# Patient Record
Sex: Female | Born: 1945 | Race: White | Hispanic: No | Smoking: Former smoker
Health system: Southern US, Community
[De-identification: ages and names within clinical notes are randomized; demographics above are authoritative.]

## PROBLEM LIST (undated history)

## (undated) DIAGNOSIS — K429 Umbilical hernia without obstruction or gangrene: Secondary | ICD-10-CM

## (undated) DIAGNOSIS — O2230 Deep phlebothrombosis in pregnancy, unspecified trimester: Secondary | ICD-10-CM

## (undated) DIAGNOSIS — I10 Essential (primary) hypertension: Secondary | ICD-10-CM

## (undated) DIAGNOSIS — I509 Heart failure, unspecified: Secondary | ICD-10-CM

## (undated) DIAGNOSIS — R0602 Shortness of breath: Secondary | ICD-10-CM

## (undated) DIAGNOSIS — I2699 Other pulmonary embolism without acute cor pulmonale: Secondary | ICD-10-CM

## (undated) DIAGNOSIS — R918 Other nonspecific abnormal finding of lung field: Secondary | ICD-10-CM

## (undated) DIAGNOSIS — J159 Unspecified bacterial pneumonia: Secondary | ICD-10-CM

## (undated) DIAGNOSIS — E785 Hyperlipidemia, unspecified: Secondary | ICD-10-CM

## (undated) DIAGNOSIS — J310 Chronic rhinitis: Secondary | ICD-10-CM

## (undated) DIAGNOSIS — F419 Anxiety disorder, unspecified: Secondary | ICD-10-CM

## (undated) DIAGNOSIS — Z923 Personal history of irradiation: Secondary | ICD-10-CM

## (undated) DIAGNOSIS — G4710 Hypersomnia, unspecified: Secondary | ICD-10-CM

## (undated) DIAGNOSIS — D649 Anemia, unspecified: Secondary | ICD-10-CM

## (undated) DIAGNOSIS — I4891 Unspecified atrial fibrillation: Secondary | ICD-10-CM

## (undated) DIAGNOSIS — J189 Pneumonia, unspecified organism: Secondary | ICD-10-CM

## (undated) DIAGNOSIS — K219 Gastro-esophageal reflux disease without esophagitis: Secondary | ICD-10-CM

## (undated) DIAGNOSIS — J449 Chronic obstructive pulmonary disease, unspecified: Secondary | ICD-10-CM

## (undated) DIAGNOSIS — G4730 Sleep apnea, unspecified: Secondary | ICD-10-CM

## (undated) DIAGNOSIS — E119 Type 2 diabetes mellitus without complications: Secondary | ICD-10-CM

## (undated) HISTORY — DX: Sleep apnea, unspecified: G47.30

## (undated) HISTORY — DX: Other nonspecific abnormal finding of lung field: R91.8

## (undated) HISTORY — DX: Hyperlipidemia, unspecified: E78.5

## (undated) HISTORY — DX: Chronic rhinitis: J31.0

## (undated) HISTORY — DX: Type 2 diabetes mellitus without complications: E11.9

## (undated) HISTORY — DX: Deep phlebothrombosis in pregnancy, unspecified trimester: O22.30

## (undated) HISTORY — DX: Essential (primary) hypertension: I10

## (undated) HISTORY — DX: Hypersomnia, unspecified: G47.10

## (undated) HISTORY — DX: Chronic obstructive pulmonary disease, unspecified: J44.9

## (undated) HISTORY — PX: VAGINAL HYSTERECTOMY: SUR661

---

## 2012-11-11 HISTORY — PX: BRONCHOSCOPY: SUR163

## 2012-11-11 HISTORY — PX: OTHER SURGICAL HISTORY: SHX169

## 2013-03-17 ENCOUNTER — Encounter: Payer: Self-pay | Admitting: Cardiothoracic Surgery

## 2013-03-17 ENCOUNTER — Encounter: Payer: Self-pay | Admitting: *Deleted

## 2013-03-17 DIAGNOSIS — O2230 Deep phlebothrombosis in pregnancy, unspecified trimester: Secondary | ICD-10-CM | POA: Insufficient documentation

## 2013-03-17 DIAGNOSIS — J310 Chronic rhinitis: Secondary | ICD-10-CM | POA: Insufficient documentation

## 2013-03-17 DIAGNOSIS — R918 Other nonspecific abnormal finding of lung field: Secondary | ICD-10-CM | POA: Insufficient documentation

## 2013-03-17 DIAGNOSIS — I10 Essential (primary) hypertension: Secondary | ICD-10-CM | POA: Insufficient documentation

## 2013-03-17 DIAGNOSIS — E119 Type 2 diabetes mellitus without complications: Secondary | ICD-10-CM | POA: Insufficient documentation

## 2013-03-17 DIAGNOSIS — J441 Chronic obstructive pulmonary disease with (acute) exacerbation: Secondary | ICD-10-CM | POA: Insufficient documentation

## 2013-03-17 DIAGNOSIS — G4730 Sleep apnea, unspecified: Secondary | ICD-10-CM

## 2013-03-17 DIAGNOSIS — E785 Hyperlipidemia, unspecified: Secondary | ICD-10-CM | POA: Insufficient documentation

## 2013-03-17 DIAGNOSIS — G4710 Hypersomnia, unspecified: Secondary | ICD-10-CM | POA: Insufficient documentation

## 2013-03-18 ENCOUNTER — Encounter: Payer: Self-pay | Admitting: Cardiothoracic Surgery

## 2013-03-18 ENCOUNTER — Institutional Professional Consult (permissible substitution) (INDEPENDENT_AMBULATORY_CARE_PROVIDER_SITE_OTHER): Payer: Medicare Other | Admitting: Cardiothoracic Surgery

## 2013-03-18 ENCOUNTER — Other Ambulatory Visit: Payer: Self-pay | Admitting: *Deleted

## 2013-03-18 VITALS — BP 186/80 | HR 69 | Resp 18 | Ht 61.0 in | Wt 200.0 lb

## 2013-03-18 DIAGNOSIS — R918 Other nonspecific abnormal finding of lung field: Secondary | ICD-10-CM

## 2013-03-18 NOTE — Progress Notes (Addendum)
[address]       [place] [address]             [phone]                    Anne Sanders [facility] Record #[id number] Date of Birth: 1945/10/24  Referring: Dr Gardiner Rhyme Primary Care: Dr Wende Neighbors  Chief Complaint:    Chief Complaint  Patient presents with  . Lung Lesion    left upper lobe, ?right middle lobe lesio.Marland KitchenMarland KitchenCT CHEST 10/23/12 AND 03/07/13 AT [facility] AND PET 10/29/12 AT [facility], PFT'S    History of Present Illness:    Anne Sanders 68 y.o. female is seen in the office  today for an enlarging left upper lobe lung nodule. The patient has more than a 50 year history of smoking she quit 2 years ago. She spent many years working in [employer]. She's had  admissions for congestive heart failure and was assessed ablation of COPD. She's been on continuous home oxygen for approximately 10 years. She is currently on xarelto because of recent bilateral DVT. She has a history of pulmonary emboli more than 30 years ago.  During the summer of 2014 the patient was seen and treated for right lung pneumonia, this cleared on chest x-ray in late August, however because of persistent opacity in the right lung radiology recommended a CT scan. Because of a left lung nodule and right lung collapse a PET scan was done and bronchoscopy. No definitive tissue diagnosis was obtained. Followup CT scan in 3 months is recommended which the patient had in January 2015. She now is referred by Dr. Alcide Clever for further evaluation and treatment of her lung nodules.     Current Activity/ Functional Status:  Patient is independent with mobility/ambulation, transfers, ADL's, IADL's. patient lives alone, drives her own car. She is on continuous oxygen, is able to do some housework but with chronic dyspnea with minimal activity.  Zubrod Score: At the time of surgery this patient's most appropriate activity status/level should be described as: []  Normal activity, no  symptoms []  Symptoms, fully ambulatory [x]  Symptoms, in bed less than or equal to 50% of the time []  Symptoms, in bed greater than 50% of the time but less than 100% []  Bedridden []  Moribund   Past Medical History  Diagnosis Date  . Hypertension   . DVT (deep vein thrombosis) in pregnancy   . COPD (chronic obstructive pulmonary disease)   . Hyperlipidemia   . Sleep apnea with hypersomnolence   . Chronic rhinitis   . Lung nodules   . Diabetes mellitus without complication     Past Surgical History  Procedure Laterality Date  . Bronchoscopy  11/11/12    negative pathology of right kung mass...Dr. Alcide Clever  . Transbronschial biopsy  11/11/12    right lung mass...neg path..Dr Alcide Clever    Family History  Problem Relation Age of Onset  . Heart disease Mother     History   Social History  . Marital Status: Widowed    Spouse Name: N/A    Number of Children: N/A  . Years of Education: N/A   Occupational History  . Worked in [employer] for many years   Social History Main Topics  . Smoking status: Former Smoker -- 1.50 packs/day for 30 years    Types: Cigarettes    Start date: 03/17/1961    Quit date: 03/17/2012  . Smokeless tobacco: Not on file  Comment: HX OF EXPOSURE TO DUSTS OR FUMES AT WORK  . Alcohol Use: No  . Drug Use: Not on file  . Sexual Activity: Not on file     History  Smoking status  . Former Smoker -- 1.50 packs/day for 30 years  . Types: Cigarettes  . Start date: 03/17/1961  . Quit date: 03/17/2012  Smokeless tobacco  . Not on file    Comment: HX OF EXPOSURE TO DUSTS OR FUMES AT WORK    History  Alcohol Use No     No Known Allergies  Current Outpatient Prescriptions  Medication Sig Dispense Refill  . budesonide (PULMICORT) 0.5 MG/2ML nebulizer solution Take 0.5 mg by nebulization daily as needed (for wheezing).       Marland Kitchen buPROPion (WELLBUTRIN SR) 150 MG 12 hr tablet Take 150 mg by mouth 2 (two) times daily.      . digoxin (LANOXIN)  0.125 MG tablet Take 0.125 mg by mouth 2 (two) times daily.       . fenofibrate micronized (LOFIBRA) 200 MG capsule Take 200 mg by mouth daily before breakfast.      . gabapentin (NEURONTIN) 100 MG capsule Take 100 mg by mouth 2 (two) times daily.       Marland Kitchen glimepiride (AMARYL) 4 MG tablet Take 4 mg by mouth 2 (two) times daily.      Marland Kitchen ipratropium-albuterol (DUONEB) 0.5-2.5 (3) MG/3ML SOLN Take 3 mLs by nebulization every 6 (six) hours as needed (for shortness of breath).       . Melatonin 300 MCG TABS Take 1 tablet by mouth at bedtime as needed (for sleep).       . metoprolol succinate (TOPROL-XL) 100 MG 24 hr tablet Take 100 mg by mouth daily. Take with or immediately following a meal.      . Multiple Vitamin (MULTIVITAMIN) tablet Take 1 tablet by mouth daily.      Marland Kitchen omeprazole (PRILOSEC) 20 MG capsule Take 20 mg by mouth 2 (two) times daily before a meal.      . pravastatin (PRAVACHOL) 80 MG tablet Take 80 mg by mouth at bedtime.      . Rivaroxaban (XARELTO) 20 MG TABS tablet Take 20 mg by mouth daily with supper.      Marland Kitchen Fe-Succ-C-Thre-B12-Des Stomach (FERROVITE PO) Take 1 tablet by mouth 2 (two) times daily.      . insulin NPH Human (HUMULIN N,NOVOLIN N) 100 UNIT/ML injection Inject 45 Units into the skin daily.      Marland Kitchen lisinopril (PRINIVIL,ZESTRIL) 2.5 MG tablet Take 2.5 mg by mouth 2 (two) times daily.      Marland Kitchen LORazepam (ATIVAN) 0.5 MG tablet Take 0.5 mg by mouth 2 (two) times daily as needed for anxiety or sleep.      . potassium chloride SA (K-DUR,KLOR-CON) 20 MEQ tablet Take 20 mEq by mouth daily.       No current facility-administered medications for this visit.     REVIEW OF SYSTEMS:   Review of Systems:     Cardiac Review of Systems: Y or N  Chest Pain [    ]  Resting SOB [ y  ] Exertional SOB  Blue.Reese  ]  Orthopnea [ y ]   Pedal Edema [ severe  ]    Palpitations [n  ] Syncope  [n  ]   Presyncope [ n  ]  General Review of Systems: [Y] = yes [  ]=no Constitional: recent weight change  [n  ];  Wt loss over the last 3 months [  0 ] anorexia [  ]; fatigue [ y ]; nausea [ n ]; night sweats [ n ]; fever [ n ]; or chills [ n ];          Dental: poor dentition[dentures  ]; Last Dentist visit:   Eye : blurred vision [  ]; diplopia [   ]; vision changes [  ];  Amaurosis fugax[  ]; Resp: cough Blue.Reese  ];  wheezing[ y ];  hemoptysis[ y ]; shortness of breath[y  ]; paroxysmal nocturnal dyspnea[ y ]; dyspnea on exertion[  y]; or orthopnea[ y ];  GI:  gallstones[ n ], vomiting[ n ];  dysphagia[ n ]; melena[n  ];  hematochezia [n  ]; heartburn[ y ];   Hx of  Colonoscopy[  ]; GU: kidney stones [  ]; hematuria[n  ];   dysuria [  ];  nocturia[  ];  history of     obstruction [  ]; urinary frequency [  ]             Skin: rash, swelling[  ];, hair loss[  ];  peripheral edema[  ];  or itching[  ]; Musculosketetal: myalgias[  ];  joint swelling[  ];  joint erythema[  ];  joint pain[ y ];  back pain[y  ];  Heme/Lymph: bruising[ y ];  bleeding[y  ];  anemia[  ];  Neuro: TIA[ n ];  headaches[  ];  stroke[n  ];  vertigo[  ];  seizures[  ];   paresthesias[  ];  difficulty walking[ y ];  Psych:depression[  ]; anxiety[  ];  Endocrine: diabetes[  ];  thyroid dysfunction[  ];  Immunizations: Flu up to date [  ]; Pneumococcal up to date [  ];  Other: Patient and daughter note she has troubles with her memory and is currently on medication for this  Physical Exam: BP 186/80  Pulse 69  Resp 18  Ht 5\' 1"  (1.549 m)  Wt 200 lb (90.719 kg)  BMI 37.81 kg/m2  SpO2 94%  PHYSICAL EXAMINATION:   General appearance: alert, cooperative, appears older than stated age, moderately obese and The patient is on continuous oxygen and dyspneic at rest and  just moving short distance Neurologic: intact Heart: regular rate and rhythm, S1, S2 normal, no murmur, click, rub or gallop Lungs: diminished breath sounds bilaterally and wheezes bilaterally Abdomen: soft, non-tender; bowel sounds normal; no masses,  no  organomegaly Extremities: Both lower extremities were severely edematous right greater than left with severe venous stasis changes bilaterally and unhealed venous stasis ulceration especially in the right leg Patient has no carotid bruits, no cervical supraclavicular or axillary adenopathy   Diagnostic Studies & Laboratory data:     Recent Radiology Findings:     CLINICAL DATA: Followup pulmonary nodules 03/07/2013 EXAM: CT CHEST WITHOUT CONTRAST TECHNIQUE: Multidetector CT imaging of the chest was performed following the standard protocol without IV contrast. COMPARISON: 10/23/2012 and PET scan 10/29/2012 FINDINGS: Sagittal images of the spine shows osteopenia and mild degenerative changes thoracic spine.  Atherosclerotic calcifications of thoracic aorta and coronary arteries are noted. Low-density nodule right adrenal gland is stable in size in appearance from prior exam.  Previous airspace disease in anterior medial aspect of the right upper lobe has resolved There is decrease in size in nodule in right upper lobe anteriorly measures 8 mm on the prior exam measures 1.3 cm. There is new elongated nodular density in right  upper lobe laterally measures about 9 mm. Attention should be given on follow-up examination. A nodule in left lower lobe laterally measures 5 mm stable in size in appearance from prior exam. There is a spiculated nodule in left upper lobe centrally measures 1.7 by 1.5 cm. On the prior exam measures 1.2 x 1 cm. This is consistent with malignancy as demonstrated hypermetabolic activity on the prior PET scan.  Surgical construct or biopsy is recommended. A calcified granuloma or hamartoma in left lower lobe posteriorly is stable in size in appearance from prior exam. Small hiatal hernia. Stable left adrenal nodule. Precarinal lymph node measures 1 cm stable in size in appearance. No hilar adenopathy.  IMPRESSION: 1. There is improvement in nodule and  airspace disease in right upper lobe anterior medially. There is a new somewhat elongated nodule in right upper lobe laterally measures 9 mm. 2. No hilar adenopathy. Stable borderline precarinal lymph node. 3. There is progression in size of a spiculated nodule in left upper lobe centrally measures 1.5 by 1.7 cm. This is consistent with malignancy. Surgical consult or biopsy is recommended. These results were called by telephone at the time of interpretation on 03/07/2013 at 2:40 PM to Dr. Gardiner Rhyme MD, who verbally acknowledged these results. Electronically Signed By: Lahoma Crocker M.D. On: 03/07/2013 14:43   RADIOLOGY REPORT* 10/23/2012 Clinical Data: Lung nodule, chest swelling. CT CHEST WITHOUT CONTRAST Technique: Multidetector CT imaging of the chest was performed following the standard protocol without IV contrast. Comparison: Chest radiograph 10/14/2012 and CT chest 06/01/2005.  Findings: Small nodular densities in the thyroid measure less than 1 cm in size. Sub-centimeter thyroid nodule(s) noted, too small to characterize, but most likely benign in the absence of known clinical risk factors for thyroid carcinoma.  Mediastinal lymph nodes measure up to 11 mm in the low right paratracheal station. Hilar regions are difficult to definitively evaluate without IV contrast. No axillary adenopathy. Extensive atherosclerotic calcification of the arterial vasculature, including coronary arteries. Heart is enlarged. No pericardial effusion.  Note is made that respiratory motion degrades image quality. There is a nodular lesion in the anterior segment right upper lobe, measuring approximately 9 x 13 mm, with adjacent collapse/consolidation medially (series 4, images 19-25). Subpleural scarring in the lateral right lower lobe. Possible tiny nodular density along the right hemidiaphragm (image 43).  An irregular nodule is seen in the apical left upper lobe, measuring 10 x 12 mm, new  from 06/01/2005. A calcified subpleural lesion in the left lower lobe is unchanged. Scattered subpleural nodular densities are faint (example image 32) and measure less than 4 mm in size. No pleural fluid. Airway is unremarkable.  Incidental imaging of the upper abdomen shows adrenal nodules bilaterally, measuring up to 1.1 x 1.7 cm on the right and 1.7 x 2.1 cm on the left. These are low in attenuation and stable from 06/01/2005. No worrisome lytic or sclerotic lesions.  IMPRESSION:  1. Irregular nodules in the upper lobes bilaterally, worrisome for synchronous primary bronchogenic carcinomas. These results will be called to the ordering clinician or representative by the Radiologist Assistant, and communication documented in the PACS Dashboard. 2. Borderline enlarged low right paratracheal lymph node. 3. Additional scattered nonspecific pulmonary nodular densities are small in size and can be reassessed on follow-up examination. Original Report Authenticated By: Lorin Picket, M.D.    EXAM: NUCLEAR MEDICINE PET SKULL BASE TO THIGH FASTING BLOOD GLUCOSE: Value: 86 mg/dl TECHNIQUE: 13.7 mCi F-18 FDG was injected intravenously. CT data was obtained  and used for attenuation correction and anatomic localization only. (This was not acquired as a diagnostic CT examination.) Additional exam technical data entered on technologist worksheet. The PET images were only acquired through the thorax. Imaging of the abdomen and pelvis could not be completed due to patient claustrophobia. COMPARISON: CT chest 10/23/2012 FINDINGS: NECK No hypermetabolic lymph nodes in the neck. CHEST Within the left upper lobe there, is a hypermetabolic nodule measuring 15 mm (image 36). This nodule has intense metabolic activity with SUV max = 8.9. There is a focus of consolidation within the medial aspect of the right middle lobe measuring 5.7 x 3.8 cm. Within the central portions consolidation there is  a focus of intense metabolic activity with this had SUV max 8.1(image 69 of the CT series and 56 fused series).. Within the left lower lobe is calcified nodule (image 131) measuring 14 mm which is not hypermetabolic.  Within the mediastinum there is a mildly enlarged prevascular lymph node measuring 13 mm (image 61) with mild metabolic activity equal or less than blood which is indeterminate. .  ABDOMEN/PELVIS  No PET data is available for the abdomen or pelvis. There is enlargement of the left adrenal gland to 23 mm. This nodule has low Hounsfield units on comparison CT consistent with a benign adenoma.  SKELETON  No evidence of hypermetabolic skeletal metastasis in the thorax. No evidence of sclerotic lesions in the abdomen or pelvis.  IMPRESSION: 1. Hypermetabolic left upper lobe pulmonary nodule is concerning for bronchogenic carcinoma. This may be amenable to percutaneous biopsy. 2. Consolidation within the right middle lobe. Differential includes infection versus neoplasm or a combination of both. This may be amenable to percutaneous biopsy or bronchoscopy. 3. Mild enlarged minimally hypermetabolic pre carinal lymph node is indeterminate and may well be reactive . 4. Left adrenal adenoma. 5. No metabolic FDG data is provided for the abdomen or pelvis. No gross evidence of metastasis on CT portion. Electronically Signed By: Suzy Bouchard M.D. On: 10/30/2012 11:03  Recent Lab Findings: No results found for this basename: WBC, HGB, HCT, PLT, GLUCOSE, CHOL, TRIG, HDL, LDLDIRECT, LDLCALC, ALT, AST, NA, K, CL, CREATININE, BUN, CO2, TSH, INR, GLUF, HGBA1C   PFTs dated September 2014 FEV1 1.4 63% predicted DLCO 7.5 735% predicted Serial PFT   2009 -2011 show a FEV1 between 1.1 and 0.84 and DLCO 4.82-3.04  Assessment / Plan:     1.) spiculated nodule in left upper lobe centrally measures 1.7 by 1.5 cm enlarged slightly since September but definitely more dense  radiographically, highly suspicious for malignancy. The collapse in the right middle lobe has improved on followup scans but there are nodularities in the right chest there too small to characterize. 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 With her poor medical condition and severe COPD oxygen dependent I will not think she will be a resection candidate. I recommended to her we obtain her most recent CT scan data and consider bronchoscopy with ebus and electromagnetic navigation bronchoscopy with biopsy to obtain diagnosis. If tissue diagnosis confirms our clinical suspicion she may be a candidate for stereotactic radio therapy treatment. Patient is agreeable with this  2.) Currently on anticoagulation with xarelto because of severe venous stasis changes and recent right leg DVT, with history of recent DVT and severe venous stasis changes in the  lower extremity we'll plan to stop her xarelto with a Lovenox bridge pending surgical biopsy.  3 )Severe COPD on home oxygen and limited exercise tolerance    I spent 60 minutes counseling the patient face to face. The total time spent in the appointment was 80 minutes. Portions of this note have been generated with the assistance of voice recognition software. I apologize for any typographical/grammatical errors that may be present in this note as it has not been reviewed by a professional transcriptionist.     Grace Isaac MD      [address] [place] [address] Office [phone]   Beeper [phone]  03/19/2013 1:37 PM

## 2013-03-19 ENCOUNTER — Encounter (HOSPITAL_COMMUNITY): Payer: Self-pay | Admitting: Pharmacy Technician

## 2013-03-20 ENCOUNTER — Other Ambulatory Visit (HOSPITAL_COMMUNITY): Payer: Self-pay | Admitting: *Deleted

## 2013-03-20 ENCOUNTER — Other Ambulatory Visit: Payer: Self-pay | Admitting: *Deleted

## 2013-03-20 ENCOUNTER — Encounter (HOSPITAL_COMMUNITY): Payer: Self-pay

## 2013-03-20 ENCOUNTER — Encounter (HOSPITAL_COMMUNITY)
Admission: RE | Admit: 2013-03-20 | Discharge: 2013-03-20 | Disposition: A | Discharge status: Home / Self Care | Payer: Medicare Other | Source: Ambulatory Visit | Attending: Cardiothoracic Surgery | Admitting: Cardiothoracic Surgery

## 2013-03-20 VITALS — BP 165/82 | HR 70 | Temp 97.2°F | Resp 18 | Wt 200.6 lb

## 2013-03-20 DIAGNOSIS — R918 Other nonspecific abnormal finding of lung field: Secondary | ICD-10-CM

## 2013-03-20 DIAGNOSIS — Z86718 Personal history of other venous thrombosis and embolism: Secondary | ICD-10-CM

## 2013-03-20 DIAGNOSIS — Z01810 Encounter for preprocedural cardiovascular examination: Secondary | ICD-10-CM | POA: Insufficient documentation

## 2013-03-20 DIAGNOSIS — Z01818 Encounter for other preprocedural examination: Secondary | ICD-10-CM | POA: Insufficient documentation

## 2013-03-20 DIAGNOSIS — Z01812 Encounter for preprocedural laboratory examination: Secondary | ICD-10-CM | POA: Insufficient documentation

## 2013-03-20 HISTORY — DX: Anxiety disorder, unspecified: F41.9

## 2013-03-20 HISTORY — DX: Pneumonia, unspecified organism: J18.9

## 2013-03-20 HISTORY — DX: Unspecified atrial fibrillation: I48.91

## 2013-03-20 HISTORY — DX: Gastro-esophageal reflux disease without esophagitis: K21.9

## 2013-03-20 HISTORY — DX: Anemia, unspecified: D64.9

## 2013-03-20 HISTORY — DX: Shortness of breath: R06.02

## 2013-03-20 HISTORY — DX: Umbilical hernia without obstruction or gangrene: K42.9

## 2013-03-20 HISTORY — DX: Heart failure, unspecified: I50.9

## 2013-03-20 HISTORY — DX: Other pulmonary embolism without acute cor pulmonale: I26.99

## 2013-03-20 LAB — COMPREHENSIVE METABOLIC PANEL
ALT: 19 U/L (ref 0–35)
AST: 22 U/L (ref 0–37)
Albumin: 3.2 g/dL — ABNORMAL LOW (ref 3.5–5.2)
Alkaline Phosphatase: 66 U/L (ref 39–117)
BUN: 37 mg/dL — ABNORMAL HIGH (ref 6–23)
CO2: 29 mEq/L (ref 19–32)
Calcium: 9.2 mg/dL (ref 8.4–10.5)
Chloride: 96 mEq/L (ref 96–112)
Creatinine, Ser: 1.46 mg/dL — ABNORMAL HIGH (ref 0.50–1.10)
GFR calc Af Amer: 42 mL/min — ABNORMAL LOW (ref >90)
GFR calc non Af Amer: 36 mL/min — ABNORMAL LOW (ref >90)
Glucose, Bld: 322 mg/dL — ABNORMAL HIGH (ref 70–99)
Potassium: 4.7 mEq/L (ref 3.7–5.3)
Sodium: 138 mEq/L (ref 137–147)
Total Bilirubin: 0.3 mg/dL (ref 0.3–1.2)
Total Protein: 7.6 g/dL (ref 6.0–8.3)

## 2013-03-20 LAB — CBC
HCT: 35.9 % — ABNORMAL LOW (ref 36.0–46.0)
Hemoglobin: 11.6 g/dL — ABNORMAL LOW (ref 12.0–15.0)
MCH: 28.4 pg (ref 26.0–34.0)
MCHC: 32.3 g/dL (ref 30.0–36.0)
MCV: 88 fL (ref 78.0–100.0)
Platelets: 130 10*3/uL — ABNORMAL LOW (ref 150–400)
RBC: 4.08 MIL/uL (ref 3.87–5.11)
RDW: 15.8 % — ABNORMAL HIGH (ref 11.5–15.5)
WBC: 6.9 10*3/uL (ref 4.0–10.5)

## 2013-03-20 LAB — PROTIME-INR
INR: 1.33 (ref 0.00–1.49)
Prothrombin Time: 16.2 seconds — ABNORMAL HIGH (ref 11.6–15.2)

## 2013-03-20 LAB — APTT: aPTT: 36 seconds (ref 24–37)

## 2013-03-20 MED ORDER — ENOXAPARIN SODIUM 150 MG/ML ~~LOC~~ SOLN: 130.0000 mg | SUBCUTANEOUS | Status: DC

## 2013-03-20 NOTE — Pre-Procedure Instructions (Signed)
Anne Sanders  03/20/2013   Your procedure is scheduled on:  Tuesday, March 25, 2013 at 12:10 PM.   Report to [facility] Entrance "A" Admitting Office at 8:30 AM.   Call this number if you have problems the morning of surgery: [phone]   Remember:   Do not eat food or drink liquids after midnight Monday, 03/24/13.   Take these medicines the morning of surgery with A SIP OF WATER: buPROPion (WELLBUTRIN SR), digoxin (LANOXIN), gabapentin (NEURONTIN), omeprazole (PRILOSEC), metoprolol succinate (TOPROL-XL), LORazepam (ATIVAN) - if needed, ipratropium-albuterol (DUONEB) - if needed, budesonide (PULMICORT) - if needed (please bring inhaler with you day of surgery).  Stop all Vitamins, Herbal Medications as of today, 03/21/13. Follow physician's instructions on Xarelto and Lovenox.  Do not take any of your diabetic medicines the day of surgery.     Do not wear jewelry, make-up or nail polish.  Do not wear lotions, powders, or perfumes. You may wear deodorant.  Do not shave 48 hours prior to surgery.   Do not bring valuables to the hospital.  [facility] is not responsible                  for any belongings or valuables.               Contacts, dentures or bridgework may not be worn into surgery.  Leave suitcase in the car. After surgery it may be brought to your room.  For patients admitted to the hospital, discharge time is determined by your                treatment team.   Will Need to have someone drive you home after your procedure. You are not allowed to drive home.           Special Instructions: Shower using CHG 2 nights before surgery and the night before surgery.  If you shower the day of surgery use CHG.  Use special wash - you have one bottle of CHG for all showers.  You should use approximately 1/3 of the bottle for each shower.   Please read over the following fact sheets that you were given: Pain Booklet, Coughing and Deep Breathing and Surgical Site Infection  Prevention

## 2013-03-20 NOTE — Progress Notes (Signed)
Pt's daughter states that she had just received information about pt stopping Xarelto and starting a Lovenox bridge. I instructed her to follow the physician's orders for that.

## 2013-03-21 ENCOUNTER — Encounter (HOSPITAL_COMMUNITY): Payer: Self-pay

## 2013-03-21 NOTE — Progress Notes (Signed)
Anesthesia Chart Review:  Patient is a 68 year old female scheduled for video bronchoscopy with endobronchial navigation on 03/25/13 by Dr. Servando Snare. During the summer of 2014 the patient was seen and treated for right lung pneumonia, this cleared on chest x-ray in late August, however because of persistent opacity in the right lung radiology recommended a CT scan. Because of a left lung nodule and right lung collapse a PET scan was done and bronchoscopy. No definitive tissue diagnosis was obtained. Followup CT scan in 3 months is recommended which the patient had in January 2015. She was then referred to [facility] by Dr. Alcide Clever for further evaluation and treatment of her lung nodules.  Other history includes former smoker, bilateral DVT '14, remote history of PE, obesity,  HTN, HLD, OSA, DM2, COPD with home O2, anxiety, CHF, GERD, anemia. She also has a history of afib according to her PCP notes, although she was not aware of this.  PCP is Dr. Wende Neighbors with [facility]  Pulmonologist is Dr. Gardiner Rhyme.    EKG on 03/20/13 showed afib @ 63 bpm, non-specific intra-ventricular conduction block, cannot rule out anteroseptal infarct (age undetermined), T wave abnormality, consider inferior ischemia.  IVCD is new and inferior T wave abnormality is more pronounced when compared to EKG on 12/25/11 which also showed afib.  She denied prior stress and echo, and there was no record of these at [facility].  However, I did receive records from [facility] that showed a cardiology evaluation by Dr. Jyl Heinz on 09/18/11 for afib in the setting of sepsis (positive blood cultures).  She had an echo on 09/18/11 that showed undetermined rhythm, LV size, wall thickness and systolic function are normal with EF greater than 55%. RV is mildly enlarged. LV is mildly dilated. Mild aortic valve sclerosis without stenosis. Trace to mild mitral regurgitation. Mild to moderate tricuspid regurgitation. Mild to moderate  pulmonary hypertension. Trivial pericardial effusion.   By [facility] records, PFTs dated September 2014 FEV1 1.4 63% predicted DLCO 7.5 735% predicted.   Preoperative labs noted.  BUN/Cr 37/1.46.  Glucose is 322.  H/H 11.6/35.9, PLT 130K.  PT 16.2, INR 1.33, PTT 36.    She is for a CXR on the day of surgery.  Due to her recent history of DVT and severe stasis changes in her LE, Dr. Servando Snare is having her start a Lovenox bridge while Xarelto is on hold for surgery. Chart reviewed with Dr. Servando Snare (since she did not report history of afib to him) and anesthesiologist Dr. Tobias Alexander.  It's unclear if patient has been in afib since 08/2011 or if this is recurrent.  She is on digoxin and Toprol with good rate control.  She has been on Xarelto and will be on a Lovenox bridge preoperatively.  Echo was done at the time of her initial diagnosis of afib.  She will get a fasting CBG on arrival.  Dr. Servando Snare does not feel patient will be a candidate for larger procedure like thoracotomy.  She tolerated previous bronchoscopy with biopsy in [place], but unfortunately no definite tissue diagnosis.  If glucose is reasonable and afib remains rate controlled it is anticipated that she can proceed as planned.  George Hugh [facility] Short Stay Center/Anesthesiology Phone [phone] 03/21/2013 3:14 PM

## 2013-03-24 ENCOUNTER — Telehealth: Payer: Self-pay | Admitting: *Deleted

## 2013-03-24 NOTE — Telephone Encounter (Signed)
Patient's daughter called to see what time she needed to bring her mom to the hospital for surgery tomorrow (2/3).  I let her know to be there at 8:30am.  No further questions.  Told her to call our office if anything came up.

## 2013-03-24 NOTE — Progress Notes (Signed)
Pt notified of time change;to arrive at 0730-verbalized understanding

## 2013-03-25 ENCOUNTER — Encounter (HOSPITAL_COMMUNITY): Payer: Medicare Other | Admitting: Vascular Surgery

## 2013-03-25 ENCOUNTER — Encounter (HOSPITAL_COMMUNITY): Payer: Self-pay | Admitting: *Deleted

## 2013-03-25 ENCOUNTER — Ambulatory Visit (HOSPITAL_COMMUNITY): Payer: Medicare Other

## 2013-03-25 ENCOUNTER — Ambulatory Visit (HOSPITAL_COMMUNITY): Payer: Medicare Other | Admitting: Anesthesiology

## 2013-03-25 ENCOUNTER — Ambulatory Visit (HOSPITAL_COMMUNITY)
Admission: RE | Admit: 2013-03-25 | Discharge: 2013-03-25 | Disposition: A | Discharge status: Home / Self Care | Payer: Medicare Other | Source: Ambulatory Visit | Attending: Cardiothoracic Surgery | Admitting: Cardiothoracic Surgery

## 2013-03-25 ENCOUNTER — Encounter (HOSPITAL_COMMUNITY)
Admission: RE | Disposition: A | Discharge status: Home / Self Care | Payer: Self-pay | Source: Ambulatory Visit | Attending: Cardiothoracic Surgery

## 2013-03-25 DIAGNOSIS — R918 Other nonspecific abnormal finding of lung field: Secondary | ICD-10-CM

## 2013-03-25 DIAGNOSIS — K219 Gastro-esophageal reflux disease without esophagitis: Secondary | ICD-10-CM | POA: Insufficient documentation

## 2013-03-25 DIAGNOSIS — Z9981 Dependence on supplemental oxygen: Secondary | ICD-10-CM | POA: Insufficient documentation

## 2013-03-25 DIAGNOSIS — D381 Neoplasm of uncertain behavior of trachea, bronchus and lung: Secondary | ICD-10-CM

## 2013-03-25 DIAGNOSIS — E119 Type 2 diabetes mellitus without complications: Secondary | ICD-10-CM | POA: Insufficient documentation

## 2013-03-25 DIAGNOSIS — F411 Generalized anxiety disorder: Secondary | ICD-10-CM | POA: Insufficient documentation

## 2013-03-25 DIAGNOSIS — J449 Chronic obstructive pulmonary disease, unspecified: Secondary | ICD-10-CM | POA: Insufficient documentation

## 2013-03-25 DIAGNOSIS — I509 Heart failure, unspecified: Secondary | ICD-10-CM | POA: Insufficient documentation

## 2013-03-25 DIAGNOSIS — N289 Disorder of kidney and ureter, unspecified: Secondary | ICD-10-CM | POA: Insufficient documentation

## 2013-03-25 DIAGNOSIS — Z7901 Long term (current) use of anticoagulants: Secondary | ICD-10-CM | POA: Insufficient documentation

## 2013-03-25 DIAGNOSIS — C3410 Malignant neoplasm of upper lobe, unspecified bronchus or lung: Secondary | ICD-10-CM | POA: Insufficient documentation

## 2013-03-25 DIAGNOSIS — E785 Hyperlipidemia, unspecified: Secondary | ICD-10-CM | POA: Insufficient documentation

## 2013-03-25 DIAGNOSIS — Z86711 Personal history of pulmonary embolism: Secondary | ICD-10-CM | POA: Insufficient documentation

## 2013-03-25 DIAGNOSIS — G4730 Sleep apnea, unspecified: Secondary | ICD-10-CM | POA: Insufficient documentation

## 2013-03-25 DIAGNOSIS — J4489 Other specified chronic obstructive pulmonary disease: Secondary | ICD-10-CM | POA: Insufficient documentation

## 2013-03-25 DIAGNOSIS — I10 Essential (primary) hypertension: Secondary | ICD-10-CM | POA: Insufficient documentation

## 2013-03-25 DIAGNOSIS — Z87891 Personal history of nicotine dependence: Secondary | ICD-10-CM | POA: Insufficient documentation

## 2013-03-25 DIAGNOSIS — Z77098 Contact with and (suspected) exposure to other hazardous, chiefly nonmedicinal, chemicals: Secondary | ICD-10-CM | POA: Insufficient documentation

## 2013-03-25 DIAGNOSIS — Z86718 Personal history of other venous thrombosis and embolism: Secondary | ICD-10-CM | POA: Insufficient documentation

## 2013-03-25 HISTORY — PX: VIDEO BRONCHOSCOPY WITH ENDOBRONCHIAL NAVIGATION: SHX6175

## 2013-03-25 HISTORY — PX: VIDEO BRONCHOSCOPY WITH ENDOBRONCHIAL ULTRASOUND: SHX6177

## 2013-03-25 LAB — GLUCOSE, CAPILLARY
Glucose-Capillary: 195 mg/dL — ABNORMAL HIGH (ref 70–99)
Glucose-Capillary: 197 mg/dL — ABNORMAL HIGH (ref 70–99)
Glucose-Capillary: 182 mg/dL — ABNORMAL HIGH (ref 70–99)
Glucose-Capillary: 151 mg/dL — ABNORMAL HIGH (ref 70–99)
Glucose-Capillary: 166 mg/dL — ABNORMAL HIGH (ref 70–99)

## 2013-03-25 SURGERY — VIDEO BRONCHOSCOPY WITH ENDOBRONCHIAL NAVIGATION
Anesthesia: General

## 2013-03-25 MED ORDER — PHENYLEPHRINE 40 MCG/ML (10ML) SYRINGE FOR IV PUSH (FOR BLOOD PRESSURE SUPPORT)
PREFILLED_SYRINGE | INTRAVENOUS | Status: AC
  Filled 2013-03-25: qty 10

## 2013-03-25 MED ORDER — LIDOCAINE HCL (CARDIAC) 20 MG/ML IV SOLN
INTRAVENOUS | Status: AC
  Filled 2013-03-25: qty 5

## 2013-03-25 MED ORDER — LIDOCAINE HCL (CARDIAC) 20 MG/ML IV SOLN
INTRAVENOUS | Status: DC | PRN
  Administered 2013-03-25: 60 mg via INTRAVENOUS

## 2013-03-25 MED ORDER — LACTATED RINGERS IV SOLN
INTRAVENOUS | Status: DC
  Administered 2013-03-25 (×2): via INTRAVENOUS

## 2013-03-25 MED ORDER — MIDAZOLAM HCL 2 MG/2ML IJ SOLN
INTRAMUSCULAR | Status: AC
  Filled 2013-03-25: qty 2

## 2013-03-25 MED ORDER — SUCCINYLCHOLINE CHLORIDE 20 MG/ML IJ SOLN
INTRAMUSCULAR | Status: DC | PRN
  Administered 2013-03-25: 120 mg via INTRAVENOUS

## 2013-03-25 MED ORDER — PROPOFOL 10 MG/ML IV BOLUS
INTRAVENOUS | Status: AC
Start: 2013-03-25 — End: 2013-03-25
  Filled 2013-03-25: qty 20

## 2013-03-25 MED ORDER — ONDANSETRON HCL 4 MG/2ML IJ SOLN
INTRAMUSCULAR | Status: AC
  Administered 2013-03-25: 4 mg via INTRAVENOUS
  Filled 2013-03-25: qty 2

## 2013-03-25 MED ORDER — FENTANYL CITRATE 0.05 MG/ML IJ SOLN
INTRAMUSCULAR | Status: DC | PRN
  Administered 2013-03-25: 50 ug via INTRAVENOUS

## 2013-03-25 MED ORDER — ROCURONIUM BROMIDE 50 MG/5ML IV SOLN
INTRAVENOUS | Status: AC
  Filled 2013-03-25: qty 1

## 2013-03-25 MED ORDER — GLYCOPYRROLATE 0.2 MG/ML IJ SOLN
INTRAMUSCULAR | Status: DC | PRN
  Administered 2013-03-25: 0.4 mg via INTRAVENOUS

## 2013-03-25 MED ORDER — MIDAZOLAM HCL 5 MG/5ML IJ SOLN
INTRAMUSCULAR | Status: DC | PRN
  Administered 2013-03-25: 1 mg via INTRAVENOUS

## 2013-03-25 MED ORDER — ONDANSETRON HCL 4 MG/2ML IJ SOLN
INTRAMUSCULAR | Status: AC
  Filled 2013-03-25: qty 2

## 2013-03-25 MED ORDER — ROCURONIUM BROMIDE 100 MG/10ML IV SOLN
INTRAVENOUS | Status: DC | PRN
  Administered 2013-03-25: 20 mg via INTRAVENOUS

## 2013-03-25 MED ORDER — FENTANYL CITRATE 0.05 MG/ML IJ SOLN
INTRAMUSCULAR | Status: AC
  Filled 2013-03-25: qty 5

## 2013-03-25 MED ORDER — FENTANYL CITRATE 0.05 MG/ML IJ SOLN: 25.0000 ug | INTRAMUSCULAR | Status: DC | PRN

## 2013-03-25 MED ORDER — OXYCODONE HCL 5 MG/5ML PO SOLN: 5.0000 mg | Freq: Once | ORAL | Status: DC | PRN

## 2013-03-25 MED ORDER — PROPOFOL 10 MG/ML IV BOLUS
INTRAVENOUS | Status: DC | PRN
  Administered 2013-03-25: 120 mg via INTRAVENOUS
  Administered 2013-03-25: 20 mg via INTRAVENOUS

## 2013-03-25 MED ORDER — NEOSTIGMINE METHYLSULFATE 1 MG/ML IJ SOLN
INTRAMUSCULAR | Status: DC | PRN
  Administered 2013-03-25: 3 mg via INTRAVENOUS

## 2013-03-25 MED ORDER — SUCCINYLCHOLINE CHLORIDE 20 MG/ML IJ SOLN
INTRAMUSCULAR | Status: AC
  Filled 2013-03-25: qty 1

## 2013-03-25 MED ORDER — OXYCODONE HCL 5 MG PO TABS: 5.0000 mg | ORAL_TABLET | Freq: Once | ORAL | Status: DC | PRN

## 2013-03-25 MED ORDER — PHENYLEPHRINE HCL 10 MG/ML IJ SOLN
INTRAMUSCULAR | Status: DC | PRN
  Administered 2013-03-25: 80 ug via INTRAVENOUS

## 2013-03-25 MED ORDER — 0.9 % SODIUM CHLORIDE (POUR BTL) OPTIME
TOPICAL | Status: DC | PRN
  Administered 2013-03-25: 1000 mL

## 2013-03-25 MED ORDER — DEXTROSE 5 % IV SOLN
1.5000 g | INTRAVENOUS | Status: AC
  Administered 2013-03-25: 1.5 g via INTRAVENOUS
  Filled 2013-03-25: qty 1.5

## 2013-03-25 MED ORDER — ONDANSETRON HCL 4 MG/2ML IJ SOLN
INTRAMUSCULAR | Status: DC | PRN
  Administered 2013-03-25: 4 mg via INTRAVENOUS

## 2013-03-25 MED ORDER — NEOSTIGMINE METHYLSULFATE 1 MG/ML IJ SOLN
INTRAMUSCULAR | Status: AC
  Filled 2013-03-25: qty 10

## 2013-03-25 MED ORDER — GLYCOPYRROLATE 0.2 MG/ML IJ SOLN
INTRAMUSCULAR | Status: AC
  Filled 2013-03-25: qty 2

## 2013-03-25 MED ORDER — ONDANSETRON HCL 4 MG/2ML IJ SOLN
4.0000 mg | Freq: Four times a day (QID) | INTRAMUSCULAR | Status: AC | PRN
  Administered 2013-03-25: 4 mg via INTRAVENOUS

## 2013-03-25 SURGICAL SUPPLY — 37 items
BRUSH CYTOL CELLEBRITY 1.5X140 (MISCELLANEOUS) IMPLANT
BRUSH SUPERTRAX BIOPSY (INSTRUMENTS) IMPLANT
BRUSH SUPERTRAX NDL-TIP CYTO (INSTRUMENTS) IMPLANT
CANISTER SUCTION 2500CC (MISCELLANEOUS) ×4 IMPLANT
CHANNEL WORK EXTEND EDGE 180 (KITS) IMPLANT
CHANNEL WORK EXTEND EDGE 45 (KITS) IMPLANT
CHANNEL WORK EXTEND EDGE 90 (KITS) IMPLANT
CONT SPEC 4OZ CLIKSEAL STRL BL (MISCELLANEOUS) ×8 IMPLANT
COTTONBALL LRG STERILE PKG (GAUZE/BANDAGES/DRESSINGS) IMPLANT
COVER TABLE BACK 60X90 (DRAPES) ×4 IMPLANT
DRSG AQUACEL AG ADV 3.5X14 (GAUZE/BANDAGES/DRESSINGS) ×2 IMPLANT
FILTER STRAW FLUID ASPIR (MISCELLANEOUS) IMPLANT
FORCEPS BIOP RJ4 1.8 (CUTTING FORCEPS) IMPLANT
FORCEPS BIOP SUPERTRX PREMAR (INSTRUMENTS) ×2 IMPLANT
GLOVE BIO SURGEON STRL SZ 6.5 (GLOVE) ×4 IMPLANT
KIT PROCEDURE EDGE 180 (KITS) IMPLANT
KIT PROCEDURE EDGE 45 (KITS) IMPLANT
KIT PROCEDURE EDGE 90 (KITS) ×2 IMPLANT
KIT ROOM TURNOVER OR (KITS) ×4 IMPLANT
MARKER SKIN DUAL TIP RULER LAB (MISCELLANEOUS) ×4 IMPLANT
NEEDLE 22X1 1/2 (OR ONLY) (NEEDLE) IMPLANT
NEEDLE BIOPSY TRANSBRONCH 21G (NEEDLE) IMPLANT
NEEDLE BLUNT 18X1 FOR OR ONLY (NEEDLE) IMPLANT
NEEDLE SUPERTRX PREMARK BIOPSY (NEEDLE) ×2 IMPLANT
NEEDLE SYS SONOTIP II EBUSTBNA (NEEDLE) ×2 IMPLANT
NS IRRIG 1000ML POUR BTL (IV SOLUTION) ×4 IMPLANT
OIL SILICONE PENTAX (PARTS (SERVICE/REPAIRS)) ×4 IMPLANT
PAD ARMBOARD 7.5X6 YLW CONV (MISCELLANEOUS) ×8 IMPLANT
PATCHES PATIENT (LABEL) ×6 IMPLANT
SPONGE GAUZE 4X4 12PLY (GAUZE/BANDAGES/DRESSINGS) ×2 IMPLANT
SYR 20CC LL (SYRINGE) ×2 IMPLANT
SYR 20ML ECCENTRIC (SYRINGE) ×4 IMPLANT
SYR 5ML LUER SLIP (SYRINGE) ×2 IMPLANT
SYR CONTROL 10ML LL (SYRINGE) IMPLANT
TOWEL OR 17X24 6PK STRL BLUE (TOWEL DISPOSABLE) ×4 IMPLANT
TRAP SPECIMEN MUCOUS 40CC (MISCELLANEOUS) ×4 IMPLANT
TUBE CONNECTING 12X1/4 (SUCTIONS) ×4 IMPLANT

## 2013-03-25 NOTE — Transfer of Care (Signed)
Immediate Anesthesia Transfer of Care Note  Patient: Anne Sanders  Procedure(s) Performed: Procedure(s): VIDEO BRONCHOSCOPY WITH ENDOBRONCHIAL NAVIGATION (N/A) VIDEO BRONCHOSCOPY WITH ENDOBRONCHIAL ULTRASOUND (N/A)  Patient Location: PACU  Anesthesia Type:General  Level of Consciousness: awake, alert  and oriented  Airway & Oxygen Therapy: Patient Spontanous Breathing and Patient connected to nasal cannula oxygen  Post-op Assessment: Report given to PACU RN and Post -op Vital signs reviewed and stable  Post vital signs: Reviewed and stable  Complications: No apparent anesthesia complications

## 2013-03-25 NOTE — Progress Notes (Signed)
Lower legs dry and discolored.  Skin fragile and thin skin tear noted to right lower leg.

## 2013-03-25 NOTE — Anesthesia Preprocedure Evaluation (Signed)
Anesthesia Evaluation  Patient identified by MRN, date of birth, ID band Patient awake    Reviewed: Allergy & Precautions, H&P , NPO status , Patient's Chart, lab work & pertinent test results  Airway Mallampati: II  Neck ROM: full    Dental   Pulmonary COPDformer smoker,          Cardiovascular hypertension, +CHF     Neuro/Psych    GI/Hepatic GERD-  ,  Endo/Other  diabetes, Type 2Morbid obesity  Renal/GU Renal InsufficiencyRenal disease     Musculoskeletal   Abdominal   Peds  Hematology  (+) anemia ,   Anesthesia Other Findings   Reproductive/Obstetrics                           Anesthesia Physical Anesthesia Plan  ASA: III  Anesthesia Plan: General   Post-op Pain Management:    Induction: Intravenous  Airway Management Planned: Oral ETT  Additional Equipment:   Intra-op Plan:   Post-operative Plan: Extubation in OR  Informed Consent: I have reviewed the patients History and Physical, chart, labs and discussed the procedure including the risks, benefits and alternatives for the proposed anesthesia with the patient or authorized representative who has indicated his/her understanding and acceptance.     Plan Discussed with: CRNA, Anesthesiologist and Surgeon  Anesthesia Plan Comments:         Anesthesia Quick Evaluation

## 2013-03-25 NOTE — H&P (Signed)
[address]       [place] [address]             Eugenio Saenz Medical Record #[id number] Date of Birth: 06-20-1945  Referring: Dr Gardiner Rhyme Primary Care: Dr Wende Neighbors  Chief Complaint:    Left lung mass   History of Present Illness:    Anne Sanders 68 y.o. female is seen in the office  today for an enlarging left upper lobe lung nodule. The patient has more than a 50 year history of smoking she quit 2 years ago. She spent many years working in [employer]. She's had  admissions for congestive heart failure and was assessed ablation of COPD. She's been on continuous home oxygen for approximately 10 years. She is currently on xarelto because of recent bilateral DVT. She has a history of pulmonary emboli more than 30 years ago.  During the summer of 2014 the patient was seen and treated for right lung pneumonia, this cleared on chest x-ray in late August, however because of persistent opacity in the right lung radiology recommended a CT scan. Because of a left lung nodule and right lung collapse a PET scan was done and bronchoscopy. No definitive tissue diagnosis was obtained. Followup CT scan in 3 months is recommended which the patient had in January 2015. She now is referred by Dr. Alcide Clever for further evaluation and treatment of her lung nodules.     Current Activity/ Functional Status:  Patient is independent with mobility/ambulation, transfers, ADL's, IADL's. patient lives alone, drives her own car. She is on continuous oxygen, is able to do some housework but with chronic dyspnea with minimal activity.  Zubrod Score: At the time of surgery this patient's most appropriate activity status/level should be described as: []  Normal activity, no symptoms []  Symptoms, fully ambulatory [x]  Symptoms, in bed less than or equal to 50% of the time []  Symptoms, in bed greater than 50% of the time but less than  100% []  Bedridden []  Moribund   Past Medical History  Diagnosis Date  . Hypertension   . DVT (deep vein thrombosis) in pregnancy   . Hyperlipidemia   . Sleep apnea with hypersomnolence   . Chronic rhinitis   . Lung nodules   . Diabetes mellitus without complication   . COPD (chronic obstructive pulmonary disease)     uses 1L of O2 all the time  . Shortness of breath     with exertion  . Pneumonia   . Anxiety   . Pulmonary embolism   . CHF (congestive heart failure)   . GERD (gastroesophageal reflux disease)   . Umbilical hernia   . Anemia   . A-fib     03/20/13; patient was unaware of diagnosis, but hx noted on her 01/22/13 PCP records (Dr. Micheal Likens); saw cardiologist Dr. Geraldo Pitter 08/2011 for afib in the setting of sepsis at [facility]    Past Surgical History  Procedure Laterality Date  . Bronchoscopy  11/11/12    negative pathology of right kung mass...Dr. Alcide Clever  . Transbronschial biopsy  11/11/12    right lung mass...neg path..Dr Alcide Clever  . Vaginal hysterectomy      still has both ovaries and tubes    Family History  Problem Relation Age of Onset  . Heart disease Mother     History   Social  History  . Marital Status: Widowed    Spouse Name: N/A    Number of Children: N/A  . Years of Education: N/A   Occupational History  . Worked in [employer] for many years   Social History Main Topics  . Smoking status: Former Smoker -- 1.50 packs/day for 30 years    Types: Cigarettes    Start date: 03/17/1961    Quit date: 03/17/2012  . Smokeless tobacco: Not on file     Comment: HX OF EXPOSURE TO DUSTS OR FUMES AT WORK  . Alcohol Use: No  . Drug Use: Not on file  . Sexual Activity: Not on file     History  Smoking status  . Former Smoker -- 1.50 packs/day for 30 years  . Types: Cigarettes  . Start date: 03/17/1961  . Quit date: 03/17/2012  Smokeless tobacco  . Former Systems developer  . Types: Snuff    Comment: HX OF EXPOSURE TO DUSTS OR FUMES AT WORK     History  Alcohol Use No     No Known Allergies  Current Facility-Administered Medications  Medication Dose Route Frequency Provider Last Rate Last Dose  . lactated ringers infusion   Intravenous Continuous Albertha Ghee, MD 10 mL/hr at 03/25/13 [phone]       REVIEW OF SYSTEMS:   Review of Systems:     Cardiac Review of Systems: Y or N  Chest Pain [    ]  Resting SOB [ y  ] Exertional SOB  Blue.Reese  ]  Orthopnea [ y ]   Pedal Edema [ severe  ]    Palpitations [n  ] Syncope  [n  ]   Presyncope [ n  ]  General Review of Systems: [Y] = yes [  ]=no Constitional: recent weight change [n  ];  Wt loss over the last 3 months [  0 ] anorexia [  ]; fatigue [ y ]; nausea [ n ]; night sweats [ n ]; fever [ n ]; or chills [ n ];          Dental: poor dentition[dentures  ]; Last Dentist visit:   Eye : blurred vision [  ]; diplopia [   ]; vision changes [  ];  Amaurosis fugax[  ]; Resp: cough Blue.Reese  ];  wheezing[ y ];  hemoptysis[ y ]; shortness of breath[y  ]; paroxysmal nocturnal dyspnea[ y ]; dyspnea on exertion[  y]; or orthopnea[ y ];  GI:  gallstones[ n ], vomiting[ n ];  dysphagia[ n ]; melena[n  ];  hematochezia [n  ]; heartburn[ y ];   Hx of  Colonoscopy[  ]; GU: kidney stones [  ]; hematuria[n  ];   dysuria [  ];  nocturia[  ];  history of     obstruction [  ]; urinary frequency [  ]             Skin: rash, swelling[  ];, hair loss[  ];  peripheral edema[  ];  or itching[  ]; Musculosketetal: myalgias[  ];  joint swelling[  ];  joint erythema[  ];  joint pain[ y ];  back pain[y  ];  Heme/Lymph: bruising[ y ];  bleeding[y  ];  anemia[  ];  Neuro: TIA[ n ];  headaches[  ];  stroke[n  ];  vertigo[  ];  seizures[  ];   paresthesias[  ];  difficulty walking[ y ];  Psych:depression[  ]; anxiety[  ];  Endocrine: diabetes[  ];  thyroid dysfunction[  ];  Immunizations: Flu up to date [  ]; Pneumococcal up to date [  ];  Other: Patient and daughter note she has troubles with her memory and is currently on  medication for this  Physical Exam: BP 167/86  Pulse 69  Temp(Src) 97.3 F (36.3 C)  Resp 18  SpO2 96%  PHYSICAL EXAMINATION:   General appearance: alert, cooperative, appears older than stated age, moderately obese and The patient is on continuous oxygen and dyspneic at rest and  just moving short distance Neurologic: intact Heart: regular rate and rhythm, S1, S2 normal, no murmur, click, rub or gallop Lungs: diminished breath sounds bilaterally and wheezes bilaterally Abdomen: soft, non-tender; bowel sounds normal; no masses,  no organomegaly Extremities: Both lower extremities were severely edematous right greater than left with severe venous stasis changes bilaterally and unhealed venous stasis ulceration especially in the right leg Patient has no carotid bruits, no cervical supraclavicular or axillary adenopathy   Diagnostic Studies & Laboratory data:     Recent Radiology Findings:     CLINICAL DATA: Followup pulmonary nodules 03/07/2013 EXAM: CT CHEST WITHOUT CONTRAST TECHNIQUE: Multidetector CT imaging of the chest was performed following the standard protocol without IV contrast. COMPARISON: 10/23/2012 and PET scan 10/29/2012 FINDINGS: Sagittal images of the spine shows osteopenia and mild degenerative changes thoracic spine.  Atherosclerotic calcifications of thoracic aorta and coronary arteries are noted. Low-density nodule right adrenal gland is stable in size in appearance from prior exam.  Previous airspace disease in anterior medial aspect of the right upper lobe has resolved There is decrease in size in nodule in right upper lobe anteriorly measures 8 mm on the prior exam measures 1.3 cm. There is new elongated nodular density in right upper lobe laterally measures about 9 mm. Attention should be given on follow-up examination. A nodule in left lower lobe laterally measures 5 mm stable in size in appearance from prior exam. There is a spiculated nodule in  left upper lobe centrally measures 1.7 by 1.5 cm. On the prior exam measures 1.2 x 1 cm. This is consistent with malignancy as demonstrated hypermetabolic activity on the prior PET scan.  Surgical construct or biopsy is recommended. A calcified granuloma or hamartoma in left lower lobe posteriorly is stable in size in appearance from prior exam. Small hiatal hernia. Stable left adrenal nodule. Precarinal lymph node measures 1 cm stable in size in appearance. No hilar adenopathy.  IMPRESSION: 1. There is improvement in nodule and airspace disease in right upper lobe anterior medially. There is a new somewhat elongated nodule in right upper lobe laterally measures 9 mm. 2. No hilar adenopathy. Stable borderline precarinal lymph node. 3. There is progression in size of a spiculated nodule in left upper lobe centrally measures 1.5 by 1.7 cm. This is consistent with malignancy. Surgical consult or biopsy is recommended. These results were called by telephone at the time of interpretation on 03/07/2013 at 2:40 PM to Dr. Gardiner Rhyme MD, who verbally acknowledged these results. Electronically Signed By: Lahoma Crocker M.D. On: 03/07/2013 14:43   RADIOLOGY REPORT* 10/23/2012 Clinical Data: Lung nodule, chest swelling. CT CHEST WITHOUT CONTRAST Technique: Multidetector CT imaging of the chest was performed following the standard protocol without IV contrast. Comparison: Chest radiograph 10/14/2012 and CT chest 06/01/2005.  Findings: Small nodular densities in the thyroid measure less than 1 cm in size. Sub-centimeter thyroid nodule(s) noted, too small to characterize, but most likely benign in the absence of known clinical risk factors  for thyroid carcinoma.  Mediastinal lymph nodes measure up to 11 mm in the low right paratracheal station. Hilar regions are difficult to definitively evaluate without IV contrast. No axillary adenopathy. Extensive atherosclerotic calcification of the arterial  vasculature, including coronary arteries. Heart is enlarged. No pericardial effusion.  Note is made that respiratory motion degrades image quality. There is a nodular lesion in the anterior segment right upper lobe, measuring approximately 9 x 13 mm, with adjacent collapse/consolidation medially (series 4, images 19-25). Subpleural scarring in the lateral right lower lobe. Possible tiny nodular density along the right hemidiaphragm (image 43).  An irregular nodule is seen in the apical left upper lobe, measuring 10 x 12 mm, new from 06/01/2005. A calcified subpleural lesion in the left lower lobe is unchanged. Scattered subpleural nodular densities are faint (example image 32) and measure less than 4 mm in size. No pleural fluid. Airway is unremarkable.  Incidental imaging of the upper abdomen shows adrenal nodules bilaterally, measuring up to 1.1 x 1.7 cm on the right and 1.7 x 2.1 cm on the left. These are low in attenuation and stable from 06/01/2005. No worrisome lytic or sclerotic lesions.  IMPRESSION:  1. Irregular nodules in the upper lobes bilaterally, worrisome for synchronous primary bronchogenic carcinomas. These results will be called to the ordering clinician or representative by the Radiologist Assistant, and communication documented in the PACS Dashboard. 2. Borderline enlarged low right paratracheal lymph node. 3. Additional scattered nonspecific pulmonary nodular densities are small in size and can be reassessed on follow-up examination. Original Report Authenticated By: Lorin Picket, M.D.    EXAM: NUCLEAR MEDICINE PET SKULL BASE TO THIGH FASTING BLOOD GLUCOSE: Value: 86 mg/dl TECHNIQUE: 13.7 mCi F-18 FDG was injected intravenously. CT data was obtained and used for attenuation correction and anatomic localization only. (This was not acquired as a diagnostic CT examination.) Additional exam technical data entered on technologist worksheet. The PET images  were only acquired through the thorax. Imaging of the abdomen and pelvis could not be completed due to patient claustrophobia. COMPARISON: CT chest 10/23/2012 FINDINGS: NECK No hypermetabolic lymph nodes in the neck. CHEST Within the left upper lobe there, is a hypermetabolic nodule measuring 15 mm (image 36). This nodule has intense metabolic activity with SUV max = 8.9. There is a focus of consolidation within the medial aspect of the right middle lobe measuring 5.7 x 3.8 cm. Within the central portions consolidation there is a focus of intense metabolic activity with this had SUV max 8.1(image 69 of the CT series and 56 fused series).. Within the left lower lobe is calcified nodule (image 131) measuring 14 mm which is not hypermetabolic.  Within the mediastinum there is a mildly enlarged prevascular lymph node measuring 13 mm (image 61) with mild metabolic activity equal or less than blood which is indeterminate. .  ABDOMEN/PELVIS  No PET data is available for the abdomen or pelvis. There is enlargement of the left adrenal gland to 23 mm. This nodule has low Hounsfield units on comparison CT consistent with a benign adenoma.  SKELETON  No evidence of hypermetabolic skeletal metastasis in the thorax. No evidence of sclerotic lesions in the abdomen or pelvis.  IMPRESSION: 1. Hypermetabolic left upper lobe pulmonary nodule is concerning for bronchogenic carcinoma. This may be amenable to percutaneous biopsy. 2. Consolidation within the right middle lobe. Differential includes infection versus neoplasm or a combination of both. This may be amenable to percutaneous biopsy or bronchoscopy. 3. Mild enlarged minimally hypermetabolic pre carinal  lymph node is indeterminate and may well be reactive . 4. Left adrenal adenoma. 5. No metabolic FDG data is provided for the abdomen or pelvis. No gross evidence of metastasis on CT portion. Electronically Signed By: Suzy Bouchard  M.D. On: 10/30/2012 11:03  Recent Lab Findings: Lab Results  Component Value Date   WBC 6.9 03/20/2013   PFTs dated September 2014 FEV1 1.4 63% predicted DLCO 7.5 735% predicted Serial PFT   2009 -2011 show a FEV1 between 1.1 and 0.84 and DLCO 4.82-3.04  Assessment / Plan:     1.) spiculated nodule in left upper lobe centrally measures 1.7 by 1.5 cm enlarged slightly since September but definitely more dense radiographically, highly suspicious for malignancy. The collapse in the right middle lobe has improved on followup scans but there are nodularities in the right chest there too small to characterize. 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 With her poor medical condition and severe COPD oxygen dependent I will not think she will be a resection candidate. I recommended to her we obtain her most recent CT scan data and consider bronchoscopy with ebus and electromagnetic navigation bronchoscopy with biopsy to obtain diagnosis. If tissue diagnosis confirms our clinical suspicion she may be a candidate for stereotactic radio therapy treatment. Patient is agreeable with this  2.) Currently on anticoagulation with xarelto because of severe venous stasis changes and recent right leg DVT, with history of recent DVT and severe venous stasis changes in the lower extremity we'll plan to stop her xarelto with a Lovenox bridge pending surgical biopsy.   3 )Severe COPD on home oxygen and limited exercise tolerance    Patient has bridged with xarelto and lovenox , no Lovenox since yesterday morning .  The goals risks and alternatives of the planned surgical procedure bronchoscopy, ebus,enb with bx  have been discussed with the patient in detail. The risks of the procedure including death, infection, stroke, myocardial  infarction, bleeding, blood transfusion have all been discussed specifically.  I have quoted [name] a 5 % of perioperative mortality and a complication rate as high as 20%. The patient's questions have been answered.Merary Garguilo is willing  to proceed with the planned procedure.  Grace Isaac MD      [address] [place] [address] Office [phone]   Beeper [phone]  03/25/2013 3:47 PM

## 2013-03-25 NOTE — Brief Op Note (Signed)
      [address]       [place] [address]             [phone]      03/25/2013  5:38 PM  PATIENT:  Anne Sanders  68 y.o. female  PRE-OPERATIVE DIAGNOSIS:  LUNG MASS  POST-OPERATIVE DIAGNOSIS:  * lung cancer *  PROCEDURE:  Procedure(s): VIDEO BRONCHOSCOPY WITH ENDOBRONCHIAL NAVIGATION (N/A) VIDEO BRONCHOSCOPY WITH ENDOBRONCHIAL ULTRASOUND (N/A)  SURGEON:  Surgeon(s) and Role:    * Grace Isaac, MD - Primary  PHYSICIAN ASSISTANT:   ASSISTANTS: none   ANESTHESIA:   general  EBL:  Total I/O In: 1000 [I.V.:1000] Out: -   BLOOD ADMINISTERED:none  DRAINS: none   LOCAL MEDICATIONS USED:  NONE  SPECIMEN:  Source of Specimen:  left upper lobe lung lesion  DISPOSITION OF SPECIMEN:  PATHOLOGY  COUNTS:  YES   DICTATION: .Dragon Dictation  PLAN OF CARE: Discharge to home after PACU  PATIENT DISPOSITION:  PACU - hemodynamically stable.   Delay start of Pharmacological VTE agent (>24hrs) due to surgical blood loss or risk of bleeding: to restart xarleto tomorrow

## 2013-03-25 NOTE — Progress Notes (Signed)
Pt uses home O2. Placed back on her personal oxygen tank for discharge home.

## 2013-03-25 NOTE — Anesthesia Procedure Notes (Signed)
Procedure Name: Intubation Date/Time: 03/25/2013 3:55 PM Performed by: Manuela Schwartz B Pre-anesthesia Checklist: Patient identified, Emergency Drugs available, Suction available, Patient being monitored and Timeout performed Patient Re-evaluated:Patient Re-evaluated prior to inductionOxygen Delivery Method: Circle system utilized Preoxygenation: Pre-oxygenation with 100% oxygen Intubation Type: IV induction and Rapid sequence Laryngoscope Size: Mac and 3 Grade View: Grade I Tube type: Oral Tube size: 8.5 mm Number of attempts: 1 Airway Equipment and Method: Stylet Placement Confirmation: ETT inserted through vocal cords under direct vision,  positive ETCO2 and breath sounds checked- equal and bilateral Secured at: 21 cm Tube secured with: Tape Dental Injury: Teeth and Oropharynx as per pre-operative assessment

## 2013-03-25 NOTE — Anesthesia Postprocedure Evaluation (Signed)
Anesthesia Post Note  Patient: Anne Sanders  Procedure(s) Performed: Procedure(s) (LRB): VIDEO BRONCHOSCOPY WITH ENDOBRONCHIAL NAVIGATION (N/A) VIDEO BRONCHOSCOPY WITH ENDOBRONCHIAL ULTRASOUND (N/A)  Anesthesia type: General  Patient location: PACU  Post pain: Pain level controlled and Adequate analgesia  Post assessment: Post-op Vital signs reviewed, Patient's Cardiovascular Status Stable, Respiratory Function Stable, Patent Airway and Pain level controlled  Last Vitals:  Filed Vitals:   03/25/13 1736  BP:   Pulse:   Temp: 37.2 C  Resp:     Post vital signs: Reviewed and stable  Level of consciousness: awake, alert  and oriented  Complications: No apparent anesthesia complications

## 2013-03-25 NOTE — Discharge Instructions (Signed)
Resume Xarelto tomorrow 2/4      Flexible Bronchoscopy, Care After Refer to this sheet in the next few weeks. These instructions provide you with information on caring for yourself after your procedure. Your health care provider may also give you more specific instructions. Your treatment has been planned according to current medical practices, but problems sometimes occur. Call your health care provider if you have any problems or questions after your procedure.  WHAT TO EXPECT AFTER THE PROCEDURE It is normal to have the following symptoms for 24 48 hours after the procedure:   Increased cough.  Low-grade fever.  Sore throat or hoarse voice.  Small streaks of blood in your thick spit (sputum), if tissue samples were taken (biopsy). HOME CARE INSTRUCTIONS   Do not eat or drink anything for 2 hours after your procedure. Your nose and throat were numbed by medicine. If you try to eat or drink before the medicine wears off, food or drink could go into your lungs or you could burn yourself. After the numbness is gone and your cough and gag reflexes have returned, you may eat soft food and drink liquids slowly.   The day after the procedure, you can go back to your normal diet.   You may resume normal activities.   Follow up with your health care provider as directed. It is important to keep all follow-up appointments, especially if tissue samples were taken for testing (biopsy). SEEK IMMEDIATE MEDICAL CARE IF:   You have increasing shortness of breath.   You become lightheaded or faint.   You have chest pain.   You have any new concerning symptoms.  You cough up more than a small amount of blood.  The amount of blood you cough up increases. MAKE SURE YOU:  Understand these instructions.  Will watch your condition.  Will get help right away if you are not doing well or get worse. Document Released: 08/26/2004 Document Revised: 11/27/2012 Document Reviewed:  10/11/2012 [facility] Patient Information 2014 [employer].

## 2013-03-27 ENCOUNTER — Ambulatory Visit: Payer: Medicare Other | Attending: Radiation Oncology | Admitting: Physical Therapy

## 2013-03-27 ENCOUNTER — Encounter: Payer: Self-pay | Admitting: *Deleted

## 2013-03-27 ENCOUNTER — Ambulatory Visit
Admission: RE | Admit: 2013-03-27 | Discharge: 2013-03-27 | Disposition: A | Discharge status: Home / Self Care | Payer: Medicare Other | Source: Ambulatory Visit | Attending: Radiation Oncology | Admitting: Radiation Oncology

## 2013-03-27 DIAGNOSIS — J4489 Other specified chronic obstructive pulmonary disease: Secondary | ICD-10-CM | POA: Insufficient documentation

## 2013-03-27 DIAGNOSIS — R5381 Other malaise: Secondary | ICD-10-CM | POA: Insufficient documentation

## 2013-03-27 DIAGNOSIS — E119 Type 2 diabetes mellitus without complications: Secondary | ICD-10-CM | POA: Insufficient documentation

## 2013-03-27 DIAGNOSIS — I4891 Unspecified atrial fibrillation: Secondary | ICD-10-CM | POA: Insufficient documentation

## 2013-03-27 DIAGNOSIS — C3490 Malignant neoplasm of unspecified part of unspecified bronchus or lung: Secondary | ICD-10-CM | POA: Insufficient documentation

## 2013-03-27 DIAGNOSIS — Z86718 Personal history of other venous thrombosis and embolism: Secondary | ICD-10-CM | POA: Insufficient documentation

## 2013-03-27 DIAGNOSIS — C3410 Malignant neoplasm of upper lobe, unspecified bronchus or lung: Secondary | ICD-10-CM

## 2013-03-27 DIAGNOSIS — IMO0001 Reserved for inherently not codable concepts without codable children: Secondary | ICD-10-CM | POA: Insufficient documentation

## 2013-03-27 DIAGNOSIS — J449 Chronic obstructive pulmonary disease, unspecified: Secondary | ICD-10-CM | POA: Insufficient documentation

## 2013-03-27 NOTE — Op Note (Signed)
NAMERAGINA, FENTER NO.:  [id number]  MEDICAL RECORD NO.:  [id number]  LOCATION:  [facility]                         FACILITY:  [facility]  PHYSICIAN:  Lanelle Bal, MD    DATE OF BIRTH:  1945/04/12  DATE OF PROCEDURE:  03/25/2013 DATE OF DISCHARGE:  03/25/2013                              OPERATIVE REPORT   PREOPERATIVE DIAGNOSIS:  Enlarging left upper lobe lung lesion, suspicious for carcinoma.  POSTOPERATIVE DIAGNOSIS:  Enlarging left upper lobe lung lesion, suspicious for carcinoma.  SURGICAL PROCEDURES:  Bronchoscopy, endobronchial ultrasound, electromagnetic navigation bronchoscopy with transbronchial biopsy under general anesthesia.  SURGEON:  Lanelle Bal, MD  BRIEF HISTORY:  The patient is a 68 year old female with longstanding pulmonary disease, probably attributable both to long-term working in a [employer] and also tobacco use.  She has been oxygen dependent for 9- 10 years and has borderline pulmonary function studies.  Because of the enlarged, she also has a history of recent DVT and has been anticoagulated on Xarelto.  Followup x-rays and CT scan for episode of pneumonia in the summer of 2014 was led to serial CT scans, which is demonstrated an enlarging left upper lobe lung mass in the midportion of the left upper lobe.  The patient had previously undergone a bronchoscopy, but no definitive diagnosis could be made.  She was seen in consultation and because of her poor lung function, and location of the mass, it was recommended to her that we proceed with bronchoscopy, EBUS, and ENB to navigate and obtain a tissue diagnosis.  She was transitioned off Xarelto and on Lovenox for temporary period and then came in for procedure.  Risks and options were discussed with the patient and her family in detail, and she was willing to proceed.  DESCRIPTION OF PROCEDURE:  The patient underwent general endotracheal anesthesia without incident.  A  single-lumen endotracheal tube was placed without difficulty.  Initially through this, a 2-mm fiberoptic bronchoscope was passed to the subsegmental level both left and right bronchial tree.  She had significant amount of pulmonary secretions, which were suctioned.  Cytologies and cultures were obtained.  Initial visual bronchoscope was removed and an EBUS scope was placed.  Previous CT scan showed a very marginally enlarged VII lymph node, but without other significant lymph nodes.  With the EBUS scope, we were really not able to locate any lymph nodes of definitive size to attempt biopsy. Therefore, the scope was removed and we proceeded with ENB.  The scope with the probe tip was positioned and calibrated.  We then followed the previously generated plan to the left upper lobe lesion with good success of localization and on fluoroscopy and the ENB were approximately 1 cm from the target.  Through the working channel, needle, brushes were obtained with 3 separate passes and slides created. 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  Additional satellite and multiple passes of biopsy forceps obtaining tissue were all submitted to pathology, both for definitive diagnosis and to allocate for molecular testing as appropriate.  The patient tolerated the procedure well.  The scopes were removed.  The patient was awakened and extubated in the operating room  without difficulty, and was transferred to the recovery room for further postoperative care.     Lanelle Bal, MD     EG/MEDQ  D:  03/26/2013  T:  03/27/2013  Job:  [id number]  cc:   Dr. Dominica Severin

## 2013-03-28 LAB — CULTURE, RESPIRATORY W GRAM STAIN

## 2013-03-28 LAB — CULTURE, RESPIRATORY

## 2013-03-31 ENCOUNTER — Encounter (HOSPITAL_COMMUNITY): Payer: Self-pay | Admitting: Cardiothoracic Surgery

## 2013-04-01 NOTE — Progress Notes (Signed)
Radiation Oncology         [phone] ________________________________  Name: Anne Sanders MRN: [id number]  Date: 03/27/2013  DOB: Dec 24, 1945  EX:[id number] Not In System  [place], *     REFERRING PHYSICIAN: Melrose Nakayama, *   DIAGNOSIS: The encounter diagnosis was Malignant neoplasm of upper lobe, bronchus or lung.   HISTORY OF PRESENT ILLNESS::Anne Sanders is a 68 y.o. female who is seen for an initial consultation visit. The patient indicates that she has had a couple of bouts of pneumonia. This began in the summer of [phone] and this led to further workup. The patient underwent a chest x-ray in late August but because of some persistence of abnormality within the right lung the patient proceeded to undergo a CT scan. The patient was found to have bilateral upper lobe nodules which were concerning. A repeat CT scan more recently showed some clearing on the right. However, there was enlargement of a left upper lobe nodule now measuring 1.5 x 1.7 cm. This was felt to be consistent with malignancy.  The patient proceeded with video bronchoscopy with endobronchial navigation and ultrasound on 03/25/2013.  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  The patient's case was discussed this morning in multidisciplinary thoracic conference. She has been seen by thoracic surgery and is not felt to be a good surgical candidate. I have therefore been asked to see the patient today for consideration of stereotactic body radiation treatment.  The patient at this time indicates that she has been on supplemental oxygen for approximately 10 years. No recent change in this or change in shortness of breath. The patient denies any chest pain.   PREVIOUS RADIATION THERAPY: No   PAST MEDICAL HISTORY:  has a past medical history of Hypertension; DVT (deep vein thrombosis) in pregnancy; Hyperlipidemia; Sleep apnea with  hypersomnolence; Chronic rhinitis; Lung nodules; Diabetes mellitus without complication; COPD (chronic obstructive pulmonary disease); Shortness of breath; Pneumonia; Anxiety; Pulmonary embolism; CHF (congestive heart failure); GERD (gastroesophageal reflux disease); Umbilical hernia; Anemia; and A-fib.     PAST SURGICAL HISTORY: Past Surgical History  Procedure Laterality Date  . Bronchoscopy  11/11/12    negative pathology of right kung mass...Dr. Alcide Clever  . Transbronschial biopsy  11/11/12    right lung mass...neg path..Dr Alcide Clever  . Vaginal hysterectomy      still has both ovaries and tubes  . Video bronchoscopy with endobronchial navigation N/A 03/25/2013    Procedure: VIDEO BRONCHOSCOPY WITH ENDOBRONCHIAL NAVIGATION;  Surgeon: Grace Isaac, MD;  Location: [facility] OR;  Service: Thoracic;  Laterality: N/A;  . Video bronchoscopy with endobronchial ultrasound N/A 03/25/2013    Procedure: VIDEO BRONCHOSCOPY WITH ENDOBRONCHIAL ULTRASOUND;  Surgeon: Grace Isaac, MD;  Location: [facility];  Service: Thoracic;  Laterality: N/A;     FAMILY HISTORY: family history includes Heart disease in her mother.   SOCIAL HISTORY:  reports that she quit smoking about 12 months ago. Her smoking use included Cigarettes. She started smoking about 52 years ago. She has a 45 pack-year smoking history. She has quit using smokeless tobacco. Her smokeless tobacco use included Snuff. She reports that she does not drink alcohol or use illicit drugs.   ALLERGIES: Review of patient's allergies indicates no known allergies.   MEDICATIONS:  Current Outpatient Prescriptions  Medication Sig Dispense Refill  . budesonide (PULMICORT) 0.5 MG/2ML nebulizer solution Take 0.5 mg by nebulization daily as needed (for wheezing).       Marland Kitchen  buPROPion (WELLBUTRIN SR) 150 MG 12 hr tablet Take 150 mg by mouth 2 (two) times daily.      . digoxin (LANOXIN) 0.125 MG tablet Take 0.125 mg by mouth 2 (two) times daily.       Marland Kitchen  Fe-Succ-C-Thre-B12-Des Stomach (FERROVITE PO) Take 1 tablet by mouth 2 (two) times daily.      . fenofibrate micronized (LOFIBRA) 200 MG capsule Take 200 mg by mouth daily before breakfast.      . gabapentin (NEURONTIN) 100 MG capsule Take 100 mg by mouth 2 (two) times daily.       Marland Kitchen glimepiride (AMARYL) 4 MG tablet Take 4 mg by mouth 2 (two) times daily.      . insulin NPH Human (HUMULIN N,NOVOLIN N) 100 UNIT/ML injection Inject 45 Units into the skin daily.      Marland Kitchen ipratropium-albuterol (DUONEB) 0.5-2.5 (3) MG/3ML SOLN Take 3 mLs by nebulization every 6 (six) hours as needed (for shortness of breath).       Marland Kitchen lisinopril (PRINIVIL,ZESTRIL) 2.5 MG tablet Take 2.5 mg by mouth 2 (two) times daily.      Marland Kitchen LORazepam (ATIVAN) 0.5 MG tablet Take 0.5 mg by mouth 2 (two) times daily as needed for anxiety or sleep.      . Melatonin 300 MCG TABS Take 1 tablet by mouth at bedtime as needed (for sleep).       . metoprolol succinate (TOPROL-XL) 100 MG 24 hr tablet Take 100 mg by mouth daily. Take with or immediately following a meal.      . Multiple Vitamin (MULTIVITAMIN) tablet Take 1 tablet by mouth daily.      Marland Kitchen omeprazole (PRILOSEC) 20 MG capsule Take 20 mg by mouth 2 (two) times daily before a meal.      . potassium chloride SA (K-DUR,KLOR-CON) 20 MEQ tablet Take 20 mEq by mouth daily.      . pravastatin (PRAVACHOL) 80 MG tablet Take 80 mg by mouth at bedtime.       No current facility-administered medications for this encounter.     REVIEW OF SYSTEMS:  A 15 point review of systems is documented in the electronic medical record. This was obtained by the nursing staff. However, I reviewed this with the patient to discuss relevant findings and make appropriate changes.  Pertinent items are noted in HPI.    PHYSICAL EXAM:  vitals were not taken for this visit.  ECOG = 0  0 - Asymptomatic (Fully active, able to carry on all predisease activities without restriction)  1 - Symptomatic but completely  ambulatory (Restricted in physically strenuous activity but ambulatory and able to carry out work of a light or sedentary nature. For example, light housework, office work)  2 - Symptomatic, <50% in bed during the day (Ambulatory and capable of all self care but unable to carry out any work activities. Up and about more than 50% of waking hours)  3 - Symptomatic, >50% in bed, but not bedbound (Capable of only limited self-care, confined to bed or chair 50% or more of waking hours)  4 - Bedbound (Completely disabled. Cannot carry on any self-care. Totally confined to bed or chair)  5 - Death   Eustace Pen MM, Creech RH, Tormey DC, et al. [phone]). "Toxicity and response criteria of the [facility]". [place] Oncol. 5 (6): 649-55  General: Well-developed, in no acute distress; the patient is on supplemental oxygen  HEENT: Normocephalic, atraumatic; oral cavity clear Neck: Supple without  any lymphadenopathy Cardiovascular: Regular rate and rhythm Respiratory: Clear to auscultation bilaterally GI: Soft, nontender, normal bowel sounds Extremities: No edema present Neuro: No focal deficits     LABORATORY DATA:  Lab Results  Component Value Date   WBC 6.9 03/20/2013   HGB 11.6* 03/20/2013   HCT 35.9* 03/20/2013   MCV 88.0 03/20/2013   PLT 130* 03/20/2013   Lab Results  Component Value Date   NA 138 03/20/2013   K 4.7 03/20/2013   CL 96 03/20/2013   CO2 29 03/20/2013   Lab Results  Component Value Date   ALT 19 03/20/2013   AST 22 03/20/2013   ALKPHOS 66 03/20/2013   BILITOT 0.3 03/20/2013      RADIOGRAPHY: Dg Chest 2 View Within Previous 72 Hours.  Films Obtained On Friday Are Acceptable For Monday And Tuesday Cases  03/25/2013   CLINICAL DATA:  Radioisotopic bronchoscopy. Patient with a lung mass.  EXAM: CHEST  2 VIEW  COMPARISON:  CT, 03/07/2013.  Chest radiograph, 11/11/2012.  FINDINGS: Small irregular mass in the left upper lobe is stable from the prior CT. There is  a small dense masslike opacity in the left lower lobe, also stable.  Lungs are hyperexpanded but otherwise clear.  No pleural effusion.  No pneumothorax.  Cardiac silhouette is borderline enlarged. Normal mediastinal and hilar contours.  Bony thorax is demineralized but grossly intact.  IMPRESSION: No acute findings. No pneumothorax or complication following bronchoscopy.  Stable left upper lobe left lower lobe masslike opacities.   Electronically Signed   By: Lajean Manes M.D.   On: 03/25/2013 09:46   Dg Chest Port 1 View  03/25/2013   CLINICAL DATA:  Bronchoscopy.  EXAM: PORTABLE CHEST - 1 VIEW  COMPARISON:  DG CHEST 2 VIEW dated 03/25/2013; CT CHEST W/O CM dated 03/07/2013  FINDINGS: Mediastinum and hilar structures are normal. There is cardiomegaly with mild pulmonary vascular prominence and mild interstitial prominence. Mild component of congestive heart failure may be present. No pleural effusion or pneumothorax. Reference made to chest CT report of 03/07/2013 for discussion of pulmonary nodular lesions present. These are not well identified by chest x-ray.  IMPRESSION: 1. No evidence of pneumothorax post bronchoscopy. 2. Cannot exclude mild congestive heart failure with interstitial edema. 3. Reference is made to chest CT report 03/07/2013 for discussion of pulmonary nodular densities present. These are not well identified by chest x-ray.   Electronically Signed   By: Marcello Moores  Register   On: 03/25/2013 17:59   Dg C-arm Bronchoscopy  03/25/2013   CLINICAL DATA: needed for case   C-ARM BRONCHOSCOPY  Fluoroscopy was utilized by the requesting physician.  No radiographic  interpretation.        IMPRESSION: The patient has a new diagnosis of non-small cell lung cancer involving the left upper lung. This represents a T1 A. N0 M0 tumor. The patient is not a good surgical candidate due to poor pulmonary function. She is on supplemental oxygen at this time. The patient was discussed in conference and she is felt  to be a good candidate for stereotactic body radiation treatment.  I therefore discussed with the patient the rationale for stereotactic body radiation treatment in this setting. The patient is a good candidate for this and I believe that her tumor is in a favorable location. We also discussed the potential side effects and risks of treatment. All of her questions were answered. The patient wishes to proceed with radiation treatment at this time.  PLAN: The patient will be scheduled for a simulation such that we can proceed with treatment planning. I anticipate a 3 fraction course of SBRT.    I spent 60 minutes face to face with the patient and more than 50% of that time was spent in counseling and/or coordination of care.    ________________________________   Jodelle Gross, MD, PhD

## 2013-04-03 ENCOUNTER — Ambulatory Visit
Admission: RE | Admit: 2013-04-03 | Discharge: 2013-04-03 | Disposition: A | Discharge status: Home / Self Care | Payer: Medicare Other | Source: Ambulatory Visit | Attending: Radiation Oncology | Admitting: Radiation Oncology

## 2013-04-03 DIAGNOSIS — Z794 Long term (current) use of insulin: Secondary | ICD-10-CM | POA: Insufficient documentation

## 2013-04-03 DIAGNOSIS — Z79899 Other long term (current) drug therapy: Secondary | ICD-10-CM | POA: Insufficient documentation

## 2013-04-03 DIAGNOSIS — Z510 Encounter for antineoplastic radiation therapy: Secondary | ICD-10-CM | POA: Insufficient documentation

## 2013-04-03 DIAGNOSIS — C3410 Malignant neoplasm of upper lobe, unspecified bronchus or lung: Secondary | ICD-10-CM | POA: Insufficient documentation

## 2013-04-14 ENCOUNTER — Ambulatory Visit
Admission: RE | Admit: 2013-04-14 | Discharge: 2013-04-14 | Disposition: A | Discharge status: Home / Self Care | Payer: Medicare Other | Source: Ambulatory Visit | Attending: Radiation Oncology | Admitting: Radiation Oncology

## 2013-04-14 DIAGNOSIS — C3410 Malignant neoplasm of upper lobe, unspecified bronchus or lung: Secondary | ICD-10-CM

## 2013-04-15 NOTE — Progress Notes (Signed)
  Radiation Oncology         [phone] ________________________________  Name: Anne Sanders MRN: [id number]  Date: 04/14/2013  DOB: [birth year]  Stereotactic Body Radiotherapy Treatment Procedure Note  CURRENT FRACTION:    1  PLANNED FRACTIONS:  3  NARRATIVE:  Anne Sanders was brought to the stereotactic radiation treatment machine and placed supine on the CT couch. The patient was set up for stereotactic body radiotherapy on the body fix pillow.  3D TREATMENT PLANNING AND DOSIMETRY:  The patient's radiation plan was reviewed and approved prior to starting treatment.  It showed 3-dimensional radiation distributions overlaid onto the planning CT.  [facility] for the target structures as well as the organs at risk were reviewed. The documentation of this is filed in the radiation oncology EMR.  SIMULATION VERIFICATION:  The patient underwent CT imaging on the treatment unit.  These were carefully aligned to document that the ablative radiation dose would cover the target volume and maximally spare the nearby organs at risk according to the planned distribution.  SPECIAL TREATMENT PROCEDURE: Anne Sanders received high dose ablative stereotactic body radiotherapy to the planned target volume without unforeseen complications. Treatment was delivered uneventfully. The high doses associated with stereotactic body radiotherapy and the significant potential risks require careful treatment set up and patient monitoring constituting a special treatment procedure   STEREOTACTIC TREATMENT MANAGEMENT:  Following delivery, the patient was evaluated clinically. The patient tolerated treatment without significant acute effects, and was discharged to home in stable condition.    PLAN: Continue treatment as planned.  ________________________________  Sheral Apley. Tammi Klippel, M.D.

## 2013-04-16 ENCOUNTER — Ambulatory Visit
Admission: RE | Admit: 2013-04-16 | Discharge: 2013-04-16 | Disposition: A | Discharge status: Home / Self Care | Payer: Medicare Other | Source: Ambulatory Visit | Attending: Radiation Oncology | Admitting: Radiation Oncology

## 2013-04-16 DIAGNOSIS — C3410 Malignant neoplasm of upper lobe, unspecified bronchus or lung: Secondary | ICD-10-CM

## 2013-04-21 ENCOUNTER — Encounter: Payer: Self-pay | Admitting: Radiation Oncology

## 2013-04-21 ENCOUNTER — Ambulatory Visit
Admission: RE | Admit: 2013-04-21 | Discharge: 2013-04-21 | Disposition: A | Discharge status: Home / Self Care | Payer: Medicare Other | Source: Ambulatory Visit | Attending: Radiation Oncology | Admitting: Radiation Oncology

## 2013-04-21 DIAGNOSIS — C3410 Malignant neoplasm of upper lobe, unspecified bronchus or lung: Secondary | ICD-10-CM

## 2013-04-21 NOTE — Progress Notes (Signed)
eot  SBRT  LUL lung rad tx 3/3, 94% on 1.5 lite,n/c, sob with mild exertion, fatigued, eating well, no nausea, 1 month f/u appt card given\ cough up whitish phelgm occasuonally .Marland Kitchennow

## 2013-04-22 NOTE — Progress Notes (Signed)
Department of Radiation Oncology  Phone:  [phone] Fax:        [phone]  Weekly Treatment Note    Name: Anne Sanders Date: 04/22/2013 MRN: [id number] DOB: March 15, 1945   Current dose: 54 Gy  Current fraction: 3   MEDICATIONS: Current Outpatient Prescriptions  Medication Sig Dispense Refill  . budesonide (PULMICORT) 0.5 MG/2ML nebulizer solution Take 0.5 mg by nebulization daily as needed (for wheezing).       Marland Kitchen buPROPion (WELLBUTRIN SR) 150 MG 12 hr tablet Take 150 mg by mouth 2 (two) times daily.      . digoxin (LANOXIN) 0.125 MG tablet Take 0.125 mg by mouth 2 (two) times daily.       Marland Kitchen Fe-Succ-C-Thre-B12-Des Stomach (FERROVITE PO) Take 1 tablet by mouth 2 (two) times daily.      . fenofibrate micronized (LOFIBRA) 200 MG capsule Take 200 mg by mouth daily before breakfast.      . gabapentin (NEURONTIN) 100 MG capsule Take 100 mg by mouth 2 (two) times daily.       Marland Kitchen glimepiride (AMARYL) 4 MG tablet Take 4 mg by mouth 2 (two) times daily.      . insulin NPH Human (HUMULIN N,NOVOLIN N) 100 UNIT/ML injection Inject 45 Units into the skin daily.      Marland Kitchen ipratropium-albuterol (DUONEB) 0.5-2.5 (3) MG/3ML SOLN Take 3 mLs by nebulization every 6 (six) hours as needed (for shortness of breath).       Marland Kitchen lisinopril (PRINIVIL,ZESTRIL) 2.5 MG tablet Take 2.5 mg by mouth 2 (two) times daily.      Marland Kitchen LORazepam (ATIVAN) 0.5 MG tablet Take 0.5 mg by mouth 2 (two) times daily as needed for anxiety or sleep.      . Melatonin 300 MCG TABS Take 1 tablet by mouth at bedtime as needed (for sleep).       . metoprolol succinate (TOPROL-XL) 100 MG 24 hr tablet Take 100 mg by mouth daily. Take with or immediately following a meal.      . Multiple Vitamin (MULTIVITAMIN) tablet Take 1 tablet by mouth daily.      Marland Kitchen omeprazole (PRILOSEC) 20 MG capsule Take 20 mg by mouth 2 (two) times daily before a meal.      . potassium chloride SA (K-DUR,KLOR-CON) 20 MEQ tablet Take 20 mEq by mouth daily.      .  pravastatin (PRAVACHOL) 80 MG tablet Take 80 mg by mouth at bedtime.       No current facility-administered medications for this encounter.     ALLERGIES: Review of patient's allergies indicates no known allergies.   LABORATORY DATA:  Lab Results  Component Value Date   WBC 6.9 03/20/2013   HGB 11.6* 03/20/2013   HCT 35.9* 03/20/2013   MCV 88.0 03/20/2013   PLT 130* 03/20/2013   Lab Results  Component Value Date   NA 138 03/20/2013   K 4.7 03/20/2013   CL 96 03/20/2013   CO2 29 03/20/2013   Lab Results  Component Value Date   ALT 19 03/20/2013   AST 22 03/20/2013   ALKPHOS 66 03/20/2013   BILITOT 0.3 03/20/2013     NARRATIVE: Anne Sanders was seen today for weekly treatment management. The chart was checked and the patient's films were reviewed. The patient finished her course of stereotactic body radiation treatment today. She has done well with this course of treatment. No change in shortness of breath. No chest discomfort.  PHYSICAL EXAMINATION:   Anne Sanders, in no acute  distress, on supplemental oxygen sitting in a wheelchair.  ASSESSMENT: The patient did satisfactorily with treatment.  PLAN: Followup in one month.

## 2013-04-22 NOTE — Progress Notes (Signed)
  Radiation Oncology         [phone] ________________________________  Name: Anne Sanders MRN: [id number]  Date: 04/21/2013  DOB: 12-04-1945   Simulation verification note  The patient underwent film verification for the patient's set-up in preparation for stereotactic body radiosurgery. The patient was placed on the treatment unit and a CT scan was performed. These images were then fused with the patient's planning CT scan. The fusion was carefully reviewed in terms of the patient's anatomy as it related to the planning CT scan. The target structures as well as the organs at risk were evaluated on the patient's treatment CT scan. The target and the normal structures were appropriately aligned for treatment. Therefore the patient proceeded with the fraction of stereotactic body radiosurgery.  Fraction: 3  Dose:  54 Gy   ________________________________  Jodelle Gross, MD, PhD

## 2013-04-22 NOTE — Addendum Note (Signed)
Encounter addended by: Marye Round, MD on: 04/22/2013  9:37 AM<BR>     Documentation filed: Visit Diagnoses, Notes Section

## 2013-04-28 ENCOUNTER — Encounter (HOSPITAL_COMMUNITY): Payer: Self-pay | Admitting: Cardiothoracic Surgery

## 2013-05-13 NOTE — Progress Notes (Signed)
  Radiation Oncology         [phone] ________________________________  Name: Anne Sanders MRN: [id number]  Date: 04/16/2013  DOB: [birth year]   Simulation verification note  The patient underwent film verification for the patient's set-up in preparation for stereotactic body radiosurgery. The patient was placed on the treatment unit and a CT scan was performed. These images were then fused with the patient's planning CT scan. The fusion was carefully reviewed in terms of the patient's anatomy as it related to the planning CT scan. The target structures as well as the organs at risk were evaluated on the patient's treatment CT scan. The target and the normal structures were appropriately aligned for treatment. Therefore the patient proceeded with the fraction of stereotactic body radiosurgery.  Fraction: 2  Dose:  36 Gy   ________________________________  Jodelle Gross, MD, PhD

## 2013-05-13 NOTE — Addendum Note (Signed)
Encounter addended by: Marye Round, MD on: 05/13/2013  7:57 AM<BR>     Documentation filed: Visit Diagnoses, Notes Section

## 2013-05-13 NOTE — Addendum Note (Signed)
Encounter addended by: Marye Round, MD on: 05/13/2013  7:56 AM<BR>     Documentation filed: Notes Section, Visit Diagnoses

## 2013-05-13 NOTE — Progress Notes (Signed)
  Radiation Oncology         [phone] ________________________________  Name: Anne Sanders MRN: [id number]  Date: 04/21/2013  DOB: [birth year]  End of Treatment Note  Diagnosis:   Lung cancer     Indication for treatment:  Curative       Radiation treatment dates:   04/14/2013 through 04/21/2013  Site/dose:   The patient was treated to the lesion within the left upper lobe. She received a course of stereotactic body radiation treatment using to customized fields. This delivered 54 gray in 3 fractions.  Narrative: The patient tolerated radiation treatment relatively well.   The patient did not exhibit any esophagitis or worsening shortness of breath during the course of treatment.  Plan: The patient has completed radiation treatment. The patient will return to radiation oncology clinic for routine followup in one month. I advised the patient to call or return sooner if they have any questions or concerns related to their recovery or treatment. ________________________________  Jodelle Gross, M.D., Ph.D.

## 2013-05-13 NOTE — Progress Notes (Addendum)
[facility] Simulation and Treatment Planning Note   Name:  Anne Sanders MRN: [id number]   Date: 04/03/2013  DOB: Jul 23, 1945  Status:outpatient    DIAGNOSIS: The encounter diagnosis was Malignant neoplasm of upper lobe, bronchus or lung.  SITE:  Left upper lobe   CONSENT VERIFIED:yes   SET UP: Patient is setup supine   IMMOBILIZATION: The patient was immobilized using a Vac Loc bag, and a customized active form device was also constructed to aid in patient immobilization. The patient set up also involved abdominal compression to attempt to reduce respiratory motion. A total of 2 complex treatment devices therefore will be used for immobilization during the course of radiation.    NARRATIVE:The patient was brought to the [facility].  Identity was confirmed.  All relevant records and images related to the planned course of therapy were reviewed.  Then, the patient was positioned in a stable reproducible clinical set-up for radiation therapy.  4D CT images were obtained and reproducible breathing pattern was confirmed. Free breathing CT images were obtained.  Skin markings were placed.  The CT images were loaded into the planning software where the target and avoidance structures were contoured.  The radiation prescription was entered and confirmed.    TREATMENT PLANNING NOTE:  Treatment planning then occurred. I have requested : MLC's, 3D simulation/ isodose plan, basic dose calculation. It is anticipated that 2 customized fields will be used for the patient's treatment, with each of these corresponding to an additional complex treatment device.  3 dimensional simulation is performed and dose volume histogram of the gross tumor volume, planning tumor volume and criticial normal structures including the spinal cord and lungs were analyzed and requested.  Special treatment procedure was performed due to high dose per fraction and the  complexity of the planning process.  The patient will be monitored for increased risk of toxicity.  Daily imaging using cone beam CT/ MV CT will be used for target localization.   PLAN:  The patient will receive 54 Gy in 3 fractions.    ________________________________   Jodelle Gross, MD, PhD

## 2013-05-28 ENCOUNTER — Encounter: Payer: Self-pay | Admitting: Radiation Oncology

## 2013-05-29 ENCOUNTER — Ambulatory Visit
Admission: RE | Admit: 2013-05-29 | Discharge: 2013-05-29 | Disposition: A | Discharge status: Home / Self Care | Payer: Medicare Other | Source: Ambulatory Visit | Attending: Radiation Oncology | Admitting: Radiation Oncology

## 2013-05-29 VITALS — BP 160/75 | HR 72 | Temp 97.5°F | Resp 20 | Wt 204.0 lb

## 2013-05-29 DIAGNOSIS — C3410 Malignant neoplasm of upper lobe, unspecified bronchus or lung: Secondary | ICD-10-CM

## 2013-05-29 HISTORY — DX: Personal history of irradiation: Z92.3

## 2013-05-29 NOTE — Progress Notes (Signed)
Pt denies pain, loss of appetite. She is fatigued at times, SOB w/minimal activity. She wears continuous O2 @ 1-1.5 L/min. She has occasional productive cough w/clear-white sputum. Pt seeing her PCP tomorrow for BP check up.

## 2013-05-29 NOTE — Progress Notes (Signed)
Radiation Oncology         [phone]) [phone] ________________________________  Name: Anne Sanders MRN: [id number]  Date: 05/29/2013  DOB: 31-Jan-1946  Follow-Up Visit Note  CC: PROVIDER NOT IN SYSTEM  Melrose Nakayama, *  Diagnosis:   Non-small cell lung cancer status post stereotactic body radiation treatment  Interval Since Last Radiation:  One month   Narrative:  The patient returns today for routine follow-up.  The patient states that she has done fairly well since she finished treatment. She has some ongoing shortness of breath, no significant change in this. She has an occasional productive cough. No chest pain. No esophagitis.                              ALLERGIES:  has No Known Allergies.  Meds: Current Outpatient Prescriptions  Medication Sig Dispense Refill  . budesonide (PULMICORT) 0.5 MG/2ML nebulizer solution Take 0.5 mg by nebulization daily as needed (for wheezing).       Marland Kitchen buPROPion (WELLBUTRIN SR) 150 MG 12 hr tablet Take 150 mg by mouth 2 (two) times daily.      . digoxin (LANOXIN) 0.125 MG tablet Take 0.125 mg by mouth 2 (two) times daily.       Marland Kitchen Fe-Succ-C-Thre-B12-Des Stomach (FERROVITE PO) Take 1 tablet by mouth 2 (two) times daily.      . fenofibrate micronized (LOFIBRA) 200 MG capsule Take 200 mg by mouth daily before breakfast.      . gabapentin (NEURONTIN) 100 MG capsule Take 100 mg by mouth 2 (two) times daily.       Marland Kitchen glimepiride (AMARYL) 4 MG tablet Take 4 mg by mouth 2 (two) times daily.      . insulin NPH Human (HUMULIN N,NOVOLIN N) 100 UNIT/ML injection Inject 45 Units into the skin daily.      Marland Kitchen ipratropium-albuterol (DUONEB) 0.5-2.5 (3) MG/3ML SOLN Take 3 mLs by nebulization every 6 (six) hours as needed (for shortness of breath).       Marland Kitchen lisinopril (PRINIVIL,ZESTRIL) 2.5 MG tablet Take 2.5 mg by mouth 2 (two) times daily.      Marland Kitchen LORazepam (ATIVAN) 0.5 MG tablet Take 0.5 mg by mouth 2 (two) times daily as needed for anxiety or sleep.      . Melatonin  300 MCG TABS Take 1 tablet by mouth at bedtime as needed (for sleep).       . metoprolol succinate (TOPROL-XL) 100 MG 24 hr tablet Take 100 mg by mouth daily. Take with or immediately following a meal.      . Multiple Vitamin (MULTIVITAMIN) tablet Take 1 tablet by mouth daily.      Marland Kitchen omeprazole (PRILOSEC) 20 MG capsule Take 20 mg by mouth 2 (two) times daily before a meal.      . potassium chloride SA (K-DUR,KLOR-CON) 20 MEQ tablet Take 20 mEq by mouth daily.      . pravastatin (PRAVACHOL) 80 MG tablet Take 80 mg by mouth at bedtime.      Alveda Reasons 20 MG TABS tablet        No current facility-administered medications for this encounter.    Physical Findings: The patient is in no acute distress. Patient is alert and oriented.  weight is 204 lb (92.534 kg). Her oral temperature is 97.5 F (36.4 C). Her blood pressure is 160/75 and her pulse is 72. Her respiration is 20 and oxygen saturation is 93%. .   Clear  auscultation bilaterally  Lab Findings: Lab Results  Component Value Date   WBC 6.9 03/20/2013   HGB 11.6* 03/20/2013   HCT 35.9* 03/20/2013   MCV 88.0 03/20/2013   PLT 130* 03/20/2013     Radiographic Findings: No results found.  Impression/plan:    The patient is doing well 1 month after her course of stereotactic body radiation treatment. We will order a CT scan of the chest in 2 months and then have her return to clinic to discuss this result subsequently.    Jodelle Gross, M.D., Ph.D.

## 2013-05-30 ENCOUNTER — Telehealth: Payer: Self-pay | Admitting: *Deleted

## 2013-05-30 NOTE — Telephone Encounter (Signed)
Called patient's daughter - [name] to give her the appts. For her mom, lvm for a return call

## 2013-06-17 ENCOUNTER — Telehealth: Payer: Self-pay | Admitting: *Deleted

## 2013-06-17 NOTE — Telephone Encounter (Signed)
Called patient's daughter - Anne Sanders to inform that test has been moved to 07-30-13, per patient request due to going to graduations, lvm for a return call

## 2013-07-11 ENCOUNTER — Telehealth: Payer: Self-pay | Admitting: *Deleted

## 2013-07-11 NOTE — Telephone Encounter (Signed)
On 07-11-13 fax medical records to [facility] it was follow up note, end of tx note, consult note,

## 2013-07-29 ENCOUNTER — Ambulatory Visit (HOSPITAL_COMMUNITY): Payer: Medicare Other

## 2013-07-29 ENCOUNTER — Ambulatory Visit: Payer: Medicare Other

## 2013-07-30 ENCOUNTER — Encounter (HOSPITAL_COMMUNITY): Payer: Self-pay

## 2013-07-30 ENCOUNTER — Ambulatory Visit (HOSPITAL_COMMUNITY)
Admission: RE | Admit: 2013-07-30 | Discharge: 2013-07-30 | Disposition: A | Discharge status: Home / Self Care | Payer: Medicare Other | Source: Ambulatory Visit | Attending: Radiation Oncology | Admitting: Radiation Oncology

## 2013-07-30 ENCOUNTER — Ambulatory Visit
Admission: RE | Admit: 2013-07-30 | Discharge: 2013-07-30 | Disposition: A | Discharge status: Home / Self Care | Payer: Medicare Other | Source: Ambulatory Visit | Attending: Radiation Oncology | Admitting: Radiation Oncology

## 2013-07-30 DIAGNOSIS — C3410 Malignant neoplasm of upper lobe, unspecified bronchus or lung: Secondary | ICD-10-CM | POA: Insufficient documentation

## 2013-07-30 LAB — BUN AND CREATININE (CC13)
BUN: 23.5 mg/dL (ref 7.0–26.0)
Creatinine: 1.6 mg/dL — ABNORMAL HIGH (ref 0.6–1.1)

## 2013-07-30 MED ORDER — IOHEXOL 300 MG/ML  SOLN
80.0000 mL | Freq: Once | INTRAMUSCULAR | Status: AC | PRN
  Administered 2013-07-30: 80 mL via INTRAVENOUS

## 2013-07-31 ENCOUNTER — Ambulatory Visit
Admission: RE | Admit: 2013-07-31 | Discharge: 2013-07-31 | Disposition: A | Discharge status: Home / Self Care | Payer: Medicare Other | Source: Ambulatory Visit | Attending: Radiation Oncology | Admitting: Radiation Oncology

## 2013-07-31 VITALS — BP 142/78 | HR 67 | Temp 97.9°F | Resp 22 | Ht 63.0 in | Wt 218.8 lb

## 2013-07-31 DIAGNOSIS — C3410 Malignant neoplasm of upper lobe, unspecified bronchus or lung: Secondary | ICD-10-CM

## 2013-07-31 NOTE — Progress Notes (Signed)
Radiation Oncology         [phone]) [phone] ________________________________  Name: Anne Sanders MRN: [id number]  Date: 07/31/2013  DOB: 1945/05/30  Follow-Up Visit Note  CC: Nikea Settle, [name], FNP  Melrose Nakayama, *  Diagnosis:   Non-small cell lung cancer status post stereotactic body radiation treatment  Interval Since Last Radiation:  3-1/2 months   Narrative:  The patient returns today for routine follow-up.  The patient states that she has had some shortness of breath associated with the increase in temperature. She states this happens during hot weather. Otherwise no new complaints. No complaints of esophagitis and no chest pain. The patient had a recent CT scan of the chest and returns to clinic today for review of this.                              ALLERGIES:  has No Known Allergies.  Meds: Current Outpatient Prescriptions  Medication Sig Dispense Refill  . budesonide (PULMICORT) 0.5 MG/2ML nebulizer solution Take 0.5 mg by nebulization daily as needed (for wheezing).       Marland Kitchen buPROPion (WELLBUTRIN SR) 150 MG 12 hr tablet Take 150 mg by mouth 2 (two) times daily.      . digoxin (LANOXIN) 0.125 MG tablet Take 0.125 mg by mouth 2 (two) times daily.       Marland Kitchen Fe-Succ-C-Thre-B12-Des Stomach (FERROVITE PO) Take 1 tablet by mouth 2 (two) times daily.      . fenofibrate micronized (LOFIBRA) 200 MG capsule Take 200 mg by mouth daily before breakfast.      . gabapentin (NEURONTIN) 100 MG capsule Take 100 mg by mouth 2 (two) times daily.       Marland Kitchen glimepiride (AMARYL) 4 MG tablet Take 4 mg by mouth 2 (two) times daily.      . insulin NPH Human (HUMULIN N,NOVOLIN N) 100 UNIT/ML injection Inject 45 Units into the skin daily.      Marland Kitchen ipratropium-albuterol (DUONEB) 0.5-2.5 (3) MG/3ML SOLN Take 3 mLs by nebulization every 6 (six) hours as needed (for shortness of breath).       Marland Kitchen lisinopril (PRINIVIL,ZESTRIL) 2.5 MG tablet Take 2.5 mg by mouth 2 (two) times daily.      . Melatonin 300 MCG TABS  Take 1 tablet by mouth at bedtime as needed (for sleep).       . metoprolol succinate (TOPROL-XL) 100 MG 24 hr tablet Take 100 mg by mouth daily. Take with or immediately following a meal.      . Multiple Vitamin (MULTIVITAMIN) tablet Take 1 tablet by mouth daily.      Marland Kitchen omeprazole (PRILOSEC) 20 MG capsule Take 20 mg by mouth 2 (two) times daily before a meal.      . potassium chloride SA (K-DUR,KLOR-CON) 20 MEQ tablet Take 20 mEq by mouth daily.      . pravastatin (PRAVACHOL) 80 MG tablet Take 80 mg by mouth at bedtime.      Alveda Reasons 20 MG TABS tablet Take 20 mg by mouth daily.        No current facility-administered medications for this encounter.    Physical Findings: The patient is in no acute distress. Patient is alert and oriented.  height is 5\' 3"  (1.6 m) and weight is 218 lb 12.8 oz (99.247 kg). Her oral temperature is 97.9 F (36.6 C). Her blood pressure is 142/78 and her pulse is 67. Her respiration is 22 and  oxygen saturation is 94%. .   General: Well-developed, in no acute distress, sitting in a wheelchair, on supplemental oxygen HEENT: Normocephalic, atraumatic Cardiovascular: Regular rate and rhythm Respiratory: Clear to auscultation bilaterally GI: Soft, nontender, normal bowel sounds Extremities: No edema present   Lab Findings: Lab Results  Component Value Date   WBC 6.9 03/20/2013   HGB 11.6* 03/20/2013   HCT 35.9* 03/20/2013   MCV 88.0 03/20/2013   PLT 130* 03/20/2013     Radiographic Findings: Ct Chest W Contrast  07/30/2013   CLINICAL DATA:  History of lung cancer diagnosed in February 2015. Radiation therapy complete. Shortness of breath.  EXAM: CT CHEST WITH CONTRAST  TECHNIQUE: Multidetector CT imaging of the chest was performed during intravenous contrast administration.  CONTRAST:  12mL OMNIPAQUE IOHEXOL 300 MG/ML  SOLN  COMPARISON:  Chest CT 03/07/2013.  PET-CT 10/29/2012.  FINDINGS: Mediastinum: Interval development of a 1.4 cm short axis low right  paratracheal lymph node (image 20 of series 2). There are also borderline enlarged 10 mm short axis subcarinal lymph nodes. Heart size is enlarged with left atrial and right atrial dilatation. There is atherosclerosis of the thoracic aorta, the great vessels of the mediastinum and the coronary arteries, including calcified atherosclerotic plaque in the left main, left anterior descending, left circumflex and right coronary arteries. Esophagus is unremarkable in appearance.  Lungs/Pleura: Previously noted left upper lobe nodule appears less bulky than the prior study, currently measuring approximately 1.6 x 1.2 cm (image 12 of series 2), now surrounded by ground-glass attenuation and septal thickening which is most compatible with evolving postradiation changes. 6 mm nodule in the posterior aspect of the left upper lobe abutting the major fissure (image 29 of series 5) is similar to the prior study. Large calcified granuloma in the left lower lobe posteriorly just above the left hemidiaphragm. No pleural effusions. Areas of mild scarring are noted in the anterior aspect of the right upper lobe and the periphery of the right lower lobe. Small focus of peripheral airspace consolidation in the lateral right upper lobe noted on the prior study has resolved on today's examination, presumably infectious or inflammatory on the prior.  Upper Abdomen: Diffuse decreased attenuation throughout the hepatic parenchyma, compatible with hepatic steatosis. Small calcified gallstones in the neck of the gallbladder. 1.9 cm indeterminate nodule in the lateral limb of the left adrenal gland is unchanged compared to prior examinations, but incompletely characterized. Mild diffuse nodular contour in the other portions of the adrenal glands bilaterally, similar to priors.  Musculoskeletal: There are no aggressive appearing lytic or blastic lesions noted in the visualized portions of the skeleton.  IMPRESSION: 1. Positive response to  therapy with slight decreased size of left upper lobe pulmonary nodule, now surrounded by postradiation treatment changes in the left upper lobe. 2. However, there has been an interval increase in size of a low right paratracheal lymph node and subcarinal lymph node, which could indicate nodal metastases. Continued attention on followup studies is recommended to ensure the stability or resolution of these findings. 3. Hepatic steatosis. 4. Unchanged 1.9 cm indeterminate nodule in the lateral limb of the left adrenal gland. Although incompletely characterized, the stability in size and appearance of this lesion over time suggests a benign entity such is a lipid poor adenoma.   Electronically Signed   By: Vinnie Langton M.D.   On: 07/30/2013 14:25    Impression:    The patient clinically has been stable. The patient's CT scan showed a positive  response to therapy. We will need to continue to follow some lymph nodes which were commented on. No clear indication of regional or distant disease.  Plan:  CT scan of the chest in 3 months, then followup appointment.   Jodelle Gross, M.D., Ph.D.

## 2013-07-31 NOTE — Progress Notes (Signed)
Follow up s/p [facility] Lung 04/14/13-04/21/13, patient on 1.5 liters oxygen and rr=22, c/o sob with this hot weather", no pain, legs swollen, right leg wrapped ace bandage, left leg warm red to touch, swollen, , CT done chest 07/30/13 results here Regular pulse,took manual b/p, fatigued, weak, appetite good, drinking plenty fluids, takes lasix and potassium daily, hasn't taken last 2 days, no nausea, bowel movements regular 3:29 PM

## 2013-08-01 ENCOUNTER — Telehealth: Payer: Self-pay | Admitting: *Deleted

## 2013-08-01 NOTE — Telephone Encounter (Signed)
CALLED PATIENT TO INFORM OF LABS, TEST AND FU VISIT, SPOKE WITH PATIENT AND SHE IS AWARE OF THESE APPTS.

## 2013-10-31 ENCOUNTER — Encounter (HOSPITAL_COMMUNITY): Payer: Self-pay

## 2013-10-31 ENCOUNTER — Ambulatory Visit
Admission: RE | Admit: 2013-10-31 | Discharge: 2013-10-31 | Disposition: A | Discharge status: Home / Self Care | Payer: Medicare Other | Source: Ambulatory Visit | Attending: Radiation Oncology | Admitting: Radiation Oncology

## 2013-10-31 ENCOUNTER — Ambulatory Visit (HOSPITAL_COMMUNITY)
Admission: RE | Admit: 2013-10-31 | Discharge: 2013-10-31 | Disposition: A | Discharge status: Home / Self Care | Payer: Medicare Other | Source: Ambulatory Visit | Attending: Radiation Oncology | Admitting: Radiation Oncology

## 2013-10-31 DIAGNOSIS — Z923 Personal history of irradiation: Secondary | ICD-10-CM | POA: Insufficient documentation

## 2013-10-31 DIAGNOSIS — C3410 Malignant neoplasm of upper lobe, unspecified bronchus or lung: Secondary | ICD-10-CM | POA: Diagnosis present

## 2013-10-31 LAB — BUN AND CREATININE (CC13)
BUN: 38.2 mg/dL — ABNORMAL HIGH (ref 7.0–26.0)
CREATININE: 1.8 mg/dL — AB (ref 0.6–1.1)

## 2013-10-31 MED ORDER — IOHEXOL 300 MG/ML  SOLN
80.0000 mL | Freq: Once | INTRAMUSCULAR | Status: AC | PRN
  Administered 2013-10-31: 80 mL via INTRAVENOUS

## 2013-11-06 ENCOUNTER — Encounter: Payer: Self-pay | Admitting: Radiation Oncology

## 2013-11-06 ENCOUNTER — Ambulatory Visit
Admission: RE | Admit: 2013-11-06 | Discharge: 2013-11-06 | Disposition: A | Discharge status: Home / Self Care | Payer: Medicare Other | Source: Ambulatory Visit | Attending: Radiation Oncology | Admitting: Radiation Oncology

## 2013-11-06 VITALS — BP 162/79 | HR 70 | Temp 97.6°F | Resp 22 | Ht 63.0 in | Wt 213.8 lb

## 2013-11-06 DIAGNOSIS — C3410 Malignant neoplasm of upper lobe, unspecified bronchus or lung: Secondary | ICD-10-CM

## 2013-11-06 NOTE — Progress Notes (Signed)
Radiation Oncology         [phone]) [phone] ________________________________  Name: Anne Sanders MRN: [id number]  Date: 11/06/2013  DOB: 1945-07-29  Follow-Up Visit Note  CC: Adali Pennings, [name], FNP  Melrose Nakayama, *  Diagnosis:   Non-small cell lung cancer  Interval Since Last Radiation:  The patient completed radiation treatment on 04/21/2013   Narrative:  The patient returns today for routine follow-up.  The patient states she has been stable clinically. She remains on supplemental oxygen. No significant change in shortness of breath. No cough. No fever. No pain in the chest area.                              ALLERGIES:  has No Known Allergies.  Meds: Current Outpatient Prescriptions  Medication Sig Dispense Refill  . budesonide (PULMICORT) 0.5 MG/2ML nebulizer solution Take 0.5 mg by nebulization daily as needed (for wheezing).       Marland Kitchen digoxin (LANOXIN) 0.125 MG tablet Take 0.125 mg by mouth 2 (two) times daily.       Marland Kitchen Fe-Succ-C-Thre-B12-Des Stomach (FERROVITE PO) Take 1 tablet by mouth 2 (two) times daily.      . fenofibrate micronized (LOFIBRA) 200 MG capsule Take 200 mg by mouth daily before breakfast.      . gabapentin (NEURONTIN) 100 MG capsule Take 100 mg by mouth 2 (two) times daily. Antibiotic started unsure name, big white pill for her infectionin her legs, sees MD Friday 11/07/13      . glimepiride (AMARYL) 4 MG tablet Take 4 mg by mouth 2 (two) times daily.      . insulin NPH Human (HUMULIN N,NOVOLIN N) 100 UNIT/ML injection Inject 45 Units into the skin daily.      Marland Kitchen ipratropium-albuterol (DUONEB) 0.5-2.5 (3) MG/3ML SOLN Take 3 mLs by nebulization every 6 (six) hours as needed (for shortness of breath).       Marland Kitchen lisinopril (PRINIVIL,ZESTRIL) 2.5 MG tablet Take 2.5 mg by mouth 2 (two) times daily.      . Melatonin 300 MCG TABS Take 1 tablet by mouth at bedtime as needed (for sleep).       . metoprolol succinate (TOPROL-XL) 100 MG 24 hr tablet Take 100 mg by mouth daily.  Take with or immediately following a meal.      . Multiple Vitamin (MULTIVITAMIN) tablet Take 1 tablet by mouth daily.      Marland Kitchen omeprazole (PRILOSEC) 20 MG capsule Take 20 mg by mouth 2 (two) times daily before a meal.      . potassium chloride SA (K-DUR,KLOR-CON) 20 MEQ tablet Take 20 mEq by mouth daily.      . pravastatin (PRAVACHOL) 80 MG tablet Take 80 mg by mouth at bedtime.      Marland Kitchen PRESCRIPTION MEDICATION Take 1 tablet by mouth 2 (two) times daily.      Alveda Reasons 20 MG TABS tablet Take 20 mg by mouth daily.       Marland Kitchen buPROPion (WELLBUTRIN SR) 150 MG 12 hr tablet Take 150 mg by mouth 2 (two) times daily.       No current facility-administered medications for this encounter.    Physical Findings: The patient is in no acute distress. Patient is alert and oriented.  height is 5\' 3"  (1.6 m) and weight is 213 lb 12.8 oz (96.979 kg). Her oral temperature is 97.6 F (36.4 C). Her blood pressure is 162/79 and her pulse is  70. Her respiration is 22 and oxygen saturation is 95%. .   Clear to auscultation bilaterally  Lab Findings: Lab Results  Component Value Date   WBC 6.9 03/20/2013   HGB 11.6* 03/20/2013   HCT 35.9* 03/20/2013   MCV 88.0 03/20/2013   PLT 130* 03/20/2013     Radiographic Findings: Ct Chest W Contrast  10/31/2013   CLINICAL DATA:  Lung cancer status post radiation treatment  EXAM: CT CHEST WITH CONTRAST  TECHNIQUE: Multidetector CT imaging of the chest was performed during intravenous contrast administration.  CONTRAST:  44mL OMNIPAQUE IOHEXOL 300 MG/ML  SOLN  COMPARISON:  None.  FINDINGS: Left upper lobe nodule continues to decrease in size measuring 14 x 11 mm compared 18 x 12 mm on most recent CT scan (image number 10, series 7). There are no new pulmonary nodules. Subpleural nodule along the left oblique fissure measures 5 mm (image 27) not changed from prior. There is calcified granuloma at the left lung base. Right lung is clear. There is resolution of the previous seen right  middle lobe opacity now only bronchiectasis remaining.  In the mediastinum 10 mm right lower paratracheal lymph node compares to 13 mm on prior. Subcarinal lymph node measures 7 mm compares 10 mm on prior.  Limited view of the upper abdomen demonstrates enlargement of the left adrenal gland No focal hepatic lesion. No aggressive osseous lesion.  IMPRESSION: 1. Continued decrease in size of left upper lobe pulmonary nodule at treatment site. 2. Decrease in size of mediastinal lymph nodes. 3. Mild bronchiectasis in right middle lobe at site of previous consolidation. 4. Stable left adrenal nodule.   Electronically Signed   By: Suzy Bouchard M.D.   On: 10/31/2013 15:28    Impression:    The patient is clinically stable. I have personally reviewed her recent CT scan of the chest which did not show any evidence for recurrence/progression. Overall this looks good. We will continue routine followup.  Plan:  CT scan of the chest in 4 months, then followup.  I spent 10 minutes with the patient today, the majority of which was spent counseling the patient on the diagnosis of cancer and coordinating care.   Jodelle Gross, M.D., Ph.D.

## 2013-11-06 NOTE — Progress Notes (Signed)
Follow up s/p rad txs 04/14/13-04/21/13 lul lung, patient on 1 liter oxygen  N/c at 95% sats, sob , here for resukts ct chest 10/31/13, occasional cough, no nausea, ,no pain in chest, legs hurt wrapped and on antibiotics for infection in w/c,  3:30 PM

## 2013-11-06 NOTE — Addendum Note (Signed)
Encounter addended by: Marye Round, MD on: 11/06/2013  3:55 PM<BR>     Documentation filed: Flowsheet VN, Orders

## 2013-11-07 ENCOUNTER — Telehealth: Payer: Self-pay | Admitting: *Deleted

## 2013-11-07 NOTE — Telephone Encounter (Signed)
CALLED PATIENT TO INFORM OF LAB, TEST AND FU VISIT, SPOKE WITH PATIENT AND SHE IS AWARE OF THESE APPTS.

## 2014-03-09 ENCOUNTER — Ambulatory Visit (HOSPITAL_COMMUNITY)
Admission: RE | Admit: 2014-03-09 | Discharge: 2014-03-09 | Disposition: A | Discharge status: Home / Self Care | Payer: Medicare Other | Source: Ambulatory Visit | Attending: Radiation Oncology | Admitting: Radiation Oncology

## 2014-03-09 ENCOUNTER — Other Ambulatory Visit: Payer: Self-pay | Admitting: Radiation Oncology

## 2014-03-09 ENCOUNTER — Ambulatory Visit
Admission: RE | Admit: 2014-03-09 | Discharge: 2014-03-09 | Disposition: A | Discharge status: Home / Self Care | Payer: Medicare Other | Source: Ambulatory Visit | Attending: Radiation Oncology | Admitting: Radiation Oncology

## 2014-03-09 DIAGNOSIS — I10 Essential (primary) hypertension: Secondary | ICD-10-CM | POA: Insufficient documentation

## 2014-03-09 DIAGNOSIS — E119 Type 2 diabetes mellitus without complications: Secondary | ICD-10-CM | POA: Diagnosis not present

## 2014-03-09 DIAGNOSIS — C3410 Malignant neoplasm of upper lobe, unspecified bronchus or lung: Secondary | ICD-10-CM | POA: Diagnosis not present

## 2014-03-09 DIAGNOSIS — Z79899 Other long term (current) drug therapy: Secondary | ICD-10-CM | POA: Diagnosis not present

## 2014-03-09 DIAGNOSIS — Z923 Personal history of irradiation: Secondary | ICD-10-CM | POA: Insufficient documentation

## 2014-03-09 LAB — BUN AND CREATININE (CC13)
BUN: 51.3 mg/dL — ABNORMAL HIGH (ref 7.0–26.0)
Creatinine: 2.1 mg/dL — ABNORMAL HIGH (ref 0.6–1.1)
EGFR: 23 mL/min/{1.73_m2} — ABNORMAL LOW (ref >90)

## 2014-03-12 ENCOUNTER — Ambulatory Visit
Admission: RE | Admit: 2014-03-12 | Discharge: 2014-03-12 | Disposition: A | Discharge status: Home / Self Care | Payer: Medicare Other | Source: Ambulatory Visit | Attending: Radiation Oncology | Admitting: Radiation Oncology

## 2014-03-12 ENCOUNTER — Encounter: Payer: Self-pay | Admitting: Radiation Oncology

## 2014-03-12 VITALS — BP 155/60 | HR 60 | Temp 98.0°F | Resp 20 | Ht 63.0 in | Wt 210.2 lb

## 2014-03-12 DIAGNOSIS — C3412 Malignant neoplasm of upper lobe, left bronchus or lung: Secondary | ICD-10-CM

## 2014-03-12 NOTE — Progress Notes (Signed)
Follow up s/p radiation lung completed 04/21/13  coughs up tannish phelgm at times, and dry cough other times stated, on 1.5 liter oxyfen sats=98%, feels like she ahas a cold, hasn't used c-pap in 2 months, needs new machine, CT Chest results from 03/09/14, weak, appetite good no nausea,  Fatigued 4:10 PM

## 2014-03-13 ENCOUNTER — Encounter: Payer: Self-pay | Admitting: Radiation Oncology

## 2014-03-13 DIAGNOSIS — C3412 Malignant neoplasm of upper lobe, left bronchus or lung: Secondary | ICD-10-CM | POA: Insufficient documentation

## 2014-03-13 NOTE — Progress Notes (Signed)
Radiation Oncology         [phone]) [phone] ________________________________  Name: Anne Sanders MRN: [id number]  Date: 03/12/2014  DOB: [birth year]  Follow-Up Visit Note  CC: Anasha Perfecto, [name], FNP  Melrose Nakayama, *  Diagnosis:   Non-small cell lung cancer involving the left upper lobe   Interval Since Last Radiation:   11 months   Narrative:  The patient returns today for routine follow-up.  The patient clinically has been stable overall since the patient was last seen in terms of respiratory symptoms. No significant discomfort or pain in the chest area. The patient has undergone a recent CT scan of the chest and presents today to review these results.                          ALLERGIES:  has No Known Allergies.  Meds: Current Outpatient Prescriptions  Medication Sig Dispense Refill  . budesonide (PULMICORT) 0.5 MG/2ML nebulizer solution Take 0.5 mg by nebulization daily as needed (for wheezing).     Marland Kitchen buPROPion (WELLBUTRIN SR) 150 MG 12 hr tablet Take 150 mg by mouth 2 (two) times daily.    . digoxin (LANOXIN) 0.125 MG tablet Take 0.125 mg by mouth 2 (two) times daily.     Marland Kitchen Fe-Succ-C-Thre-B12-Des Stomach (FERROVITE PO) Take 1 tablet by mouth 2 (two) times daily.    . fenofibrate micronized (LOFIBRA) 200 MG capsule Take 200 mg by mouth daily before breakfast.    . gabapentin (NEURONTIN) 100 MG capsule Take 100 mg by mouth 2 (two) times daily. Antibiotic started unsure name, big white pill for her infectionin her legs, sees MD Friday 11/07/13    . glimepiride (AMARYL) 4 MG tablet Take 4 mg by mouth 2 (two) times daily.    . insulin NPH Human (HUMULIN N,NOVOLIN N) 100 UNIT/ML injection Inject 45 Units into the skin daily. Increased to 90  Units daily    . ipratropium-albuterol (DUONEB) 0.5-2.5 (3) MG/3ML SOLN Take 3 mLs by nebulization every 6 (six) hours as needed (for shortness of breath).     Marland Kitchen lisinopril (PRINIVIL,ZESTRIL) 2.5 MG tablet Take 2.5 mg by mouth 2 (two) times daily.     . Melatonin 300 MCG TABS Take 1 tablet by mouth at bedtime as needed (for sleep).     . metoprolol succinate (TOPROL-XL) 100 MG 24 hr tablet Take 100 mg by mouth daily. Take with or immediately following a meal.    . Multiple Vitamin (MULTIVITAMIN) tablet Take 1 tablet by mouth daily.    Marland Kitchen omeprazole (PRILOSEC) 20 MG capsule Take 20 mg by mouth 2 (two) times daily before a meal.    . potassium chloride SA (K-DUR,KLOR-CON) 20 MEQ tablet Take 20 mEq by mouth daily.    . pravastatin (PRAVACHOL) 80 MG tablet Take 80 mg by mouth at bedtime.    Alveda Reasons 20 MG TABS tablet Take 20 mg by mouth daily.     Marland Kitchen PRESCRIPTION MEDICATION Take 1 tablet by mouth 2 (two) times daily.     No current facility-administered medications for this encounter.    Physical Findings: The patient is in no acute distress. Patient is alert and oriented.  height is 5\' 3"  (1.6 m) and weight is 210 lb 3.2 oz (95.346 kg). Her oral temperature is 98 F (36.7 C). Her blood pressure is 155/60 and her pulse is 60. Her respiration is 20 and oxygen saturation is 98%. .   Clear to auscultation bilaterally  Lab Findings: Lab Results  Component Value Date   WBC 6.9 03/20/2013   HGB 11.6* 03/20/2013   HCT 35.9* 03/20/2013   MCV 88.0 03/20/2013   PLT 130* 03/20/2013     Radiographic Findings: Ct Chest Wo Contrast  03/09/2014   CLINICAL DATA:  Restaging lung cancer diagnosed last year. Radiation therapy completed. Chemotherapy ongoing. History of hypertension and diabetes. Subsequent encounter.  EXAM: CT CHEST WITHOUT CONTRAST  TECHNIQUE: Multidetector CT imaging of the chest was performed following the standard protocol without IV contrast.  COMPARISON:  Chest CTs 10/31/2013 and 07/30/2013.  FINDINGS: Mediastinum: Small mediastinal lymph nodes are stable, including a 10 mm precarinal node on image 22. Hilar evaluation is limited by the lack of contrast, although no change apparent. The thyroid gland, trachea and esophagus  demonstrate no significant findings. The heart size is normal. There is no pericardial effusion.There is diffuse atherosclerosis of the aorta, great vessels and coronary arteries. There is stable central enlargement of the pulmonary arteries.  Lungs/Pleura: There is no pleural effusion.There is continued improvement in the left apical nodule, now measuring 1.3 x 0.7 cm on image 12. There are new multifocal patchy airspace opacities throughout both lungs, most confluent anteriorly in the upper lobes. There are paraspinal components in both lower lobes as well, but no well-defined pulmonary nodule. Calcified left lower lobe granuloma is unchanged.  Upper abdomen: Grossly stable. The liver is enlarged with diffuse low density consistent with steatosis. There are stable left greater than right adrenal nodules consistent with adenomas.  Musculoskeletal/Chest wall: There is no axillary adenopathy or chest wall mass. There are no suspicious osseous findings. 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  IMPRESSION: 1. Further regression of left apical nodule. 2. New multifocal airspace opacities in both lungs, likely inflammatory or infectious. The widespread distribution is not typical for postradiation change. Chest radiographic correlation recommended. CT follow-up will likely be necessary to assure resolution. 3. No progressive adenopathy or pleural effusion. 4. Stable hepatic steatosis and bilateral adrenal nodules.   Electronically Signed   By: Camie Patience M.D.   On: 03/09/2014 13:56    Impression:    The patient clinically is doing satisfactorily. I have personally reviewed the patient's recent CT scan of the chest. There is no evidence for recurrence/progressive disease. The left apical nodule now measures 1.3 cm. There are new multifocal airspace opacities which are likely felt to be inflammatory or infectious. These will be followed off on her future scans. We will continue  ongoing routine followup.  Plan:  The patient will undergo a repeat CT scan of the chest in 4 months with subsequent followup.  I spent 10 minutes with the patient today, the majority of which was spent counseling the patient on the diagnosis of cancer and coordinating care.   Jodelle Gross, M.D., Ph.D.

## 2014-04-30 DIAGNOSIS — J449 Chronic obstructive pulmonary disease, unspecified: Secondary | ICD-10-CM | POA: Diagnosis not present

## 2014-05-11 DIAGNOSIS — R609 Edema, unspecified: Secondary | ICD-10-CM | POA: Diagnosis not present

## 2014-05-11 DIAGNOSIS — N189 Chronic kidney disease, unspecified: Secondary | ICD-10-CM | POA: Diagnosis not present

## 2014-05-11 DIAGNOSIS — L02419 Cutaneous abscess of limb, unspecified: Secondary | ICD-10-CM | POA: Diagnosis not present

## 2014-05-13 DIAGNOSIS — L02419 Cutaneous abscess of limb, unspecified: Secondary | ICD-10-CM | POA: Diagnosis not present

## 2014-05-16 DIAGNOSIS — G4733 Obstructive sleep apnea (adult) (pediatric): Secondary | ICD-10-CM | POA: Diagnosis not present

## 2014-05-16 DIAGNOSIS — G4712 Idiopathic hypersomnia without long sleep time: Secondary | ICD-10-CM | POA: Diagnosis not present

## 2014-05-18 DIAGNOSIS — R5383 Other fatigue: Secondary | ICD-10-CM | POA: Diagnosis not present

## 2014-05-18 DIAGNOSIS — N189 Chronic kidney disease, unspecified: Secondary | ICD-10-CM | POA: Diagnosis not present

## 2014-05-18 DIAGNOSIS — I471 Supraventricular tachycardia: Secondary | ICD-10-CM | POA: Diagnosis not present

## 2014-05-18 DIAGNOSIS — R600 Localized edema: Secondary | ICD-10-CM | POA: Diagnosis not present

## 2014-05-18 DIAGNOSIS — J310 Chronic rhinitis: Secondary | ICD-10-CM | POA: Diagnosis not present

## 2014-05-18 DIAGNOSIS — J449 Chronic obstructive pulmonary disease, unspecified: Secondary | ICD-10-CM | POA: Diagnosis not present

## 2014-05-18 DIAGNOSIS — G4730 Sleep apnea, unspecified: Secondary | ICD-10-CM | POA: Diagnosis not present

## 2014-05-18 DIAGNOSIS — L02419 Cutaneous abscess of limb, unspecified: Secondary | ICD-10-CM | POA: Diagnosis not present

## 2014-05-31 DIAGNOSIS — J449 Chronic obstructive pulmonary disease, unspecified: Secondary | ICD-10-CM | POA: Diagnosis not present

## 2014-06-16 DIAGNOSIS — G4733 Obstructive sleep apnea (adult) (pediatric): Secondary | ICD-10-CM | POA: Diagnosis not present

## 2014-06-16 DIAGNOSIS — G4712 Idiopathic hypersomnia without long sleep time: Secondary | ICD-10-CM | POA: Diagnosis not present

## 2014-06-24 DIAGNOSIS — N189 Chronic kidney disease, unspecified: Secondary | ICD-10-CM | POA: Diagnosis not present

## 2014-06-24 DIAGNOSIS — D649 Anemia, unspecified: Secondary | ICD-10-CM | POA: Diagnosis not present

## 2014-06-24 DIAGNOSIS — J449 Chronic obstructive pulmonary disease, unspecified: Secondary | ICD-10-CM | POA: Diagnosis not present

## 2014-06-24 DIAGNOSIS — I471 Supraventricular tachycardia: Secondary | ICD-10-CM | POA: Diagnosis not present

## 2014-06-24 DIAGNOSIS — Z9181 History of falling: Secondary | ICD-10-CM | POA: Diagnosis not present

## 2014-06-24 DIAGNOSIS — I82409 Acute embolism and thrombosis of unspecified deep veins of unspecified lower extremity: Secondary | ICD-10-CM | POA: Diagnosis not present

## 2014-06-24 DIAGNOSIS — E1165 Type 2 diabetes mellitus with hyperglycemia: Secondary | ICD-10-CM | POA: Diagnosis not present

## 2014-06-24 DIAGNOSIS — E785 Hyperlipidemia, unspecified: Secondary | ICD-10-CM | POA: Diagnosis not present

## 2014-06-29 ENCOUNTER — Other Ambulatory Visit: Payer: Self-pay | Admitting: Radiation Oncology

## 2014-06-29 ENCOUNTER — Telehealth: Payer: Self-pay | Admitting: *Deleted

## 2014-06-29 DIAGNOSIS — C3410 Malignant neoplasm of upper lobe, unspecified bronchus or lung: Secondary | ICD-10-CM

## 2014-06-29 NOTE — Telephone Encounter (Signed)
CALLED PATIENT TO INFORM OF LAB, SCAN AND FU, SPOKE WITH PATIENT AND SHE IS AWARE OF THESE APPTS. 

## 2014-06-30 DIAGNOSIS — J449 Chronic obstructive pulmonary disease, unspecified: Secondary | ICD-10-CM | POA: Diagnosis not present

## 2014-07-02 DIAGNOSIS — R809 Proteinuria, unspecified: Secondary | ICD-10-CM | POA: Diagnosis not present

## 2014-07-07 ENCOUNTER — Ambulatory Visit (HOSPITAL_COMMUNITY)
Admission: RE | Admit: 2014-07-07 | Discharge: 2014-07-07 | Disposition: A | Discharge status: Home / Self Care | Payer: Medicare Other | Source: Ambulatory Visit | Attending: Radiation Oncology | Admitting: Radiation Oncology

## 2014-07-07 ENCOUNTER — Encounter (HOSPITAL_COMMUNITY): Payer: Self-pay

## 2014-07-07 ENCOUNTER — Ambulatory Visit
Admission: RE | Admit: 2014-07-07 | Discharge: 2014-07-07 | Disposition: A | Discharge status: Home / Self Care | Payer: Medicare Other | Source: Ambulatory Visit | Attending: Radiation Oncology | Admitting: Radiation Oncology

## 2014-07-07 DIAGNOSIS — C3410 Malignant neoplasm of upper lobe, unspecified bronchus or lung: Secondary | ICD-10-CM

## 2014-07-07 DIAGNOSIS — Z79899 Other long term (current) drug therapy: Secondary | ICD-10-CM | POA: Diagnosis not present

## 2014-07-07 DIAGNOSIS — Z923 Personal history of irradiation: Secondary | ICD-10-CM | POA: Insufficient documentation

## 2014-07-07 DIAGNOSIS — C3412 Malignant neoplasm of upper lobe, left bronchus or lung: Secondary | ICD-10-CM | POA: Insufficient documentation

## 2014-07-07 DIAGNOSIS — C3490 Malignant neoplasm of unspecified part of unspecified bronchus or lung: Secondary | ICD-10-CM | POA: Diagnosis not present

## 2014-07-07 LAB — BUN AND CREATININE (CC13)
BUN: 23.5 mg/dL (ref 7.0–26.0)
Creatinine: 1.3 mg/dL — ABNORMAL HIGH (ref 0.6–1.1)
EGFR: 41 mL/min/{1.73_m2} — ABNORMAL LOW (ref >90)

## 2014-07-07 MED ORDER — IOHEXOL 300 MG/ML  SOLN
80.0000 mL | Freq: Once | INTRAMUSCULAR | Status: AC | PRN
  Administered 2014-07-07: 100 mL via INTRAVENOUS

## 2014-07-09 ENCOUNTER — Encounter: Payer: Self-pay | Admitting: Radiation Oncology

## 2014-07-09 ENCOUNTER — Ambulatory Visit
Admission: RE | Admit: 2014-07-09 | Discharge: 2014-07-09 | Disposition: A | Discharge status: Home / Self Care | Payer: Medicare Other | Source: Ambulatory Visit | Attending: Radiation Oncology | Admitting: Radiation Oncology

## 2014-07-09 VITALS — Temp 97.6°F | Resp 20 | Ht 63.0 in | Wt 216.0 lb

## 2014-07-09 DIAGNOSIS — C3412 Malignant neoplasm of upper lobe, left bronchus or lung: Secondary | ICD-10-CM

## 2014-07-09 NOTE — Progress Notes (Signed)
Radiation Oncology         [phone]) [phone] ________________________________  Name: Anne Sanders MRN: [id number]  Date: 07/09/2014  DOB: Jun 07, 1945  Follow-Up Visit Note  CC: Deari Sessler, [name], FNP  Melrose Nakayama, *  Diagnosis:   Non-small cell lung cancer involving the left upper lobe   Interval Since Last Radiation:   The patient completed stereotactic body radiation treatment to the left upper lobe tumor on 04/21/2013   Narrative:  The patient returns today for routine follow-up.  The patient clinically denies any significant change in her breathing. She remains on supplemental oxygen. She had a recent CT scan of the chest and comes in today to review this.                         ALLERGIES:  has No Known Allergies.  Meds: Current Outpatient Prescriptions  Medication Sig Dispense Refill  . budesonide (PULMICORT) 0.5 MG/2ML nebulizer solution Take 0.5 mg by nebulization daily as needed (for wheezing).     Marland Kitchen buPROPion (WELLBUTRIN SR) 150 MG 12 hr tablet Take 150 mg by mouth 2 (two) times daily.    . digoxin (LANOXIN) 0.125 MG tablet Take 0.125 mg by mouth 2 (two) times daily.     Marland Kitchen gabapentin (NEURONTIN) 100 MG capsule Take 100 mg by mouth 2 (two) times daily. Antibiotic started unsure name, big white pill for her infectionin her legs, sees MD Friday 11/07/13    . glimepiride (AMARYL) 4 MG tablet Take 4 mg by mouth 2 (two) times daily.    . insulin NPH Human (HUMULIN N,NOVOLIN N) 100 UNIT/ML injection Inject 45 Units into the skin daily. Increased to 90  Units daily    . ipratropium-albuterol (DUONEB) 0.5-2.5 (3) MG/3ML SOLN Take 3 mLs by nebulization every 6 (six) hours as needed (for shortness of breath).     Marland Kitchen lisinopril (PRINIVIL,ZESTRIL) 2.5 MG tablet Take 2.5 mg by mouth 2 (two) times daily.    . Melatonin 300 MCG TABS Take 1 tablet by mouth at bedtime as needed (for sleep).     . metoprolol succinate (TOPROL-XL) 100 MG 24 hr tablet Take 100 mg by mouth daily. Take with or  immediately following a meal.    . Multiple Vitamin (MULTIVITAMIN) tablet Take 1 tablet by mouth daily.    Marland Kitchen omeprazole (PRILOSEC) 20 MG capsule Take 20 mg by mouth 2 (two) times daily before a meal.    . potassium chloride SA (K-DUR,KLOR-CON) 20 MEQ tablet Take 20 mEq by mouth daily.    . pravastatin (PRAVACHOL) 80 MG tablet Take 80 mg by mouth at bedtime.    Alveda Reasons 20 MG TABS tablet Take 20 mg by mouth daily.     Marland Kitchen Fe-Succ-C-Thre-B12-Des Stomach (FERROVITE PO) Take 1 tablet by mouth 2 (two) times daily.    Marland Kitchen PRESCRIPTION MEDICATION Take 1 tablet by mouth 2 (two) times daily.     No current facility-administered medications for this encounter.    Physical Findings: The patient is in no acute distress. Patient is alert and oriented.  height is '5\' 3"'$  (1.6 m) and weight is 216 lb (97.977 kg). Her oral temperature is 97.6 F (36.4 C). Her respiration is 20 and oxygen saturation is 96%. .   Clear to auscultation bilaterally  Lab Findings: Lab Results  Component Value Date   WBC 6.9 03/20/2013   HGB 11.6* 03/20/2013   HCT 35.9* 03/20/2013   MCV 88.0 03/20/2013   PLT  130* 03/20/2013     Radiographic Findings: Ct Chest W Contrast  07/07/2014   CLINICAL DATA:  Lung cancer diagnosed 2015. Chemotherapy ongoing. Radiation therapy complete.  EXAM: CT CHEST WITH CONTRAST  TECHNIQUE: Multidetector CT imaging of the chest was performed during intravenous contrast administration.  CONTRAST:  119m OMNIPAQUE IOHEXOL 300 MG/ML  SOLN  COMPARISON:  CT 03/09/2014  FINDINGS: Mediastinum/Nodes: No axillary supraclavicular adenopathy. No mediastinal hilar lymphadenopathy. No pericardial fluid. Coronary calcifications are present. Esophagus is normal. No central pulmonary embolism.  Lungs/Pleura: While there is overall improvement in nodularity and interstitial thickening in the right upper lobe, 1 nodular focus is increased in size. This nodule measures 19 x 11 mm (image 24, series 5) increased from 13 by  8 mm on 03/09/2014).  There is improvement in ground-glass opacity and consolidation in the medial aspect of the left upper lobe. Small remaining left upper lobe nodule posteriorly measures 6 mm and unchanged (image 28, series 5.  Benign calcified nodule at the left lung base again noted.  Upper abdomen: 2 cm nodule enlargement of the left adrenal gland is again noted. No change from prior. Right adrenal glands normal. No focal hepatic lesion. No upper abdominal adenopathy.  Musculoskeletal: No aggressive osseous lesion.  IMPRESSION: 1. Interval enlargement of right upper lobe nodule is concerning for lung cancer recurrence. Consider percutaneous biopsy versus restaging FDG PET scan. 2. Improvement in ground-glass opacities, interstitial thickening and nodularity in the upper lobes.   Electronically Signed   By: SSuzy BouchardM.D.   On: 07/07/2014 17:50    Impression:    The patient clinically is stable. The CT scan of the chest did not show any local recurrence in the left upper lobe. Improvement in groundglass opacities was seen. However there was interval enlargement of a right upper lobe nodule which was concerning for lung cancer.  Plan:  We will proceed with a PET scan for further information. Depending on this result, we could further discuss possible stereotactic body radiation treatment to this lesion.  I spent 15 minutes with the patient today, the majority of which was spent counseling the patient on the diagnosis of cancer and coordinating care.   JJodelle Gross M.D., Ph.D.

## 2014-07-09 NOTE — Progress Notes (Addendum)
Follow up [facility] lung 04/14/13-04/21/13, on 1.5 liters n/c oxygen sob with exertion , in w/c placed yellow band on left wrist, no coughing, appetite good, fatigued, here for results CT Chest done 07/07/14 5:05 PM Temp(Src) 97.6 F (36.4 C) (Oral)  Resp 20  Ht '5\' 3"'$  (1.6 m)  Wt 216 lb (97.977 kg)  BMI 38.27 kg/m2  SpO2 96%  Wt Readings from Last 3 Encounters:  07/09/14 216 lb (97.977 kg)  07/31/13 218 lb 12.8 oz (99.247 kg)  05/29/13 204 lb (92.534 kg)

## 2014-07-10 ENCOUNTER — Telehealth: Payer: Self-pay | Admitting: *Deleted

## 2014-07-10 NOTE — Telephone Encounter (Signed)
CALLED PATIENT TO INFORM OF SCAN AND FU, SPOKE WITH PATIENT AND SHE IS AWARE OF THIS SCAN AND THE FU VISIT WITH DR. Lisbeth Renshaw

## 2014-07-14 ENCOUNTER — Ambulatory Visit (HOSPITAL_COMMUNITY)
Admission: RE | Admit: 2014-07-14 | Discharge: 2014-07-14 | Disposition: A | Discharge status: Home / Self Care | Payer: Medicare Other | Source: Ambulatory Visit | Attending: Radiation Oncology | Admitting: Radiation Oncology

## 2014-07-14 DIAGNOSIS — C3490 Malignant neoplasm of unspecified part of unspecified bronchus or lung: Secondary | ICD-10-CM | POA: Diagnosis not present

## 2014-07-14 DIAGNOSIS — D3501 Benign neoplasm of right adrenal gland: Secondary | ICD-10-CM | POA: Diagnosis not present

## 2014-07-14 DIAGNOSIS — C3412 Malignant neoplasm of upper lobe, left bronchus or lung: Secondary | ICD-10-CM

## 2014-07-14 DIAGNOSIS — D3502 Benign neoplasm of left adrenal gland: Secondary | ICD-10-CM | POA: Diagnosis not present

## 2014-07-14 LAB — GLUCOSE, CAPILLARY: Glucose-Capillary: 143 mg/dL — ABNORMAL HIGH (ref 65–99)

## 2014-07-14 MED ORDER — FLUDEOXYGLUCOSE F - 18 (FDG) INJECTION: 10.0000 | Freq: Once | INTRAVENOUS | Status: AC | PRN

## 2014-07-16 DIAGNOSIS — G4733 Obstructive sleep apnea (adult) (pediatric): Secondary | ICD-10-CM | POA: Diagnosis not present

## 2014-07-16 DIAGNOSIS — G4712 Idiopathic hypersomnia without long sleep time: Secondary | ICD-10-CM | POA: Diagnosis not present

## 2014-07-17 ENCOUNTER — Ambulatory Visit: Payer: Self-pay | Admitting: Radiation Oncology

## 2014-07-24 ENCOUNTER — Ambulatory Visit
Admission: RE | Admit: 2014-07-24 | Discharge: 2014-07-24 | Disposition: A | Discharge status: Home / Self Care | Payer: Medicare Other | Source: Ambulatory Visit | Attending: Radiation Oncology | Admitting: Radiation Oncology

## 2014-07-24 ENCOUNTER — Encounter: Payer: Self-pay | Admitting: Radiation Oncology

## 2014-07-24 VITALS — BP 135/63 | HR 63 | Temp 98.3°F | Resp 20 | Wt 217.4 lb

## 2014-07-24 DIAGNOSIS — C3412 Malignant neoplasm of upper lobe, left bronchus or lung: Secondary | ICD-10-CM | POA: Diagnosis not present

## 2014-07-24 NOTE — Progress Notes (Signed)
Radiation Oncology         [phone]) [phone] ________________________________  Name: Anne Sanders MRN: [id number]  Date: 07/24/2014  DOB: June 07, 1945  Follow-Up Visit Note  CC: Kaulin Chaves, [name], FNP  Melrose Nakayama, *  Diagnosis:   Non-small cell lung cancer involving the left upper lobe   Interval Since Last Radiation:   The patient completed stereotactic body radiation treatment to the left upper lobe tumor on 04/21/2013   Narrative:  The patient returns today for routine follow-up.    Ms.. Clair Gulling here today for reassessment.  She is traveling by wheelchair, and is using 02 at 2 liters/min.  She denies any pain at this time. BP 135/63 mmHg  Pulse 63  Temp(Src) 98.3 F (36.8 C)  Resp 20  Wt 217 lb 6.4 oz (98.612 kg)  SpO2 93%   The patient is doing well. No new difficulties                        ALLERGIES:  has No Known Allergies.  Meds: Current Outpatient Prescriptions  Medication Sig Dispense Refill  . budesonide (PULMICORT) 0.5 MG/2ML nebulizer solution Take 0.5 mg by nebulization daily as needed (for wheezing).     Marland Kitchen buPROPion (WELLBUTRIN SR) 150 MG 12 hr tablet Take 150 mg by mouth 2 (two) times daily.    . digoxin (LANOXIN) 0.125 MG tablet Take 0.125 mg by mouth 2 (two) times daily.     Marland Kitchen gabapentin (NEURONTIN) 100 MG capsule Take 100 mg by mouth 2 (two) times daily. Antibiotic started unsure name, big white pill for her infectionin her legs, sees MD Friday 11/07/13    . glimepiride (AMARYL) 4 MG tablet Take 4 mg by mouth 2 (two) times daily.    . insulin NPH Human (HUMULIN N,NOVOLIN N) 100 UNIT/ML injection Inject 45 Units into the skin daily. Increased to 90  Units daily    . ipratropium-albuterol (DUONEB) 0.5-2.5 (3) MG/3ML SOLN Take 3 mLs by nebulization every 6 (six) hours as needed (for shortness of breath).     Marland Kitchen lisinopril (PRINIVIL,ZESTRIL) 2.5 MG tablet Take 2.5 mg by mouth 2 (two) times daily.    . Melatonin 300 MCG TABS Take 1 tablet by mouth at bedtime  as needed (for sleep).     . metoprolol succinate (TOPROL-XL) 100 MG 24 hr tablet Take 100 mg by mouth daily. Take with or immediately following a meal.    . Multiple Vitamin (MULTIVITAMIN) tablet Take 1 tablet by mouth daily.    Marland Kitchen omeprazole (PRILOSEC) 20 MG capsule Take 20 mg by mouth 2 (two) times daily before a meal.    . potassium chloride SA (K-DUR,KLOR-CON) 20 MEQ tablet Take 20 mEq by mouth daily.    . pravastatin (PRAVACHOL) 80 MG tablet Take 80 mg by mouth at bedtime.    Alveda Reasons 20 MG TABS tablet Take 20 mg by mouth daily.     Marland Kitchen PRESCRIPTION MEDICATION Take 1 tablet by mouth 2 (two) times daily.     No current facility-administered medications for this encounter.    Physical Findings: The patient is in no acute distress. Patient is alert and oriented.  weight is 217 lb 6.4 oz (98.612 kg). Her temperature is 98.3 F (36.8 C). Her blood pressure is 135/63 and her pulse is 63. Her respiration is 20 and oxygen saturation is 93%. .     Lab Findings: Lab Results  Component Value Date   WBC 6.9 03/20/2013  HGB 11.6* 03/20/2013   HCT 35.9* 03/20/2013   MCV 88.0 03/20/2013   PLT 130* 03/20/2013     Radiographic Findings: Ct Chest W Contrast  07/07/2014   CLINICAL DATA:  Lung cancer diagnosed 2015. Chemotherapy ongoing. Radiation therapy complete.  EXAM: CT CHEST WITH CONTRAST  TECHNIQUE: Multidetector CT imaging of the chest was performed during intravenous contrast administration.  CONTRAST:  170m OMNIPAQUE IOHEXOL 300 MG/ML  SOLN  COMPARISON:  CT 03/09/2014  FINDINGS: Mediastinum/Nodes: No axillary supraclavicular adenopathy. No mediastinal hilar lymphadenopathy. No pericardial fluid. Coronary calcifications are present. Esophagus is normal. No central pulmonary embolism.  Lungs/Pleura: While there is overall improvement in nodularity and interstitial thickening in the right upper lobe, 1 nodular focus is increased in size. This nodule measures 19 x 11 mm (image 24, series 5)  increased from 13 by 8 mm on 03/09/2014).  There is improvement in ground-glass opacity and consolidation in the medial aspect of the left upper lobe. Small remaining left upper lobe nodule posteriorly measures 6 mm and unchanged (image 28, series 5.  Benign calcified nodule at the left lung base again noted.  Upper abdomen: 2 cm nodule enlargement of the left adrenal gland is again noted. No change from prior. Right adrenal glands normal. No focal hepatic lesion. No upper abdominal adenopathy.  Musculoskeletal: No aggressive osseous lesion.  IMPRESSION: 1. Interval enlargement of right upper lobe nodule is concerning for lung cancer recurrence. Consider percutaneous biopsy versus restaging FDG PET scan. 2. Improvement in ground-glass opacities, interstitial thickening and nodularity in the upper lobes.   Electronically Signed   By: SSuzy BouchardM.D.   On: 07/07/2014 17:50   Nm Pet Image Restag (ps) Skull Base To Thigh  07/14/2014   CLINICAL DATA:  Subsequent treatment strategy for lung cancer.  EXAM: NUCLEAR MEDICINE PET SKULL BASE TO THIGH  TECHNIQUE: 10.0 mCi F-18 FDG was injected intravenously. Full-ring PET imaging was performed from the skull base to thigh after the radiotracer. CT data was obtained and used for attenuation correction and anatomic localization.  FASTING BLOOD GLUCOSE:  Value: 147 mg/dl  COMPARISON:  Multiple prior chest CTs.  PET-CT 10/29/2012  FINDINGS: NECK  No hypermetabolic lymph nodes in the neck.  CHEST  Irregular nodular lesion in the right upper lobe described on the prior CT scan is hypermetabolic with SUV max of 38.84 This is concerning for neoplasm. Stable nodularity and diffuse ground-glass appearance of the left upper lobe without significant hypermetabolism. SUV max is 1.7. 7.5 mm nodular density in the lingula is not metabolically active.  Stable calcified granuloma in the left lower lobe. No enlarged or hypermetabolic mediastinal or hilar lymph nodes. Stable three-vessel  coronary artery calcifications.  ABDOMEN/PELVIS  No abnormal hypermetabolic activity within the liver, pancreas, adrenal glands, or spleen. No hypermetabolic lymph nodes in the abdomen or pelvis. Stable bilateral adrenal gland lesions which are not hypermetabolic and consistent with benign adenomas.  SKELETON  No focal hypermetabolic activity to suggest skeletal metastasis. Slight increased uptake noted at the rib articulations with the transverse processes in the mid thoracic spine on the right side but no definite bony metastasis.  IMPRESSION: 1. 18 mm right upper lobe lung lesion is hypermetabolic and concerning for neoplasm. 2. Stable nodularity and ground-glass appearance of the left upper lobe. No worrisome hypermetabolism. Other small nodules are also stable. 3. No enlarged or hypermetabolic mediastinal or hilar lymph nodes. 4. Stable bilateral adrenal gland adenomas.   Electronically Signed   By: PRicky StabsD.  On: 07/14/2014 16:26    Impression:    The patient clinically is stable. The patient came in today to discuss the results of the PET scan. This was positive in the right lung and this again appears to be a good candidate for stereotactic body radiation treatment. I discussed this with the patient today and the logistics of treatment.   Plan:  The patient will proceed with a simulation in the near future such that we can proceed with treatment planning. I anticipate a 3-5 course of radiation treatment to the right lung. She does have a history of treatment to the left lung.   I spent 10 minutes with the patient today, the majority of which was spent counseling the patient on the diagnosis of cancer and coordinating care.   Jodelle Gross, M.D., Ph.D.

## 2014-07-24 NOTE — Progress Notes (Signed)
Ms.. Anne Sanders here today for reassessment.  She is traveling by wheelchair, and is using 02 at 2 liters/min.  She denies any pain at this time. BP 135/63 mmHg  Pulse 63  Temp(Src) 98.3 F (36.8 C)  Resp 20  Wt 217 lb 6.4 oz (98.612 kg)  SpO2 93%

## 2014-07-31 DIAGNOSIS — J449 Chronic obstructive pulmonary disease, unspecified: Secondary | ICD-10-CM | POA: Diagnosis not present

## 2014-08-05 ENCOUNTER — Ambulatory Visit
Admission: RE | Admit: 2014-08-05 | Discharge: 2014-08-05 | Disposition: A | Discharge status: Home / Self Care | Payer: Medicare Other | Source: Ambulatory Visit | Attending: Radiation Oncology | Admitting: Radiation Oncology

## 2014-08-05 ENCOUNTER — Encounter: Payer: Self-pay | Admitting: Radiation Oncology

## 2014-08-05 DIAGNOSIS — Z510 Encounter for antineoplastic radiation therapy: Secondary | ICD-10-CM | POA: Diagnosis not present

## 2014-08-05 DIAGNOSIS — C3412 Malignant neoplasm of upper lobe, left bronchus or lung: Secondary | ICD-10-CM | POA: Diagnosis not present

## 2014-08-05 DIAGNOSIS — C3411 Malignant neoplasm of upper lobe, right bronchus or lung: Secondary | ICD-10-CM | POA: Diagnosis not present

## 2014-08-05 MED ORDER — LORAZEPAM 0.5 MG PO TABS
0.5000 mg | ORAL_TABLET | Freq: Once | ORAL | Status: AC
  Administered 2014-08-05: 0.5 mg via ORAL
  Filled 2014-08-05: qty 1

## 2014-08-05 NOTE — Progress Notes (Addendum)
Verbal orders by Dr. Lisbeth Renshaw to give patient 0.'5mg'$  ativan sl x1 now, verbal read back, verified dosage with Sherol Dade, asked patient name and dob as identification, patient nervous before ct simulation, gave 0.'5mg'$  ativan sl x 1 with sips water, emphasized to patient no driving today with taking ativan, patient gave verbal understanding  Stated"My daughter will be driving me back to [place], no, I won't drive" 4:94 PM

## 2014-08-09 DIAGNOSIS — L03113 Cellulitis of right upper limb: Secondary | ICD-10-CM | POA: Diagnosis not present

## 2014-08-09 DIAGNOSIS — M7989 Other specified soft tissue disorders: Secondary | ICD-10-CM | POA: Diagnosis not present

## 2014-08-09 DIAGNOSIS — M79641 Pain in right hand: Secondary | ICD-10-CM | POA: Diagnosis not present

## 2014-08-09 DIAGNOSIS — S6991XA Unspecified injury of right wrist, hand and finger(s), initial encounter: Secondary | ICD-10-CM | POA: Diagnosis not present

## 2014-08-16 DIAGNOSIS — G4733 Obstructive sleep apnea (adult) (pediatric): Secondary | ICD-10-CM | POA: Diagnosis not present

## 2014-08-16 DIAGNOSIS — G4712 Idiopathic hypersomnia without long sleep time: Secondary | ICD-10-CM | POA: Diagnosis not present

## 2014-08-27 DIAGNOSIS — C3412 Malignant neoplasm of upper lobe, left bronchus or lung: Secondary | ICD-10-CM | POA: Diagnosis not present

## 2014-08-27 DIAGNOSIS — C3411 Malignant neoplasm of upper lobe, right bronchus or lung: Secondary | ICD-10-CM | POA: Diagnosis not present

## 2014-08-27 DIAGNOSIS — Z510 Encounter for antineoplastic radiation therapy: Secondary | ICD-10-CM | POA: Diagnosis not present

## 2014-08-30 DIAGNOSIS — J449 Chronic obstructive pulmonary disease, unspecified: Secondary | ICD-10-CM | POA: Diagnosis not present

## 2014-09-02 ENCOUNTER — Ambulatory Visit
Admission: RE | Admit: 2014-09-02 | Discharge: 2014-09-02 | Disposition: A | Discharge status: Home / Self Care | Payer: Medicare Other | Source: Ambulatory Visit | Attending: Radiation Oncology | Admitting: Radiation Oncology

## 2014-09-02 ENCOUNTER — Encounter: Payer: Self-pay | Admitting: Radiation Oncology

## 2014-09-02 VITALS — BP 164/81 | HR 78

## 2014-09-02 DIAGNOSIS — C3412 Malignant neoplasm of upper lobe, left bronchus or lung: Secondary | ICD-10-CM

## 2014-09-02 DIAGNOSIS — Z510 Encounter for antineoplastic radiation therapy: Secondary | ICD-10-CM | POA: Diagnosis not present

## 2014-09-02 DIAGNOSIS — C3411 Malignant neoplasm of upper lobe, right bronchus or lung: Secondary | ICD-10-CM | POA: Diagnosis not present

## 2014-09-02 MED ORDER — LORAZEPAM 0.5 MG PO TABS
0.5000 mg | ORAL_TABLET | Freq: Once | ORAL | Status: AC
  Administered 2014-09-02: 0.5 mg via SUBLINGUAL
  Filled 2014-09-02: qty 1

## 2014-09-02 MED ORDER — LORAZEPAM 0.5 MG PO TABS: 0.5000 mg | ORAL_TABLET | Freq: Once | ORAL | Status: AC | PRN

## 2014-09-02 NOTE — Progress Notes (Signed)
   Radiation Oncology         ([phone]) [phone] ________________________________  Name: Anne Sanders MRN: [id number]  Date: 09/02/2014  DOB: [birth year]  Stereotactic Body Radiotherapy Treatment Procedure Note   NARRATIVE: Anne Sanders was brought to the stereotactic radiation treatment machine and placed supine on the CT couch. The patient was set up for stereotactic body radiotherapy on the body fix device.   3D TREATMENT PLANNING AND DOSIMETRY: The patient's radiation plan was reviewed and approved prior to starting treatment. It showed 3-dimensional radiation distributions overlaid onto the planning CT. [facility] for the target structures as well as the organs at risk were reviewed. The documentation of this is filed in the radiation oncology EMR.   SIMULATION VERIFICATION: The patient underwent CT imaging on the treatment unit. These were carefully aligned to document that the ablative radiation dose would cover the target volume and maximally spare the nearby organs at risk according to the planned distribution.   SPECIAL TREATMENT PROCEDURE: Anne Sanders received high dose ablative stereotactic body radiotherapy to the planned target volume without unforeseen complications. Treatment was delivered uneventfully. The high doses associated with stereotactic body radiotherapy and the significant potential risks require careful treatment set up and patient monitoring constituting a special treatment procedure.   STEREOTACTIC TREATMENT MANAGEMENT: Following delivery, the patient was evaluated clinically. The patient tolerated treatment without significant acute effects, and was discharged to home in stable condition.   Fraction: 1  Dose:  18 Gy  PLAN: Continue treatment as planned.   ________________________________  Jodelle Gross, MD, PhD

## 2014-09-02 NOTE — Progress Notes (Signed)
Anne Sanders said she is feeling better and would like to try treatment again.

## 2014-09-02 NOTE — Progress Notes (Signed)
Patient reports feeling short of breath when lying on the treatment table.  Her oxygen saturation on 2 L is 97%.  Notified Dr. Lisbeth Renshaw.  Gave patient 0.5 mg Ativan sl per Dr. Lisbeth Renshaw.  Will continue to monitor.

## 2014-09-03 NOTE — Progress Notes (Signed)
Department of Radiation Oncology  Phone:  [phone] Fax:        [phone]  Weekly Treatment Note    Name: Anne Sanders Date: 09/03/2014 MRN: [id number] DOB: [birth year]   Current dose: 18 Gy  Current fraction: 1   MEDICATIONS: Current Outpatient Prescriptions  Medication Sig Dispense Refill  . budesonide (PULMICORT) 0.5 MG/2ML nebulizer solution Take 0.5 mg by nebulization daily as needed (for wheezing).     Marland Kitchen buPROPion (WELLBUTRIN SR) 150 MG 12 hr tablet Take 150 mg by mouth 2 (two) times daily.    . digoxin (LANOXIN) 0.125 MG tablet Take 0.125 mg by mouth 2 (two) times daily.     Marland Kitchen gabapentin (NEURONTIN) 100 MG capsule Take 100 mg by mouth 2 (two) times daily. Antibiotic started unsure name, big white pill for her infectionin her legs, sees MD Friday 11/07/13    . glimepiride (AMARYL) 4 MG tablet Take 4 mg by mouth 2 (two) times daily.    . insulin NPH Human (HUMULIN N,NOVOLIN N) 100 UNIT/ML injection Inject 45 Units into the skin daily. Increased to 90  Units daily    . ipratropium-albuterol (DUONEB) 0.5-2.5 (3) MG/3ML SOLN Take 3 mLs by nebulization every 6 (six) hours as needed (for shortness of breath).     Marland Kitchen lisinopril (PRINIVIL,ZESTRIL) 2.5 MG tablet Take 2.5 mg by mouth 2 (two) times daily.    Marland Kitchen LORazepam (ATIVAN) 0.5 MG tablet Take 1 tablet (0.5 mg total) by mouth once as needed for anxiety. Take 30 min prior to radiation treatment. 5 tablet 0  . Melatonin 300 MCG TABS Take 1 tablet by mouth at bedtime as needed (for sleep).     . metoprolol succinate (TOPROL-XL) 100 MG 24 hr tablet Take 100 mg by mouth daily. Take with or immediately following a meal.    . Multiple Vitamin (MULTIVITAMIN) tablet Take 1 tablet by mouth daily.    Marland Kitchen omeprazole (PRILOSEC) 20 MG capsule Take 20 mg by mouth 2 (two) times daily before a meal.    . potassium chloride SA (K-DUR,KLOR-CON) 20 MEQ tablet Take 20 mEq by mouth daily.    . pravastatin (PRAVACHOL) 80 MG tablet Take 80 mg by mouth at  bedtime.    Marland Kitchen PRESCRIPTION MEDICATION Take 1 tablet by mouth 2 (two) times daily.    Alveda Reasons 20 MG TABS tablet Take 20 mg by mouth daily.      No current facility-administered medications for this encounter.     ALLERGIES: Review of patient's allergies indicates no known allergies.   LABORATORY DATA:  Lab Results  Component Value Date   WBC 6.9 03/20/2013   HGB 11.6* 03/20/2013   HCT 35.9* 03/20/2013   MCV 88.0 03/20/2013   PLT 130* 03/20/2013   Lab Results  Component Value Date   NA 138 03/20/2013   K 4.7 03/20/2013   CL 96 03/20/2013   CO2 29 03/20/2013   Lab Results  Component Value Date   ALT 19 03/20/2013   AST 22 03/20/2013   ALKPHOS 66 03/20/2013   BILITOT 0.3 03/20/2013     NARRATIVE: Anjel Perfetti was seen today for weekly treatment management. The chart was checked and the patient's films were reviewed.  The patient has done very well with her first fraction of radiation treatment. No difficulties thus far.  PHYSICAL EXAMINATION: blood pressure is 164/81 and her pulse is 78. Her oxygen saturation is 97%.        ASSESSMENT: The patient is doing satisfactorily with  treatment.  PLAN: We will continue with the patient's radiation treatment as planned. Driving

## 2014-09-03 NOTE — Addendum Note (Signed)
Encounter addended by: Kyung Rudd, MD on: 09/03/2014  6:32 PM<BR>     Documentation filed: Notes Section

## 2014-09-04 ENCOUNTER — Ambulatory Visit
Admission: RE | Admit: 2014-09-04 | Discharge: 2014-09-04 | Disposition: A | Discharge status: Home / Self Care | Payer: Medicare Other | Source: Ambulatory Visit | Attending: Radiation Oncology | Admitting: Radiation Oncology

## 2014-09-04 DIAGNOSIS — C3412 Malignant neoplasm of upper lobe, left bronchus or lung: Secondary | ICD-10-CM

## 2014-09-04 DIAGNOSIS — Z510 Encounter for antineoplastic radiation therapy: Secondary | ICD-10-CM | POA: Diagnosis not present

## 2014-09-09 ENCOUNTER — Ambulatory Visit
Admission: RE | Admit: 2014-09-09 | Discharge: 2014-09-09 | Disposition: A | Discharge status: Home / Self Care | Payer: Medicare Other | Source: Ambulatory Visit | Attending: Radiation Oncology | Admitting: Radiation Oncology

## 2014-09-09 DIAGNOSIS — Z510 Encounter for antineoplastic radiation therapy: Secondary | ICD-10-CM | POA: Diagnosis not present

## 2014-09-09 DIAGNOSIS — C3412 Malignant neoplasm of upper lobe, left bronchus or lung: Secondary | ICD-10-CM

## 2014-09-09 NOTE — Progress Notes (Signed)
  Radiation Oncology         [phone] ________________________________  Name: [name] MRN: [id number]  Date: 09/09/2014  DOB: 08/09/1945  Stereotactic Body Radiotherapy Treatment Procedure Note Planned Dose: 54 Gy Fractions: 3/3  NARRATIVE:  Anne Sanders was brought to the stereotactic radiation treatment machine and placed supine on the CT couch. The patient was set up for stereotactic body radiotherapy on the body fix pillow.  3D TREATMENT PLANNING AND DOSIMETRY:  The patient's radiation plan was reviewed and approved prior to starting treatment.  It showed 3-dimensional radiation distributions overlaid onto the planning CT.  The [facility] for the target structures as well as the organs at risk were reviewed. The documentation of this is filed in the radiation oncology EMR.  SIMULATION VERIFICATION:  The patient underwent CT imaging on the treatment unit.  These were carefully aligned to document that the ablative radiation dose would cover the target volume and maximally spare the nearby organs at risk according to the planned distribution.  SPECIAL TREATMENT PROCEDURE: [name] received high dose ablative stereotactic body radiotherapy to the planned target volume without unforeseen complications. Treatment was delivered uneventfully. The high doses associated with stereotactic body radiotherapy and the significant potential risks require careful treatment set up and patient monitoring constituting a special treatment procedure   STEREOTACTIC TREATMENT MANAGEMENT:  Following delivery, the patient was evaluated clinically. The patient tolerated treatment without significant acute effects, and was discharged to home in stable condition.    PLAN: Completed SBRT treatment. Will follow up with Dr. Lisbeth Renshaw in 1 month.  This document serves as a record of services personally performed by Gery Pray, MD. It was created on his behalf by Darcus Austin, a trained medical scribe. The creation of  this record is based on the scribe's personal observations and the provider's statements to them. This document has been checked and approved by the attending provider.  ________________________________  Blair Promise, PhD, MD

## 2014-09-11 ENCOUNTER — Encounter: Payer: Self-pay | Admitting: Radiation Oncology

## 2014-09-11 DIAGNOSIS — C3411 Malignant neoplasm of upper lobe, right bronchus or lung: Secondary | ICD-10-CM

## 2014-09-15 DIAGNOSIS — G4712 Idiopathic hypersomnia without long sleep time: Secondary | ICD-10-CM | POA: Diagnosis not present

## 2014-09-15 DIAGNOSIS — G4733 Obstructive sleep apnea (adult) (pediatric): Secondary | ICD-10-CM | POA: Diagnosis not present

## 2014-09-30 DIAGNOSIS — J449 Chronic obstructive pulmonary disease, unspecified: Secondary | ICD-10-CM | POA: Diagnosis not present

## 2014-10-05 DIAGNOSIS — F419 Anxiety disorder, unspecified: Secondary | ICD-10-CM | POA: Diagnosis not present

## 2014-10-05 DIAGNOSIS — E1165 Type 2 diabetes mellitus with hyperglycemia: Secondary | ICD-10-CM | POA: Diagnosis not present

## 2014-10-05 DIAGNOSIS — I10 Essential (primary) hypertension: Secondary | ICD-10-CM | POA: Diagnosis not present

## 2014-10-05 DIAGNOSIS — E785 Hyperlipidemia, unspecified: Secondary | ICD-10-CM | POA: Diagnosis not present

## 2014-10-05 DIAGNOSIS — I82409 Acute embolism and thrombosis of unspecified deep veins of unspecified lower extremity: Secondary | ICD-10-CM | POA: Diagnosis not present

## 2014-10-05 DIAGNOSIS — I471 Supraventricular tachycardia: Secondary | ICD-10-CM | POA: Diagnosis not present

## 2014-10-05 DIAGNOSIS — C3490 Malignant neoplasm of unspecified part of unspecified bronchus or lung: Secondary | ICD-10-CM | POA: Diagnosis not present

## 2014-10-16 DIAGNOSIS — G4712 Idiopathic hypersomnia without long sleep time: Secondary | ICD-10-CM | POA: Diagnosis not present

## 2014-10-16 DIAGNOSIS — G4733 Obstructive sleep apnea (adult) (pediatric): Secondary | ICD-10-CM | POA: Diagnosis not present

## 2014-10-20 DIAGNOSIS — Z6838 Body mass index (BMI) 38.0-38.9, adult: Secondary | ICD-10-CM | POA: Diagnosis not present

## 2014-10-20 DIAGNOSIS — E1165 Type 2 diabetes mellitus with hyperglycemia: Secondary | ICD-10-CM | POA: Diagnosis not present

## 2014-10-20 DIAGNOSIS — N189 Chronic kidney disease, unspecified: Secondary | ICD-10-CM | POA: Diagnosis not present

## 2014-10-22 ENCOUNTER — Encounter: Payer: Self-pay | Admitting: Radiation Oncology

## 2014-10-26 DIAGNOSIS — C3411 Malignant neoplasm of upper lobe, right bronchus or lung: Secondary | ICD-10-CM | POA: Insufficient documentation

## 2014-10-26 NOTE — Addendum Note (Signed)
Encounter addended by: Kyung Rudd, MD on: 10/26/2014 10:03 PM<BR>     Documentation filed: Notes Section

## 2014-10-26 NOTE — Progress Notes (Signed)
  Radiation Oncology         [phone] ________________________________  Name: Anne Sanders MRN: [id number]  Date: 09/11/2014  DOB: 1945/04/18  End of Treatment Note  Diagnosis:   Non-small cell lung cancer     Indication for treatment:  Curative       Radiation treatment dates:   09/02/2014 through 09/09/2014  Site/dose:   The tumor in the right upper lobe was treated with a course of stereotactic body radiation treatment. The patient received 54 Gy In 3 fractions at 18 G per fraction.  Narrative: The patient tolerated radiation treatment relatively well.   The patient did not have any signs of acute toxicity during treatment.  Plan: The patient has completed radiation treatment. The patient will return to radiation oncology clinic for routine followup in one month. I advised the patient to call or return sooner if they have any questions or concerns related to their recovery or treatment.   ------------------------------------------------  Jodelle Gross, MD, PhD

## 2014-10-26 NOTE — Addendum Note (Signed)
Encounter addended by: Kyung Rudd, MD on: 10/26/2014  9:55 PM<BR>     Documentation filed: Notes Section

## 2014-10-26 NOTE — Progress Notes (Signed)
   Radiation Oncology         [phone] ________________________________  Name: Anne Sanders MRN: [id number]  Date: 09/04/2014  DOB: 1945-02-21  Stereotactic Body Radiotherapy Treatment Procedure Note   NARRATIVE: Anne Sanders was brought to the stereotactic radiation treatment machine and placed supine on the CT couch. The patient was set up for stereotactic body radiotherapy on the body fix device.   3D TREATMENT PLANNING AND DOSIMETRY: The patient's radiation plan was reviewed and approved prior to starting treatment. It showed 3-dimensional radiation distributions overlaid onto the planning CT. The [facility] for the target structures as well as the organs at risk were reviewed. The documentation of this is filed in the radiation oncology EMR.   SIMULATION VERIFICATION: The patient underwent CT imaging on the treatment unit. These were carefully aligned to document that the ablative radiation dose would cover the target volume and maximally spare the nearby organs at risk according to the planned distribution.   SPECIAL TREATMENT PROCEDURE: Anne Sanders received high dose ablative stereotactic body radiotherapy to the planned target volume without unforeseen complications. Treatment was delivered uneventfully. The high doses associated with stereotactic body radiotherapy and the significant potential risks require careful treatment set up and patient monitoring constituting a special treatment procedure.   STEREOTACTIC TREATMENT MANAGEMENT: Following delivery, the patient was evaluated clinically. The patient tolerated treatment without significant acute effects, and was discharged to home in stable condition.   Fraction: 2  Dose:  36 Gy  PLAN: Continue treatment as planned.   ________________________________  Jodelle Gross, MD, PhD

## 2014-10-26 NOTE — Progress Notes (Addendum)
[facility] Radiation Oncology Simulation and Treatment Planning Note   MRN: [id number]   Date: 10/26/2014  DOB: [birth year]  Status:outpatient    DIAGNOSIS:    ICD-9-CM ICD-10-CM   1. Primary malignant neoplasm of left upper lobe of lung 162.3 C34.12 LORazepam (ATIVAN) tablet 0.5 mg      CONSENT VERIFIED:yes   SET UP: Patient is setup supine   IMMOBILIZATION: The patient was immobilized using a Vac Loc bag and Abdominal Compression.   NARRATIVE:The patient was brought to the [facility].  Identity was confirmed.  All relevant records and images related to the planned course of therapy were reviewed.  Then, the patient was positioned in a stable reproducible clinical set-up for radiation therapy. Abdominal compression was applied by me.  4D CT images were obtained and reproducible breathing pattern was confirmed. Free breathing CT images were obtained.  Skin markings were placed.  The CT images were loaded into the planning software where the target and avoidance structures were contoured.  The radiation prescription was entered and confirmed.    TREATMENT PLANNING NOTE:  Treatment planning then occurred. I have requested : MLC's, isodose plan, basic dose calculation.  3 dimensional simulation is performed and dose volume histogram of the gross tumor volume, planning tumor volume and criticial normal structures including the spinal cord and lungs were analyzed and requested.  Special treatment procedure was performed due to high dose per fraction.  The patient will be monitored for increased risk of toxicity.  Daily imaging using cone beam CT will be used for target localization.   ------------------------------------------------  Jodelle Gross, MD, PhD

## 2014-10-26 NOTE — Addendum Note (Signed)
Encounter addended by: Kyung Rudd, MD on: 10/26/2014  9:58 PM<BR>     Documentation filed: Notes Section, Visit Diagnoses

## 2014-10-28 ENCOUNTER — Ambulatory Visit
Admission: RE | Admit: 2014-10-28 | Discharge: 2014-10-28 | Disposition: A | Discharge status: Home / Self Care | Payer: Medicare Other | Source: Ambulatory Visit | Attending: Radiation Oncology | Admitting: Radiation Oncology

## 2014-10-28 ENCOUNTER — Encounter: Payer: Self-pay | Admitting: Radiation Oncology

## 2014-10-28 VITALS — BP 174/68 | HR 54 | Temp 98.5°F | Resp 22 | Ht 63.0 in | Wt 218.9 lb

## 2014-10-28 DIAGNOSIS — C3411 Malignant neoplasm of upper lobe, right bronchus or lung: Secondary | ICD-10-CM

## 2014-10-28 NOTE — Progress Notes (Signed)
Follow up SBRT

## 2014-10-28 NOTE — Progress Notes (Signed)
Follow up s/p SBRT 3/3 fxs, completed 7/210/16, on 2 liters n/c osygen=94%, coughs up at times white or green phelgm, appetie good, energy level fatigued, breathing sob with mild exertion,  In w/c, no nausea,  2:10 PM BP 174/68 mmHg  Pulse 54  Temp(Src) 98.5 F (36.9 C) (Oral)  Resp 22  Ht '5\' 3"'$  (1.6 m)  Wt 218 lb 14.4 oz (99.292 kg)  BMI 38.79 kg/m2  SpO2 94%  Wt Readings from Last 3 Encounters:  10/28/14 218 lb 14.4 oz (99.292 kg)  07/24/14 217 lb 6.4 oz (98.612 kg)  07/09/14 216 lb (97.977 kg)

## 2014-10-28 NOTE — Progress Notes (Signed)
Radiation Oncology         [phone] ________________________________  Name: Anne Sanders MRN: [id number]  Date: 10/28/2014  DOB: Aug 19, 1945  Follow-Up Visit Note   CC: Maximiano Lott, [name], FNP  Theola Sequin B, FNP  Diagnosis: Non-small cell lung cancer     Interval Since Last Radiation:  1 months   Narrative:  The patient returns today for routine follow-up.  Follow up s/p SBRT 3/3 fxs, completed 08/2014, on 2 liters n/c oxygen=94%, coughs up at times white or green phelgm, appetie good, energy level fatigued, breathing sob with mild exertion, In w/c, no nausea. Denies pain in chest area.                               ALLERGIES:  has No Known Allergies.  Meds: Current Outpatient Prescriptions  Medication Sig Dispense Refill  . budesonide (PULMICORT) 0.5 MG/2ML nebulizer solution Take 0.5 mg by nebulization daily as needed (for wheezing).     Marland Kitchen buPROPion (WELLBUTRIN SR) 150 MG 12 hr tablet Take 150 mg by mouth 2 (two) times daily.    . digoxin (LANOXIN) 0.125 MG tablet Take 0.125 mg by mouth 2 (two) times daily.     Marland Kitchen gabapentin (NEURONTIN) 100 MG capsule Take 100 mg by mouth 2 (two) times daily. Antibiotic started unsure name, big white pill for her infectionin her legs, sees MD Friday 11/07/13    . glimepiride (AMARYL) 4 MG tablet Take 4 mg by mouth 2 (two) times daily.    . insulin NPH Human (HUMULIN N,NOVOLIN N) 100 UNIT/ML injection Inject 45 Units into the skin daily. Increased to 90  Units daily    . ipratropium-albuterol (DUONEB) 0.5-2.5 (3) MG/3ML SOLN Take 3 mLs by nebulization every 6 (six) hours as needed (for shortness of breath).     Marland Kitchen lisinopril (PRINIVIL,ZESTRIL) 2.5 MG tablet Take 2.5 mg by mouth 2 (two) times daily.    Marland Kitchen LORazepam (ATIVAN) 0.5 MG tablet Take 1 tablet (0.5 mg total) by mouth once as needed for anxiety. Take 30 min prior to radiation treatment. 5 tablet 0  . Melatonin 300 MCG TABS Take 1 tablet by mouth at bedtime as needed (for sleep).     . metoprolol  succinate (TOPROL-XL) 100 MG 24 hr tablet Take 100 mg by mouth daily. Take with or immediately following a meal.    . Multiple Vitamin (MULTIVITAMIN) tablet Take 1 tablet by mouth daily.    Marland Kitchen omeprazole (PRILOSEC) 20 MG capsule Take 20 mg by mouth 2 (two) times daily before a meal.    . potassium chloride SA (K-DUR,KLOR-CON) 20 MEQ tablet Take 20 mEq by mouth daily.    . pravastatin (PRAVACHOL) 80 MG tablet Take 80 mg by mouth at bedtime.    Marland Kitchen PRESCRIPTION MEDICATION Take 1 tablet by mouth 2 (two) times daily.    Alveda Reasons 20 MG TABS tablet Take 20 mg by mouth daily.      No current facility-administered medications for this encounter.    Physical Findings: The patient is in no acute distress. Patient is alert and oriented.  height is '5\' 3"'$  (1.6 m) and weight is 218 lb 14.4 oz (99.292 kg). Her oral temperature is 98.5 F (36.9 C). Her blood pressure is 174/68 and her pulse is 54. Her respiration is 22 and oxygen saturation is 94%. .   General: Well-developed, in no acute distress HEENT: Normocephalic, atraumatic Cardiovascular: Regular rate and rhythm  Respiratory: Clear to auscultation bilaterally GI: Soft, nontender, normal bowel sounds Extremities: No edema present  Lab Findings: Lab Results  Component Value Date   WBC 6.9 03/20/2013   HGB 11.6* 03/20/2013   HCT 35.9* 03/20/2013   MCV 88.0 03/20/2013   PLT 130* 03/20/2013     Radiographic Findings: No results found.  Impression: The patient is recovering from the effects of radiation.   Plan:  CT scan in 2 months and follow-up.    ------------------------------------------------  Jodelle Gross, MD, PhD  This document serves as a record of services personally performed by Kyung Rudd, MD. It was created on his behalf by Derek Mound, a trained medical scribe. The creation of this record is based on the scribe's personal observations and the provider's statements to them. This document has been checked and approved by the  attending provider.

## 2014-10-29 ENCOUNTER — Telehealth: Payer: Self-pay | Admitting: *Deleted

## 2014-10-29 ENCOUNTER — Encounter: Payer: Self-pay | Admitting: Radiation Oncology

## 2014-10-29 NOTE — Telephone Encounter (Signed)
Called patient to inform of lab, scan and fu, spoke with patient and she is aware of these appts.

## 2014-10-29 NOTE — Telephone Encounter (Signed)
CALLED PATIENT TO INFORM OF LAB, SCAN AND FU, NO ANSWER WILL CALL LATER

## 2014-10-31 DIAGNOSIS — J449 Chronic obstructive pulmonary disease, unspecified: Secondary | ICD-10-CM | POA: Diagnosis not present

## 2014-11-03 DIAGNOSIS — G4730 Sleep apnea, unspecified: Secondary | ICD-10-CM | POA: Diagnosis not present

## 2014-11-03 DIAGNOSIS — J310 Chronic rhinitis: Secondary | ICD-10-CM | POA: Diagnosis not present

## 2014-11-03 DIAGNOSIS — J449 Chronic obstructive pulmonary disease, unspecified: Secondary | ICD-10-CM | POA: Diagnosis not present

## 2014-11-03 DIAGNOSIS — R5383 Other fatigue: Secondary | ICD-10-CM | POA: Diagnosis not present

## 2014-11-16 DIAGNOSIS — G4733 Obstructive sleep apnea (adult) (pediatric): Secondary | ICD-10-CM | POA: Diagnosis not present

## 2014-11-16 DIAGNOSIS — G4712 Idiopathic hypersomnia without long sleep time: Secondary | ICD-10-CM | POA: Diagnosis not present

## 2014-11-30 DIAGNOSIS — J449 Chronic obstructive pulmonary disease, unspecified: Secondary | ICD-10-CM | POA: Diagnosis not present

## 2014-12-14 DIAGNOSIS — J449 Chronic obstructive pulmonary disease, unspecified: Secondary | ICD-10-CM | POA: Diagnosis not present

## 2014-12-14 DIAGNOSIS — J310 Chronic rhinitis: Secondary | ICD-10-CM | POA: Diagnosis not present

## 2014-12-14 DIAGNOSIS — R5383 Other fatigue: Secondary | ICD-10-CM | POA: Diagnosis not present

## 2014-12-14 DIAGNOSIS — R222 Localized swelling, mass and lump, trunk: Secondary | ICD-10-CM | POA: Diagnosis not present

## 2014-12-14 DIAGNOSIS — G4730 Sleep apnea, unspecified: Secondary | ICD-10-CM | POA: Diagnosis not present

## 2014-12-16 DIAGNOSIS — G4733 Obstructive sleep apnea (adult) (pediatric): Secondary | ICD-10-CM | POA: Diagnosis not present

## 2014-12-28 ENCOUNTER — Telehealth: Payer: Self-pay | Admitting: *Deleted

## 2014-12-28 NOTE — Telephone Encounter (Signed)
Called patient's daughter, Anne Sanders to inform of lab and test being moved to 01-12-15 per patient's daughter request and her follow-up being moved to 01-22-15 @ 4:30 pm with Dr. Lisbeth Renshaw, spoke with patient's daughter, Anne Sanders and she is aware of this and agreeable to these dates and times

## 2014-12-29 ENCOUNTER — Ambulatory Visit (HOSPITAL_COMMUNITY): Payer: Medicare Other

## 2014-12-29 ENCOUNTER — Ambulatory Visit
Admission: RE | Admit: 2014-12-29 | Discharge: 2014-12-29 | Disposition: A | Discharge status: Home / Self Care | Payer: Medicare Other | Source: Ambulatory Visit | Attending: Radiation Oncology | Admitting: Radiation Oncology

## 2014-12-29 ENCOUNTER — Ambulatory Visit: Payer: Medicare Other

## 2014-12-29 DIAGNOSIS — C3411 Malignant neoplasm of upper lobe, right bronchus or lung: Secondary | ICD-10-CM

## 2014-12-30 ENCOUNTER — Ambulatory Visit
Admission: RE | Admit: 2014-12-30 | Discharge: 2014-12-30 | Disposition: A | Discharge status: Home / Self Care | Payer: Medicare Other | Source: Ambulatory Visit | Attending: Radiation Oncology | Admitting: Radiation Oncology

## 2014-12-30 ENCOUNTER — Ambulatory Visit: Admission: RE | Admit: 2014-12-30 | Payer: Medicare Other | Source: Ambulatory Visit | Admitting: Radiation Oncology

## 2014-12-31 DIAGNOSIS — J449 Chronic obstructive pulmonary disease, unspecified: Secondary | ICD-10-CM | POA: Diagnosis not present

## 2015-01-06 DIAGNOSIS — D649 Anemia, unspecified: Secondary | ICD-10-CM | POA: Diagnosis not present

## 2015-01-06 DIAGNOSIS — E785 Hyperlipidemia, unspecified: Secondary | ICD-10-CM | POA: Diagnosis not present

## 2015-01-06 DIAGNOSIS — N189 Chronic kidney disease, unspecified: Secondary | ICD-10-CM | POA: Diagnosis not present

## 2015-01-06 DIAGNOSIS — Z23 Encounter for immunization: Secondary | ICD-10-CM | POA: Diagnosis not present

## 2015-01-06 DIAGNOSIS — E119 Type 2 diabetes mellitus without complications: Secondary | ICD-10-CM | POA: Diagnosis not present

## 2015-01-06 DIAGNOSIS — Z1389 Encounter for screening for other disorder: Secondary | ICD-10-CM | POA: Diagnosis not present

## 2015-01-12 ENCOUNTER — Ambulatory Visit: Admission: RE | Admit: 2015-01-12 | Payer: Medicare Other | Source: Ambulatory Visit

## 2015-01-12 ENCOUNTER — Ambulatory Visit (HOSPITAL_COMMUNITY): Payer: Medicare Other

## 2015-01-13 ENCOUNTER — Telehealth: Payer: Self-pay | Admitting: *Deleted

## 2015-01-13 NOTE — Telephone Encounter (Signed)
CALLED PATIENT TO INFORM OF LAB ON 11/29-16 @ 4 PM @ [facility] AND HER CT ON 01-19-15 @ 5 PM @ [facility] AND HER FU WITH DR. Lisbeth Renshaw ON 01-22-15 @ 4:30 PM TO GET HER RESULTS

## 2015-01-18 DIAGNOSIS — E1165 Type 2 diabetes mellitus with hyperglycemia: Secondary | ICD-10-CM | POA: Diagnosis not present

## 2015-01-18 DIAGNOSIS — Z6837 Body mass index (BMI) 37.0-37.9, adult: Secondary | ICD-10-CM | POA: Diagnosis not present

## 2015-01-19 ENCOUNTER — Ambulatory Visit (HOSPITAL_COMMUNITY): Payer: Medicare Other

## 2015-01-19 ENCOUNTER — Ambulatory Visit
Admission: RE | Admit: 2015-01-19 | Discharge: 2015-01-19 | Disposition: A | Discharge status: Home / Self Care | Payer: Medicare Other | Source: Ambulatory Visit | Attending: Radiation Oncology | Admitting: Radiation Oncology

## 2015-01-19 ENCOUNTER — Other Ambulatory Visit: Payer: Self-pay | Admitting: Oncology

## 2015-01-19 ENCOUNTER — Ambulatory Visit (HOSPITAL_COMMUNITY)
Admission: RE | Admit: 2015-01-19 | Discharge: 2015-01-19 | Disposition: A | Discharge status: Home / Self Care | Payer: Medicare Other | Source: Ambulatory Visit | Attending: Radiation Oncology | Admitting: Radiation Oncology

## 2015-01-19 DIAGNOSIS — C3412 Malignant neoplasm of upper lobe, left bronchus or lung: Secondary | ICD-10-CM

## 2015-01-19 DIAGNOSIS — C3411 Malignant neoplasm of upper lobe, right bronchus or lung: Secondary | ICD-10-CM | POA: Insufficient documentation

## 2015-01-19 DIAGNOSIS — Z0189 Encounter for other specified special examinations: Secondary | ICD-10-CM | POA: Insufficient documentation

## 2015-01-19 DIAGNOSIS — E278 Other specified disorders of adrenal gland: Secondary | ICD-10-CM | POA: Diagnosis not present

## 2015-01-19 DIAGNOSIS — R911 Solitary pulmonary nodule: Secondary | ICD-10-CM | POA: Insufficient documentation

## 2015-01-19 DIAGNOSIS — Z923 Personal history of irradiation: Secondary | ICD-10-CM | POA: Diagnosis not present

## 2015-01-19 DIAGNOSIS — C3490 Malignant neoplasm of unspecified part of unspecified bronchus or lung: Secondary | ICD-10-CM | POA: Diagnosis not present

## 2015-01-19 DIAGNOSIS — R590 Localized enlarged lymph nodes: Secondary | ICD-10-CM | POA: Diagnosis not present

## 2015-01-19 LAB — BUN AND CREATININE (CC13)
BUN: 31.3 mg/dL — ABNORMAL HIGH (ref 7.0–26.0)
Creatinine: 1.6 mg/dL — ABNORMAL HIGH (ref 0.6–1.1)
EGFR: 33 mL/min/{1.73_m2} — ABNORMAL LOW (ref >90)

## 2015-01-19 MED ORDER — IOHEXOL 300 MG/ML  SOLN
75.0000 mL | Freq: Once | INTRAMUSCULAR | Status: AC | PRN
  Administered 2015-01-19: 60 mL via INTRAVENOUS

## 2015-01-22 ENCOUNTER — Encounter: Payer: Self-pay | Admitting: Radiation Oncology

## 2015-01-22 ENCOUNTER — Ambulatory Visit
Admission: RE | Admit: 2015-01-22 | Discharge: 2015-01-22 | Disposition: A | Discharge status: Home / Self Care | Payer: Medicare Other | Source: Ambulatory Visit | Attending: Radiation Oncology | Admitting: Radiation Oncology

## 2015-01-22 VITALS — BP 159/80 | HR 70 | Temp 97.7°F

## 2015-01-22 DIAGNOSIS — C3411 Malignant neoplasm of upper lobe, right bronchus or lung: Secondary | ICD-10-CM

## 2015-01-22 NOTE — Progress Notes (Signed)
Radiation Oncology         [phone] ________________________________  Name: Tanaka Gillen MRN: [id number]  Date: 01/22/2015  DOB: [birth year]  Follow-Up Visit Note   CC: Keily Lepp, [name], FNP  Melrose Nakayama, *  Diagnosis: Non-small cell lung cancer     Interval Since Last Radiation:  4 months. Radiation completed 09/02/14-09/09/14.  Narrative:  The patient returns today for routine follow-up. Ms. Pollet here for reassessment s/p XRT to her her RUL which completed in July of this year. She continues to use O2 at 2 liters per minute. VSS. She denies any changes in status since her last visit. Here for assessent and review of her Chest CT Scan from 01/19/15.   ALLERGIES:  has No Known Allergies.  Meds: Current Outpatient Prescriptions  Medication Sig Dispense Refill  . budesonide (PULMICORT) 0.5 MG/2ML nebulizer solution Take 0.5 mg by nebulization daily as needed (for wheezing).     Marland Kitchen buPROPion (WELLBUTRIN SR) 150 MG 12 hr tablet Take 150 mg by mouth 2 (two) times daily.    . digoxin (LANOXIN) 0.125 MG tablet Take 0.125 mg by mouth 2 (two) times daily.     Marland Kitchen gabapentin (NEURONTIN) 100 MG capsule Take 100 mg by mouth 2 (two) times daily. Antibiotic started unsure name, big white pill for her infectionin her legs, sees MD Friday 11/07/13    . glimepiride (AMARYL) 4 MG tablet Take 4 mg by mouth 2 (two) times daily.    . insulin NPH Human (HUMULIN N,NOVOLIN N) 100 UNIT/ML injection Inject 45 Units into the skin daily. Increased to 90  Units daily    . ipratropium-albuterol (DUONEB) 0.5-2.5 (3) MG/3ML SOLN Take 3 mLs by nebulization every 6 (six) hours as needed (for shortness of breath).     Marland Kitchen lisinopril (PRINIVIL,ZESTRIL) 2.5 MG tablet Take 2.5 mg by mouth 2 (two) times daily.    Marland Kitchen LORazepam (ATIVAN) 0.5 MG tablet Take 1 tablet (0.5 mg total) by mouth once as needed for anxiety. Take 30 min prior to radiation treatment. 5 tablet 0  . Melatonin 300 MCG TABS Take 1 tablet by mouth at  bedtime as needed (for sleep).     . metoprolol succinate (TOPROL-XL) 100 MG 24 hr tablet Take 100 mg by mouth daily. Take with or immediately following a meal.    . Multiple Vitamin (MULTIVITAMIN) tablet Take 1 tablet by mouth daily.    Marland Kitchen omeprazole (PRILOSEC) 20 MG capsule Take 20 mg by mouth 2 (two) times daily before a meal.    . potassium chloride SA (K-DUR,KLOR-CON) 20 MEQ tablet Take 20 mEq by mouth daily.    . pravastatin (PRAVACHOL) 80 MG tablet Take 80 mg by mouth at bedtime.    Marland Kitchen PRESCRIPTION MEDICATION Take 1 tablet by mouth 2 (two) times daily.    . sitaGLIPtin (JANUVIA) 50 MG tablet Take 50 mg by mouth daily.    Alveda Reasons 20 MG TABS tablet Take 20 mg by mouth daily.      No current facility-administered medications for this encounter.    Physical Findings: The patient is in no acute distress. Patient is alert and oriented.  temperature is 97.7 F (36.5 C). Her blood pressure is 159/80 and her pulse is 70. Marland Kitchen   General: Well-developed, in no acute distress HEENT: Normocephalic, atraumatic Cardiovascular: Regular rate and rhythm Respiratory: Clear to auscultation bilaterally GI: Soft, nontender, normal bowel sounds Extremities: No edema present  Lab Findings: Lab Results  Component Value Date   WBC  6.9 03/20/2013   HGB 11.6* 03/20/2013   HCT 35.9* 03/20/2013   MCV 88.0 03/20/2013   PLT 130* 03/20/2013     Radiographic Findings: Ct Chest W Contrast  01/20/2015  CLINICAL DATA:  Restaging lung cancer.  No recent therapy. EXAM: CT CHEST WITH CONTRAST TECHNIQUE: Multidetector CT imaging of the chest was performed during intravenous contrast administration. CONTRAST:  26m OMNIPAQUE IOHEXOL 300 MG/ML  SOLN COMPARISON:  PET-CT 07/14/2014 and chest CT 07/07/2014. FINDINGS: Mediastinum/Nodes: No breast masses, supraclavicular or axillary lymphadenopathy. Small scattered axillary lymph nodes are stable. Stable thyroid nodules. The heart is mildly enlarged but stable. Stable  aortic and branch vessel calcifications including the coronary arteries. No pericardial effusion. A 10.5 mm precarinal lymph node on image number 19 previously measured 9.5 mm. The esophagus is grossly normal. Lungs/Pleura: There are fairly extensive right upper radiation changes surrounding nodular neoplasm identified on prior CT and PET-CT. It is difficult to compare and remeasured the lesion. The left upper lobe pulmonary nodule adjacent to the major fissure on image number 23 has enlarged since the prior CT scan PET-CT. It now measures 12.5 x 10.5 mm and previously measured 8.5 x 7.5 mm on the prior chest CT. Interval increase in left apical density when compared to prior examinations. There is also surrounding ground-glass opacity. This is an indeterminate finding but could represent tumor and should be followed closely. No new pulmonary nodules. Stable calcified granuloma at the left lung base. No pleural effusion or enhancing pleural nodules. Upper abdomen: Stable diffuse fatty infiltration of the liver. No focal hepatic lesions. Stable bilateral adrenal gland nodules consistent with adenomas. Stable atherosclerotic calcifications involving the upper abdominal aorta and branch vessels. Musculoskeletal: No significant osseous abnormalities. No worrisome bone lesions or spinal canal compromise. Suspect remote calcified disc extrusion in the mid thoracic spine. IMPRESSION: 1. Radiation changes surrounding right upper lobe pulmonary neoplasm. Difficult to assess for change in size. 2. Enlarging left upper lobe pulmonary nodule adjacent to the major fissure as described above. This is worrisome for neoplasm. Repeat PET-CT may be helpful for further evaluation if clinically necessary. 3. Increased left apical density with surrounding ground-glass opacity could reflect neoplasm. 4. Slight interval enlargement of the precarinal lymph as discussed above. 5. Stable bilateral adrenal gland nodules. Electronically Signed    By: PMarijo SanesM.D.   On: 01/20/2015 08:28    Impression: The patient is recovering from the effects of radiation.   Plan: She will be scheduled for a PET scan. The lesion of interest seen on CT scan recently is inferior to her prior radiation treatment to the left lung. Changes at the left apex were noted which could reflect tumor growth at this point but this also is in the region of her prior radiation treatment and could also reflect ongoing radiation effect. A PET scan will hopefully help to clarify these issues. She will be scheduled for a follow up appointment after that scan.   ------------------------------------------------  JJodelle Gross MD, PhD  This document serves as a record of services personally performed by JKyung Rudd MD. It was created on his behalf by  ALendon Collar a trained medical scribe. The creation of this record is based on the scribe's personal observations and the provider's statements to them. This document has been checked and approved by the attending provider.

## 2015-01-22 NOTE — Progress Notes (Signed)
Anne Sanders here for reassessment s/p XRT to her her RUL which completed in July of this year.  She continues to use O2 at 2 liters per minute.  VSS.  She denies any changes in status since her last visit.  Here for assessent and review of her Chest CT Scan from 01/19/15.  BP 159/80 mmHg  Pulse 70  Temp(Src) 97.7 F (36.5 C)

## 2015-01-25 ENCOUNTER — Telehealth: Payer: Self-pay | Admitting: *Deleted

## 2015-01-25 NOTE — Telephone Encounter (Signed)
Called patient to inform of Pet Scan on 02-03-15 - arrival time - 8:30 am @ [facility] and her fu with Dr. Lisbeth Renshaw on 02-05-15 @ 2:30 pm, spoke with patient and she is aware of these appts.

## 2015-01-26 ENCOUNTER — Telehealth: Payer: Self-pay | Admitting: *Deleted

## 2015-01-26 NOTE — Telephone Encounter (Signed)
Called patient's daughter, [name] to inform Pet and fu has been moved per patient's request, due to transpo. Issues, spoke with [name] and she is aware of new appt. Dates and times.

## 2015-01-30 DIAGNOSIS — J449 Chronic obstructive pulmonary disease, unspecified: Secondary | ICD-10-CM | POA: Diagnosis not present

## 2015-02-01 DIAGNOSIS — Z6836 Body mass index (BMI) 36.0-36.9, adult: Secondary | ICD-10-CM | POA: Diagnosis not present

## 2015-02-01 DIAGNOSIS — E1165 Type 2 diabetes mellitus with hyperglycemia: Secondary | ICD-10-CM | POA: Diagnosis not present

## 2015-02-03 ENCOUNTER — Encounter (HOSPITAL_COMMUNITY): Payer: Medicare Other

## 2015-02-05 ENCOUNTER — Ambulatory Visit: Payer: Medicare Other | Admitting: Radiation Oncology

## 2015-02-17 DIAGNOSIS — D519 Vitamin B12 deficiency anemia, unspecified: Secondary | ICD-10-CM | POA: Diagnosis not present

## 2015-02-17 DIAGNOSIS — R413 Other amnesia: Secondary | ICD-10-CM | POA: Diagnosis not present

## 2015-02-17 DIAGNOSIS — Z79899 Other long term (current) drug therapy: Secondary | ICD-10-CM | POA: Diagnosis not present

## 2015-02-17 DIAGNOSIS — M79672 Pain in left foot: Secondary | ICD-10-CM | POA: Diagnosis not present

## 2015-02-17 DIAGNOSIS — E1165 Type 2 diabetes mellitus with hyperglycemia: Secondary | ICD-10-CM | POA: Diagnosis not present

## 2015-02-19 DIAGNOSIS — G319 Degenerative disease of nervous system, unspecified: Secondary | ICD-10-CM | POA: Diagnosis not present

## 2015-02-19 DIAGNOSIS — R413 Other amnesia: Secondary | ICD-10-CM | POA: Diagnosis not present

## 2015-02-23 DIAGNOSIS — I10 Essential (primary) hypertension: Secondary | ICD-10-CM | POA: Diagnosis not present

## 2015-02-23 DIAGNOSIS — I83009 Varicose veins of unspecified lower extremity with ulcer of unspecified site: Secondary | ICD-10-CM | POA: Diagnosis not present

## 2015-02-23 DIAGNOSIS — R918 Other nonspecific abnormal finding of lung field: Secondary | ICD-10-CM | POA: Diagnosis not present

## 2015-02-23 DIAGNOSIS — E1165 Type 2 diabetes mellitus with hyperglycemia: Secondary | ICD-10-CM | POA: Diagnosis not present

## 2015-02-23 DIAGNOSIS — J329 Chronic sinusitis, unspecified: Secondary | ICD-10-CM | POA: Diagnosis not present

## 2015-02-25 ENCOUNTER — Other Ambulatory Visit: Payer: Self-pay | Admitting: Radiation Oncology

## 2015-02-25 ENCOUNTER — Encounter (HOSPITAL_COMMUNITY)
Admission: RE | Admit: 2015-02-25 | Discharge: 2015-02-25 | Disposition: A | Discharge status: Home / Self Care | Payer: Medicare Other | Source: Ambulatory Visit | Attending: Radiation Oncology | Admitting: Radiation Oncology

## 2015-02-25 DIAGNOSIS — C3411 Malignant neoplasm of upper lobe, right bronchus or lung: Secondary | ICD-10-CM

## 2015-02-25 DIAGNOSIS — C3412 Malignant neoplasm of upper lobe, left bronchus or lung: Secondary | ICD-10-CM | POA: Diagnosis not present

## 2015-02-25 LAB — GLUCOSE, CAPILLARY: Glucose-Capillary: 161 mg/dL — ABNORMAL HIGH (ref 65–99)

## 2015-02-25 MED ORDER — FLUDEOXYGLUCOSE F - 18 (FDG) INJECTION: 10.9000 | Freq: Once | INTRAVENOUS | Status: DC | PRN

## 2015-02-26 ENCOUNTER — Ambulatory Visit
Admission: RE | Admit: 2015-02-26 | Discharge: 2015-02-26 | Disposition: A | Discharge status: Home / Self Care | Payer: Medicare Other | Source: Ambulatory Visit | Attending: Radiation Oncology | Admitting: Radiation Oncology

## 2015-02-26 ENCOUNTER — Encounter: Payer: Self-pay | Admitting: Radiation Oncology

## 2015-02-26 VITALS — BP 153/67 | HR 54 | Temp 98.0°F | Resp 20 | Ht 63.0 in | Wt 214.2 lb

## 2015-02-26 DIAGNOSIS — C3411 Malignant neoplasm of upper lobe, right bronchus or lung: Secondary | ICD-10-CM

## 2015-02-26 NOTE — Progress Notes (Signed)
Follow up  Rad tx lung completed 09/09/14 patient had Pet scan yesterday, here for results, started on Kerphlex  This past Monday for sinus infection,    coughing greenish phelgm, appetite good, on Oxygen 2 liters n/c, Had MRI  In [place] this past Friday showed sinus infection,no pain,   BP 153/67 mmHg  Pulse 54  Temp(Src) 98 F (36.7 C) (Oral)  Resp 20  Ht '5\' 3"'$  (1.6 m)  Wt 214 lb 3.2 oz (97.16 kg)  BMI 37.95 kg/m2  SpO2 90%  Wt Readings from Last 3 Encounters:  02/26/15 214 lb 3.2 oz (97.16 kg)  10/28/14 218 lb 14.4 oz (99.292 kg)  07/24/14 217 lb 6.4 oz (98.612 kg)   2:38 PM

## 2015-03-02 ENCOUNTER — Encounter: Payer: Self-pay | Admitting: Radiation Oncology

## 2015-03-02 DIAGNOSIS — J449 Chronic obstructive pulmonary disease, unspecified: Secondary | ICD-10-CM | POA: Diagnosis not present

## 2015-03-02 NOTE — Progress Notes (Signed)
Radiation Oncology         [phone]) [phone] ________________________________  Name: Anne Sanders MRN: [id number]  Date: 02/26/2015  DOB: 1945/11/06  Follow-Up Visit Note  CC: Anne Sanders, Anne Rocher, FNP  Anne Sanders, *  Diagnosis:      ICD-9-CM ICD-10-CM   1. Malignant neoplasm of right upper lobe of lung (HCC) 162.3 C34.11      Narrative:  The patient returns today for routine follow-up.   Follow up  Rad tx lung completed 09/09/14 patient had Pet scan yesterday, here for results, started on Kerphlex  This past Monday for sinus infection,    coughing greenish phelgm, appetite good, on Oxygen 2 liters n/c, Had MRI  In [place] this past Friday showed sinus infection,no pain,   BP 153/67 mmHg  Pulse 54  Temp(Src) 98 F (36.7 C) (Oral)  Resp 20  Ht '5\' 3"'$  (1.6 m)  Wt 214 lb 3.2 oz (97.16 kg)  BMI 37.95 kg/m2  SpO2 90%  Wt Readings from Last 3 Encounters:  02/26/15 214 lb 3.2 oz (97.16 kg)  10/28/14 218 lb 14.4 oz (99.292 kg)  07/24/14 217 lb 6.4 oz (98.612 kg)   4:12 AM  The patient recently underwent a PET scan on 02/25/2015 and she comes in today to review the results with me.                              ALLERGIES:  has No Known Allergies.  Meds: Current Outpatient Prescriptions  Medication Sig Dispense Refill  . budesonide (PULMICORT) 0.5 MG/2ML nebulizer solution Take 0.5 mg by nebulization daily as needed (for wheezing).     Marland Kitchen buPROPion (WELLBUTRIN SR) 150 MG 12 hr tablet Take 150 mg by mouth 2 (two) times daily.    . cephALEXin (KEFLEX) 500 MG capsule Take 500 mg by mouth 4 (four) times daily.    . digoxin (LANOXIN) 0.125 MG tablet Take 0.125 mg by mouth 2 (two) times daily.     Marland Kitchen gabapentin (NEURONTIN) 100 MG capsule Take 100 mg by mouth 2 (two) times daily. Antibiotic started unsure name, big white pill for her infectionin her legs, sees MD Friday 11/07/13    . glimepiride (AMARYL) 4 MG tablet Take 4 mg by mouth 2 (two) times daily.    . insulin NPH Human (HUMULIN  N,NOVOLIN N) 100 UNIT/ML injection Inject 45 Units into the skin daily. Increased to 90  Units daily    . ipratropium-albuterol (DUONEB) 0.5-2.5 (3) MG/3ML SOLN Take 3 mLs by nebulization every 6 (six) hours as needed (for shortness of breath).     Marland Kitchen lisinopril (PRINIVIL,ZESTRIL) 2.5 MG tablet Take 2.5 mg by mouth 2 (two) times daily.    . Melatonin 300 MCG TABS Take 1 tablet by mouth at bedtime as needed (for sleep).     . metoprolol succinate (TOPROL-XL) 100 MG 24 hr tablet Take 100 mg by mouth daily. Take with or immediately following a meal.    . Multiple Vitamin (MULTIVITAMIN) tablet Take 1 tablet by mouth daily.    Marland Kitchen omeprazole (PRILOSEC) 20 MG capsule Take 20 mg by mouth 2 (two) times daily before a meal.    . potassium chloride SA (K-DUR,KLOR-CON) 20 MEQ tablet Take 20 mEq by mouth daily.    . pravastatin (PRAVACHOL) 80 MG tablet Take 80 mg by mouth at bedtime.    . sitaGLIPtin (JANUVIA) 50 MG tablet Take 50 mg by mouth daily.    Marland Kitchen  XARELTO 20 MG TABS tablet Take 20 mg by mouth daily.     Marland Kitchen LORazepam (ATIVAN) 0.5 MG tablet Take 1 tablet (0.5 mg total) by mouth once as needed for anxiety. Take 30 min prior to radiation treatment. (Patient not taking: Reported on 02/26/2015) 5 tablet 0  . PRESCRIPTION MEDICATION Take 1 tablet by mouth 2 (two) times daily. Reported on 02/26/2015     No current facility-administered medications for this encounter.   Facility-Administered Medications Ordered in Other Encounters  Medication Dose Route Frequency Provider Last Rate Last Dose  . fludeoxyglucose F - 18 (FDG) injection 10.9 milli Curie  10.9 milli Curie Intravenous Once PRN Kyung Rudd, MD        Physical Findings: The patient is in no acute distress. Patient is alert and oriented.  height is '5\' 3"'$  (1.6 m) and weight is 214 lb 3.2 oz (97.16 kg). Her oral temperature is 98 F (36.7 C). Her blood pressure is 153/67 and her pulse is 54. Her respiration is 20 and oxygen saturation is 90%. .     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 EXAM: NUCLEAR MEDICINE PET SKULL BASE TO THIGH TECHNIQUE: 11.0 mCi F-18 FDG was injected intravenously. Full-ring PET imaging was performed from the skull base to thigh after the radiotracer. CT data was obtained and used for attenuation correction and anatomic localization. FASTING BLOOD GLUCOSE:  Value: 161 mg/dl COMPARISON:  01/19/2015 chest CT.  07/14/2014 PET-CT. FINDINGS: NECK No hypermetabolic lymph nodes in the neck. Stable mild multinodular goiter with a dominant 1.4 cm lower left thyroid lobe hypodense nodule, with no hypermetabolic thyroid nodules. Complete opacification of the bilateral maxillary sinuses with thickening of the walls of the bilateral maxillary sinuses, in keeping with chronic sinusitis, unchanged. CHEST There is sharply marginated patchy consolidation and ground-glass opacity in the medial basilar right upper lobe surrounding the site of the treated right upper lobe pulmonary nodule, with associated low level metabolism (max SUV 4.1), most in keeping with evolving postradiation change. No focal measurable residual nodule or focal hypermetabolism in the right upper lobe to suggest local tumor recurrence. There is sharply marginated bandlike patchy consolidation and ground-glass opacity in the apical left upper lobe at the site of the previously treated  left upper lobe pulmonary nodule, with associated volume loss and low level metabolism (max SUV 3.8), most in keeping with continued evolution of radiation fibrosis. No focal measurable nodule or focal hypermetabolism in the apical left upper lobe to suggest local tumor recurrence. There is a subsolid 1.1 x 0.9 cm pulmonary nodule in the posterior left upper lobe (series 8/image 29) with low level metabolism (max SUV 2.6), which measured 1.0 x 0.9 cm on 01/19/2015, 1.0 x 0.9 cm on 07/14/2014 and 0.7 x 0.6 cm on 10/23/2012, in keeping with a slowly growing subsolid pulmonary nodule. There is a stable coarsely calcified granuloma in the basilar left lower lobe.  Otherwise no acute consolidative airspace disease or new significant pulmonary nodules. No hypermetabolic axillary, mediastinal or hilar nodes. Stable mild cardiomegaly. Coronary atherosclerosis. Stable dilated main pulmonary artery (3.3 cm diameter). ABDOMEN/PELVIS No abnormal hypermetabolic activity within the liver, pancreas, adrenal glands, or spleen. No hypermetabolic lymph nodes in the abdomen or pelvis. Moderate diffuse hepatic steatosis. Cholelithiasis. Stable 1.9 cm and 2.0 cm bilateral adrenal adenomas. Hysterectomy. Stable large fat containing supraumbilical midline ventral abdominal hernia. SKELETON No focal hypermetabolic activity to suggest skeletal metastasis. IMPRESSION: 1. PET-CT findings favor evolving postradiation change at the site of the treated right upper lobe pulmonary nodule, with no evidence of local tumor recurrence in the right upper lobe. 2. PET-CT findings favor evolving radiation fibrosis at the site of the treated apical left upper lobe pulmonary nodule, with no evidence of local tumor recurrence in the apical left upper lobe. 3. 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 4. No hypermetabolic thoracic lymphadenopathy. No hypermetabolic distant metastatic disease. 5. Numerous stable chronic findings including chronic bilateral maxillary sinusitis, mild multinodular goiter without hypermetabolic thyroid nodules, mild cardiomegaly with coronary atherosclerosis, dilated main pulmonary artery suggesting chronic pulmonary arterial hypertension, moderate diffuse hepatic steatosis, cholelithiasis, bilateral adrenal adenomas and large supraumbilical fat containing ventral abdominal hernia. Electronically Signed   By: Ilona Sorrel M.D.   On: 02/25/2015 13:12    Impression:    The patient's PET scan was reviewed with her today. PET scan findings at the previous sites of radiation treatment suggested post radiation change without any clear evidence of progression within the right upper lobe or the left upper lobe. A separate 1.1 cm pulmonary nodule in the posterior left upper lobe is associated with low level metabolism. In reviewing this with previous scans, this appears to have slowly grown since 2014 consistent with a lung adenocarcinoma. No hypermetabolic thoracic lymphadenopathy or distant disease was seen.  Plan:  I discussed this finding with the patient. We discussed possible options including further workup. At the end of this conversation, the patient indicated that she preferred to proceed with a course of stereotactic body radiation treatment to the separate area within the left upper lobe posteriorly. I would anticipate a potential 3 fraction course of stereotactic body radiation treatment to a dose of 54 gray to this lesion. The patient will proceed with a simulation within the next couple of weeks.   Jodelle Gross, M.D., Ph.D.

## 2015-03-12 ENCOUNTER — Ambulatory Visit: Admission: RE | Admit: 2015-03-12 | Payer: Medicare Other | Source: Ambulatory Visit | Admitting: Radiation Oncology

## 2015-03-12 ENCOUNTER — Ambulatory Visit
Admission: RE | Admit: 2015-03-12 | Discharge: 2015-03-12 | Disposition: A | Discharge status: Home / Self Care | Payer: Medicare Other | Source: Ambulatory Visit | Attending: Radiation Oncology | Admitting: Radiation Oncology

## 2015-03-12 DIAGNOSIS — Z510 Encounter for antineoplastic radiation therapy: Secondary | ICD-10-CM | POA: Diagnosis not present

## 2015-03-12 DIAGNOSIS — C3412 Malignant neoplasm of upper lobe, left bronchus or lung: Secondary | ICD-10-CM

## 2015-03-12 DIAGNOSIS — C3411 Malignant neoplasm of upper lobe, right bronchus or lung: Secondary | ICD-10-CM | POA: Insufficient documentation

## 2015-03-17 DIAGNOSIS — Z510 Encounter for antineoplastic radiation therapy: Secondary | ICD-10-CM | POA: Diagnosis not present

## 2015-03-17 DIAGNOSIS — C3411 Malignant neoplasm of upper lobe, right bronchus or lung: Secondary | ICD-10-CM | POA: Diagnosis not present

## 2015-03-22 ENCOUNTER — Ambulatory Visit
Admission: RE | Admit: 2015-03-22 | Discharge: 2015-03-22 | Disposition: A | Discharge status: Home / Self Care | Payer: Medicare Other | Source: Ambulatory Visit | Attending: Radiation Oncology | Admitting: Radiation Oncology

## 2015-03-22 DIAGNOSIS — Z510 Encounter for antineoplastic radiation therapy: Secondary | ICD-10-CM | POA: Diagnosis not present

## 2015-03-22 DIAGNOSIS — C3411 Malignant neoplasm of upper lobe, right bronchus or lung: Secondary | ICD-10-CM | POA: Diagnosis not present

## 2015-03-25 ENCOUNTER — Ambulatory Visit
Admission: RE | Admit: 2015-03-25 | Discharge: 2015-03-25 | Disposition: A | Discharge status: Home / Self Care | Payer: Medicare Other | Source: Ambulatory Visit | Attending: Radiation Oncology | Admitting: Radiation Oncology

## 2015-03-25 DIAGNOSIS — C3411 Malignant neoplasm of upper lobe, right bronchus or lung: Secondary | ICD-10-CM | POA: Diagnosis not present

## 2015-03-25 DIAGNOSIS — Z510 Encounter for antineoplastic radiation therapy: Secondary | ICD-10-CM | POA: Diagnosis not present

## 2015-03-29 ENCOUNTER — Encounter: Payer: Self-pay | Admitting: Radiation Oncology

## 2015-03-29 ENCOUNTER — Ambulatory Visit
Admission: RE | Admit: 2015-03-29 | Discharge: 2015-03-29 | Disposition: A | Discharge status: Home / Self Care | Payer: Medicare Other | Source: Ambulatory Visit | Attending: Radiation Oncology | Admitting: Radiation Oncology

## 2015-03-29 VITALS — BP 148/96 | HR 61 | Temp 97.5°F | Resp 22 | Wt 207.1 lb

## 2015-03-29 DIAGNOSIS — C3411 Malignant neoplasm of upper lobe, right bronchus or lung: Secondary | ICD-10-CM

## 2015-03-29 DIAGNOSIS — Z510 Encounter for antineoplastic radiation therapy: Secondary | ICD-10-CM | POA: Diagnosis not present

## 2015-03-29 NOTE — Progress Notes (Signed)
SBRT LL lung #3/3, completed, patient with non productive cough, no nausea no pain, wearing oxygen 2 liters n/c, sob, 95% sats,  1 month f/u appt card given, appetite okay, fatigued 1:47 PM BP 148/96 mmHg  Pulse 61  Temp(Src) 97.5 F (36.4 C) (Oral)  Resp 22  Ht '5\' 3"'$  (1.6 m)  Wt 207 lb 1.6 oz (93.94 kg)  BMI 36.70 kg/m2  SpO2 95%  Wt Readings from Last 3 Encounters:  03/29/15 207 lb 1.6 oz (93.94 kg)  02/26/15 214 lb 3.2 oz (97.16 kg)  10/28/14 218 lb 14.4 oz (99.292 kg)

## 2015-03-29 NOTE — Progress Notes (Signed)
Department of Radiation Oncology  Phone:  [phone] Fax:        [phone]  Weekly Treatment Note    Name: Anne Sanders Date: 03/29/2015 MRN: [id number] DOB: Apr 30, 1945   Diagnosis:     ICD-9-CM ICD-10-CM   1. Malignant neoplasm of right upper lobe of lung ([facility]) 162.3 C34.11      Current dose: 54 Gy  Current fraction: 3   MEDICATIONS: Current Outpatient Prescriptions  Medication Sig Dispense Refill  . budesonide (PULMICORT) 0.5 MG/2ML nebulizer solution Take 0.5 mg by nebulization daily as needed (for wheezing).     Marland Kitchen buPROPion (WELLBUTRIN SR) 150 MG 12 hr tablet Take 150 mg by mouth 2 (two) times daily.    . digoxin (LANOXIN) 0.125 MG tablet Take 0.125 mg by mouth 2 (two) times daily.     Marland Kitchen gabapentin (NEURONTIN) 100 MG capsule Take 100 mg by mouth 2 (two) times daily. Antibiotic started unsure name, big white pill for her infectionin her legs, sees MD Friday 11/07/13    . glimepiride (AMARYL) 4 MG tablet Take 4 mg by mouth 2 (two) times daily.    . insulin NPH Human (HUMULIN N,NOVOLIN N) 100 UNIT/ML injection Inject 45 Units into the skin daily. Increased to 90  Units daily    . ipratropium-albuterol (DUONEB) 0.5-2.5 (3) MG/3ML SOLN Take 3 mLs by nebulization every 6 (six) hours as needed (for shortness of breath).     Marland Kitchen lisinopril (PRINIVIL,ZESTRIL) 2.5 MG tablet Take 2.5 mg by mouth 2 (two) times daily.    Marland Kitchen LORazepam (ATIVAN) 0.5 MG tablet Take 1 tablet (0.5 mg total) by mouth once as needed for anxiety. Take 30 min prior to radiation treatment. (Patient not taking: Reported on 02/26/2015) 5 tablet 0  . Melatonin 300 MCG TABS Take 1 tablet by mouth at bedtime as needed (for sleep).     . metoprolol succinate (TOPROL-XL) 100 MG 24 hr tablet Take 100 mg by mouth daily. Take with or immediately following a meal.    . Multiple Vitamin (MULTIVITAMIN) tablet Take 1 tablet by mouth daily.    Marland Kitchen omeprazole (PRILOSEC) 20 MG capsule Take 20 mg by mouth 2 (two) times daily before a  meal.    . potassium chloride SA (K-DUR,KLOR-CON) 20 MEQ tablet Take 20 mEq by mouth daily.    . pravastatin (PRAVACHOL) 80 MG tablet Take 80 mg by mouth at bedtime.    Marland Kitchen PRESCRIPTION MEDICATION Take 1 tablet by mouth 2 (two) times daily. Reported on 03/29/2015    . sitaGLIPtin (JANUVIA) 50 MG tablet Take 50 mg by mouth daily.    Alveda Reasons 20 MG TABS tablet Take 20 mg by mouth daily.      No current facility-administered medications for this encounter.     ALLERGIES: Review of patient's allergies indicates no known allergies.   LABORATORY DATA:  Lab Results  Component Value Date   WBC 6.9 03/20/2013   HGB 11.6* 03/20/2013   HCT 35.9* 03/20/2013   MCV 88.0 03/20/2013   PLT 130* 03/20/2013   Lab Results  Component Value Date   NA 138 03/20/2013   K 4.7 03/20/2013   CL 96 03/20/2013   CO2 29 03/20/2013   Lab Results  Component Value Date   ALT 19 03/20/2013   AST 22 03/20/2013   ALKPHOS 66 03/20/2013   BILITOT 0.3 03/20/2013     NARRATIVE: Anne Sanders was seen today for weekly treatment management. The chart was checked and the patient's films were  reviewed.  SBRT LL lung #3/3, completed, patient with non productive cough, no nausea no pain, wearing oxygen 2 liters n/c, sob, 95% sats,  1 month f/u appt card given, appetite okay, fatigued 1:56 PM BP 148/96 mmHg  Pulse 61  Temp(Src) 97.5 F (36.4 C) (Oral)  Resp 22  Wt 207 lb 1.6 oz (93.94 kg)  SpO2 95%  Wt Readings from Last 3 Encounters:  03/29/15 207 lb 1.6 oz (93.94 kg)  03/29/15 207 lb 1.6 oz (93.94 kg)  02/26/15 214 lb 3.2 oz (97.16 kg)    PHYSICAL EXAMINATION: weight is 207 lb 1.6 oz (93.94 kg). Her oral temperature is 97.5 F (36.4 C). Her blood pressure is 148/96 and her pulse is 61. Her respiration is 22 and oxygen saturation is 95%.        ASSESSMENT: The patient is doing satisfactorily with treatment.  PLAN: We will continue with the patient's radiation treatment as planned. The patient will  follow-up in one month.

## 2015-04-02 DIAGNOSIS — J449 Chronic obstructive pulmonary disease, unspecified: Secondary | ICD-10-CM | POA: Diagnosis not present

## 2015-04-08 DIAGNOSIS — E785 Hyperlipidemia, unspecified: Secondary | ICD-10-CM | POA: Diagnosis not present

## 2015-04-08 DIAGNOSIS — J449 Chronic obstructive pulmonary disease, unspecified: Secondary | ICD-10-CM | POA: Diagnosis not present

## 2015-04-08 DIAGNOSIS — M7989 Other specified soft tissue disorders: Secondary | ICD-10-CM | POA: Diagnosis not present

## 2015-04-08 DIAGNOSIS — I10 Essential (primary) hypertension: Secondary | ICD-10-CM | POA: Diagnosis not present

## 2015-04-08 DIAGNOSIS — D649 Anemia, unspecified: Secondary | ICD-10-CM | POA: Diagnosis not present

## 2015-04-08 DIAGNOSIS — C3490 Malignant neoplasm of unspecified part of unspecified bronchus or lung: Secondary | ICD-10-CM | POA: Diagnosis not present

## 2015-04-08 DIAGNOSIS — E1165 Type 2 diabetes mellitus with hyperglycemia: Secondary | ICD-10-CM | POA: Diagnosis not present

## 2015-04-12 DIAGNOSIS — E611 Iron deficiency: Secondary | ICD-10-CM | POA: Diagnosis not present

## 2015-04-12 DIAGNOSIS — L02419 Cutaneous abscess of limb, unspecified: Secondary | ICD-10-CM | POA: Diagnosis not present

## 2015-04-12 DIAGNOSIS — E538 Deficiency of other specified B group vitamins: Secondary | ICD-10-CM | POA: Diagnosis not present

## 2015-04-12 DIAGNOSIS — M7989 Other specified soft tissue disorders: Secondary | ICD-10-CM | POA: Diagnosis not present

## 2015-04-14 NOTE — Progress Notes (Signed)
[facility] Simulation and Treatment Planning Note   Name:  '@PATNAME'$ @ MRN: [id number]   Date: 04/14/2015  DOB: 01-Jun-1945  Status:outpatient    DIAGNOSIS: '@CURRDX'$ @   CONSENT VERIFIED:yes   SET UP: Patient is setup supine   IMMOBILIZATION: The patient was immobilized using a Vac Loc bag and Abdominal Compression.   NARRATIVE:The patient was brought to the [facility].  Identity was confirmed.  All relevant records and images related to the planned course of therapy were reviewed.  Then, the patient was positioned in a stable reproducible clinical set-up for radiation therapy. Abdominal compression was applied by me.  4D CT images were obtained and reproducible breathing pattern was confirmed. Free breathing CT images were obtained.  Skin markings were placed.  The CT images were loaded into the planning software where the target and avoidance structures were contoured.  The radiation prescription was entered and confirmed.    TREATMENT PLANNING NOTE:  Treatment planning then occurred. I have requested : MLC's, isodose plan, basic dose calculation.  A total of 4 customized treatment fields have been designed for the patient's treatment.  3 dimensional simulation is performed and dose volume histogram of the gross tumor volume, planning tumor volume and criticial normal structures including the spinal cord and lungs were analyzed and requested.    Special treatment procedure was performed due to high dose per fraction.  The patient will be monitored for increased risk of toxicity.  Daily imaging using cone beam CT will be used for target localization.  ------------------------------------------------  Jodelle Gross, MD, PhD

## 2015-04-14 NOTE — Addendum Note (Signed)
Encounter addended by: Kyung Rudd, MD on: 04/14/2015  9:26 PM<BR>     Documentation filed: Notes Section, Visit Diagnoses

## 2015-04-14 NOTE — Progress Notes (Signed)
  Radiation Oncology         (336) [phone] ________________________________  Name: Anne Sanders MRN: [id number]  Date: 03/29/2015  DOB: 1945-09-22  End of Treatment Note  Diagnosis:   Non-small cell lung cancer     Indication for treatment:  Curative       Radiation treatment dates:   03/21/2014 through 03/29/2015  Site/dose:   The tumor in the left upper lobe was treated with a course of stereotactic body radiation treatment. The patient received 54 Gy In 3 fractions at 18 G per fraction.  Narrative: The patient tolerated radiation treatment relatively well.   The patient did not have any signs of acute toxicity during treatment.  Plan: The patient has completed radiation treatment. The patient will return to radiation oncology clinic for routine followup in one month. I advised the patient to call or return sooner if they have any questions or concerns related to their recovery or treatment.   ------------------------------------------------  Jodelle Gross, MD, PhD

## 2015-04-19 DIAGNOSIS — G4733 Obstructive sleep apnea (adult) (pediatric): Secondary | ICD-10-CM | POA: Diagnosis not present

## 2015-04-19 DIAGNOSIS — I5033 Acute on chronic diastolic (congestive) heart failure: Secondary | ICD-10-CM | POA: Diagnosis not present

## 2015-04-19 DIAGNOSIS — I82409 Acute embolism and thrombosis of unspecified deep veins of unspecified lower extremity: Secondary | ICD-10-CM | POA: Diagnosis not present

## 2015-04-19 DIAGNOSIS — Z85118 Personal history of other malignant neoplasm of bronchus and lung: Secondary | ICD-10-CM | POA: Diagnosis not present

## 2015-04-19 DIAGNOSIS — J9620 Acute and chronic respiratory failure, unspecified whether with hypoxia or hypercapnia: Secondary | ICD-10-CM | POA: Diagnosis not present

## 2015-04-19 DIAGNOSIS — E611 Iron deficiency: Secondary | ICD-10-CM | POA: Diagnosis not present

## 2015-04-19 DIAGNOSIS — Z79899 Other long term (current) drug therapy: Secondary | ICD-10-CM | POA: Diagnosis not present

## 2015-04-19 DIAGNOSIS — J441 Chronic obstructive pulmonary disease with (acute) exacerbation: Secondary | ICD-10-CM | POA: Diagnosis not present

## 2015-04-19 DIAGNOSIS — R911 Solitary pulmonary nodule: Secondary | ICD-10-CM | POA: Diagnosis not present

## 2015-04-19 DIAGNOSIS — L02419 Cutaneous abscess of limb, unspecified: Secondary | ICD-10-CM | POA: Diagnosis not present

## 2015-04-19 DIAGNOSIS — R0902 Hypoxemia: Secondary | ICD-10-CM | POA: Diagnosis not present

## 2015-04-19 DIAGNOSIS — I509 Heart failure, unspecified: Secondary | ICD-10-CM | POA: Diagnosis not present

## 2015-04-19 DIAGNOSIS — N3000 Acute cystitis without hematuria: Secondary | ICD-10-CM | POA: Diagnosis not present

## 2015-04-19 DIAGNOSIS — D649 Anemia, unspecified: Secondary | ICD-10-CM | POA: Diagnosis not present

## 2015-04-19 DIAGNOSIS — I110 Hypertensive heart disease with heart failure: Secondary | ICD-10-CM | POA: Diagnosis not present

## 2015-04-19 DIAGNOSIS — J9611 Chronic respiratory failure with hypoxia: Secondary | ICD-10-CM | POA: Diagnosis not present

## 2015-04-19 DIAGNOSIS — R509 Fever, unspecified: Secondary | ICD-10-CM | POA: Diagnosis not present

## 2015-04-19 DIAGNOSIS — E1142 Type 2 diabetes mellitus with diabetic polyneuropathy: Secondary | ICD-10-CM | POA: Diagnosis not present

## 2015-04-19 DIAGNOSIS — Z87891 Personal history of nicotine dependence: Secondary | ICD-10-CM | POA: Diagnosis not present

## 2015-04-19 DIAGNOSIS — T380X5A Adverse effect of glucocorticoids and synthetic analogues, initial encounter: Secondary | ICD-10-CM | POA: Diagnosis not present

## 2015-04-19 DIAGNOSIS — Z794 Long term (current) use of insulin: Secondary | ICD-10-CM | POA: Diagnosis not present

## 2015-04-19 DIAGNOSIS — R8290 Unspecified abnormal findings in urine: Secondary | ICD-10-CM | POA: Diagnosis not present

## 2015-04-19 DIAGNOSIS — R05 Cough: Secondary | ICD-10-CM | POA: Diagnosis not present

## 2015-04-19 DIAGNOSIS — B3749 Other urogenital candidiasis: Secondary | ICD-10-CM | POA: Diagnosis not present

## 2015-04-19 DIAGNOSIS — J189 Pneumonia, unspecified organism: Secondary | ICD-10-CM | POA: Diagnosis not present

## 2015-04-19 DIAGNOSIS — I272 Other secondary pulmonary hypertension: Secondary | ICD-10-CM | POA: Diagnosis not present

## 2015-04-19 DIAGNOSIS — I482 Chronic atrial fibrillation: Secondary | ICD-10-CM | POA: Diagnosis not present

## 2015-04-19 DIAGNOSIS — J90 Pleural effusion, not elsewhere classified: Secondary | ICD-10-CM | POA: Diagnosis not present

## 2015-04-19 DIAGNOSIS — Z86711 Personal history of pulmonary embolism: Secondary | ICD-10-CM | POA: Diagnosis not present

## 2015-04-19 DIAGNOSIS — Z7901 Long term (current) use of anticoagulants: Secondary | ICD-10-CM | POA: Diagnosis not present

## 2015-04-19 DIAGNOSIS — J9690 Respiratory failure, unspecified, unspecified whether with hypoxia or hypercapnia: Secondary | ICD-10-CM | POA: Diagnosis not present

## 2015-04-19 DIAGNOSIS — J45909 Unspecified asthma, uncomplicated: Secondary | ICD-10-CM | POA: Diagnosis not present

## 2015-04-19 DIAGNOSIS — I4891 Unspecified atrial fibrillation: Secondary | ICD-10-CM | POA: Diagnosis not present

## 2015-04-19 DIAGNOSIS — I160 Hypertensive urgency: Secondary | ICD-10-CM | POA: Diagnosis not present

## 2015-04-19 DIAGNOSIS — E1165 Type 2 diabetes mellitus with hyperglycemia: Secondary | ICD-10-CM | POA: Diagnosis not present

## 2015-04-19 DIAGNOSIS — K219 Gastro-esophageal reflux disease without esophagitis: Secondary | ICD-10-CM | POA: Diagnosis not present

## 2015-04-19 DIAGNOSIS — J9811 Atelectasis: Secondary | ICD-10-CM | POA: Diagnosis not present

## 2015-04-19 DIAGNOSIS — R0602 Shortness of breath: Secondary | ICD-10-CM | POA: Diagnosis not present

## 2015-04-25 DIAGNOSIS — J9611 Chronic respiratory failure with hypoxia: Secondary | ICD-10-CM | POA: Diagnosis not present

## 2015-04-25 DIAGNOSIS — J440 Chronic obstructive pulmonary disease with acute lower respiratory infection: Secondary | ICD-10-CM | POA: Diagnosis not present

## 2015-04-25 DIAGNOSIS — Z9981 Dependence on supplemental oxygen: Secondary | ICD-10-CM | POA: Diagnosis not present

## 2015-04-25 DIAGNOSIS — I5033 Acute on chronic diastolic (congestive) heart failure: Secondary | ICD-10-CM | POA: Diagnosis not present

## 2015-04-25 DIAGNOSIS — J189 Pneumonia, unspecified organism: Secondary | ICD-10-CM | POA: Diagnosis not present

## 2015-04-25 DIAGNOSIS — I110 Hypertensive heart disease with heart failure: Secondary | ICD-10-CM | POA: Diagnosis not present

## 2015-04-25 DIAGNOSIS — I270 Primary pulmonary hypertension: Secondary | ICD-10-CM | POA: Diagnosis not present

## 2015-04-25 DIAGNOSIS — G4733 Obstructive sleep apnea (adult) (pediatric): Secondary | ICD-10-CM | POA: Diagnosis not present

## 2015-04-25 DIAGNOSIS — C3490 Malignant neoplasm of unspecified part of unspecified bronchus or lung: Secondary | ICD-10-CM | POA: Diagnosis not present

## 2015-04-25 DIAGNOSIS — I482 Chronic atrial fibrillation: Secondary | ICD-10-CM | POA: Diagnosis not present

## 2015-04-25 DIAGNOSIS — Z7901 Long term (current) use of anticoagulants: Secondary | ICD-10-CM | POA: Diagnosis not present

## 2015-04-25 DIAGNOSIS — J441 Chronic obstructive pulmonary disease with (acute) exacerbation: Secondary | ICD-10-CM | POA: Diagnosis not present

## 2015-04-27 DIAGNOSIS — Z9981 Dependence on supplemental oxygen: Secondary | ICD-10-CM | POA: Diagnosis not present

## 2015-04-27 DIAGNOSIS — C3490 Malignant neoplasm of unspecified part of unspecified bronchus or lung: Secondary | ICD-10-CM | POA: Diagnosis not present

## 2015-04-27 DIAGNOSIS — I270 Primary pulmonary hypertension: Secondary | ICD-10-CM | POA: Diagnosis not present

## 2015-04-27 DIAGNOSIS — I5033 Acute on chronic diastolic (congestive) heart failure: Secondary | ICD-10-CM | POA: Diagnosis not present

## 2015-04-27 DIAGNOSIS — J440 Chronic obstructive pulmonary disease with acute lower respiratory infection: Secondary | ICD-10-CM | POA: Diagnosis not present

## 2015-04-27 DIAGNOSIS — I482 Chronic atrial fibrillation: Secondary | ICD-10-CM | POA: Diagnosis not present

## 2015-04-27 DIAGNOSIS — J9611 Chronic respiratory failure with hypoxia: Secondary | ICD-10-CM | POA: Diagnosis not present

## 2015-04-27 DIAGNOSIS — J189 Pneumonia, unspecified organism: Secondary | ICD-10-CM | POA: Diagnosis not present

## 2015-04-27 DIAGNOSIS — J441 Chronic obstructive pulmonary disease with (acute) exacerbation: Secondary | ICD-10-CM | POA: Diagnosis not present

## 2015-04-27 DIAGNOSIS — I110 Hypertensive heart disease with heart failure: Secondary | ICD-10-CM | POA: Diagnosis not present

## 2015-04-27 DIAGNOSIS — G4733 Obstructive sleep apnea (adult) (pediatric): Secondary | ICD-10-CM | POA: Diagnosis not present

## 2015-04-27 DIAGNOSIS — Z7901 Long term (current) use of anticoagulants: Secondary | ICD-10-CM | POA: Diagnosis not present

## 2015-04-30 DIAGNOSIS — J449 Chronic obstructive pulmonary disease, unspecified: Secondary | ICD-10-CM | POA: Diagnosis not present

## 2015-04-30 DIAGNOSIS — K210 Gastro-esophageal reflux disease with esophagitis: Secondary | ICD-10-CM | POA: Diagnosis not present

## 2015-04-30 DIAGNOSIS — Z7901 Long term (current) use of anticoagulants: Secondary | ICD-10-CM | POA: Diagnosis not present

## 2015-04-30 DIAGNOSIS — G4733 Obstructive sleep apnea (adult) (pediatric): Secondary | ICD-10-CM | POA: Diagnosis not present

## 2015-04-30 DIAGNOSIS — C3490 Malignant neoplasm of unspecified part of unspecified bronchus or lung: Secondary | ICD-10-CM | POA: Diagnosis not present

## 2015-04-30 DIAGNOSIS — Z9981 Dependence on supplemental oxygen: Secondary | ICD-10-CM | POA: Diagnosis not present

## 2015-04-30 DIAGNOSIS — I5033 Acute on chronic diastolic (congestive) heart failure: Secondary | ICD-10-CM | POA: Diagnosis not present

## 2015-04-30 DIAGNOSIS — I82409 Acute embolism and thrombosis of unspecified deep veins of unspecified lower extremity: Secondary | ICD-10-CM | POA: Diagnosis not present

## 2015-04-30 DIAGNOSIS — J189 Pneumonia, unspecified organism: Secondary | ICD-10-CM | POA: Diagnosis not present

## 2015-04-30 DIAGNOSIS — J9611 Chronic respiratory failure with hypoxia: Secondary | ICD-10-CM | POA: Diagnosis not present

## 2015-04-30 DIAGNOSIS — E119 Type 2 diabetes mellitus without complications: Secondary | ICD-10-CM | POA: Diagnosis not present

## 2015-04-30 DIAGNOSIS — I270 Primary pulmonary hypertension: Secondary | ICD-10-CM | POA: Diagnosis not present

## 2015-04-30 DIAGNOSIS — I482 Chronic atrial fibrillation: Secondary | ICD-10-CM | POA: Diagnosis not present

## 2015-04-30 DIAGNOSIS — J441 Chronic obstructive pulmonary disease with (acute) exacerbation: Secondary | ICD-10-CM | POA: Diagnosis not present

## 2015-04-30 DIAGNOSIS — I4891 Unspecified atrial fibrillation: Secondary | ICD-10-CM | POA: Diagnosis not present

## 2015-04-30 DIAGNOSIS — I110 Hypertensive heart disease with heart failure: Secondary | ICD-10-CM | POA: Diagnosis not present

## 2015-04-30 DIAGNOSIS — J440 Chronic obstructive pulmonary disease with acute lower respiratory infection: Secondary | ICD-10-CM | POA: Diagnosis not present

## 2015-05-04 DIAGNOSIS — J9611 Chronic respiratory failure with hypoxia: Secondary | ICD-10-CM | POA: Diagnosis not present

## 2015-05-04 DIAGNOSIS — I5033 Acute on chronic diastolic (congestive) heart failure: Secondary | ICD-10-CM | POA: Diagnosis not present

## 2015-05-04 DIAGNOSIS — Z7901 Long term (current) use of anticoagulants: Secondary | ICD-10-CM | POA: Diagnosis not present

## 2015-05-04 DIAGNOSIS — I270 Primary pulmonary hypertension: Secondary | ICD-10-CM | POA: Diagnosis not present

## 2015-05-04 DIAGNOSIS — J189 Pneumonia, unspecified organism: Secondary | ICD-10-CM | POA: Diagnosis not present

## 2015-05-04 DIAGNOSIS — J440 Chronic obstructive pulmonary disease with acute lower respiratory infection: Secondary | ICD-10-CM | POA: Diagnosis not present

## 2015-05-04 DIAGNOSIS — C3490 Malignant neoplasm of unspecified part of unspecified bronchus or lung: Secondary | ICD-10-CM | POA: Diagnosis not present

## 2015-05-04 DIAGNOSIS — I110 Hypertensive heart disease with heart failure: Secondary | ICD-10-CM | POA: Diagnosis not present

## 2015-05-04 DIAGNOSIS — J441 Chronic obstructive pulmonary disease with (acute) exacerbation: Secondary | ICD-10-CM | POA: Diagnosis not present

## 2015-05-04 DIAGNOSIS — I482 Chronic atrial fibrillation: Secondary | ICD-10-CM | POA: Diagnosis not present

## 2015-05-04 DIAGNOSIS — Z9981 Dependence on supplemental oxygen: Secondary | ICD-10-CM | POA: Diagnosis not present

## 2015-05-04 DIAGNOSIS — G4733 Obstructive sleep apnea (adult) (pediatric): Secondary | ICD-10-CM | POA: Diagnosis not present

## 2015-05-05 DIAGNOSIS — E1165 Type 2 diabetes mellitus with hyperglycemia: Secondary | ICD-10-CM | POA: Diagnosis not present

## 2015-05-05 DIAGNOSIS — E872 Acidosis: Secondary | ICD-10-CM | POA: Diagnosis not present

## 2015-05-05 DIAGNOSIS — R300 Dysuria: Secondary | ICD-10-CM | POA: Diagnosis not present

## 2015-05-05 DIAGNOSIS — R410 Disorientation, unspecified: Secondary | ICD-10-CM | POA: Diagnosis not present

## 2015-05-06 ENCOUNTER — Encounter: Payer: Self-pay | Admitting: Radiation Oncology

## 2015-05-06 ENCOUNTER — Ambulatory Visit
Admission: RE | Admit: 2015-05-06 | Discharge: 2015-05-06 | Disposition: A | Discharge status: Home / Self Care | Payer: Medicare Other | Source: Ambulatory Visit | Attending: Radiation Oncology | Admitting: Radiation Oncology

## 2015-05-06 VITALS — BP 93/52 | HR 72 | Temp 97.7°F | Resp 20 | Ht 63.0 in | Wt 183.8 lb

## 2015-05-06 DIAGNOSIS — C3412 Malignant neoplasm of upper lobe, left bronchus or lung: Secondary | ICD-10-CM

## 2015-05-06 NOTE — Progress Notes (Signed)
Radiation Oncology         (336) [phone] ________________________________  Name: Anne Sanders MRN: [id number]  Date: 05/06/2015  DOB: [birth year]  Follow-Up Visit Note  CC: Nathania Waldman, [name], FNP  Melrose Nakayama, *  Diagnosis:      ICD-9-CM ICD-10-CM   1. Primary malignant neoplasm of left upper lobe of lung (HCC) 162.3 C34.12      Interval Since Last Radiation:  Approximately 1 month   Narrative:  The patient returns today for routine follow-up.   Follow up s/p SBRT radiation to lung, 03/21/14-03/29/15, was in the hospital recently for CHF at [facility], , increased patient's lasix and  Potassium, poor appetite , home health nurse to house 2x week, energy poor, oxygen 1 liter n/c=95%, more confused states daughter, no pain, no nausea at present, no head aches,  5:19 PM BP 93/52 mmHg  Pulse 72  Temp(Src) 97.7 F (36.5 C) (Oral)  Resp 20  Ht '5\' 3"'$  (1.6 m)  Wt 183 lb 12.8 oz (83.371 kg)  BMI 32.57 kg/m2  SpO2 95%  Wt Readings from Last 3 Encounters:  05/06/15 183 lb 12.8 oz (83.371 kg)  03/29/15 207 lb 1.6 oz (93.94 kg)  03/29/15 207 lb 1.6 oz (93.94 kg)                                ALLERGIES:  has No Known Allergies.  Meds: Current Outpatient Prescriptions  Medication Sig Dispense Refill  . budesonide (PULMICORT) 0.5 MG/2ML nebulizer solution Take 0.5 mg by nebulization daily as needed (for wheezing).     Marland Kitchen buPROPion (WELLBUTRIN SR) 150 MG 12 hr tablet Take 150 mg by mouth 2 (two) times daily.    . digoxin (LANOXIN) 0.125 MG tablet Take 0.125 mg by mouth 2 (two) times daily.     Marland Kitchen gabapentin (NEURONTIN) 100 MG capsule Take 100 mg by mouth 2 (two) times daily. Antibiotic started unsure name, big white pill for her infectionin her legs, sees MD Friday 11/07/13    . glimepiride (AMARYL) 4 MG tablet Take 4 mg by mouth 2 (two) times daily.    . insulin NPH Human (HUMULIN N,NOVOLIN N) 100 UNIT/ML injection Inject 45 Units into the skin daily. Increased to 90  Units  daily    . ipratropium-albuterol (DUONEB) 0.5-2.5 (3) MG/3ML SOLN Take 3 mLs by nebulization every 6 (six) hours as needed (for shortness of breath).     Marland Kitchen lisinopril (PRINIVIL,ZESTRIL) 2.5 MG tablet Take 2.5 mg by mouth 2 (two) times daily.    . Melatonin 300 MCG TABS Take 1 tablet by mouth at bedtime as needed (for sleep).     . metoprolol succinate (TOPROL-XL) 100 MG 24 hr tablet Take 100 mg by mouth daily. Take with or immediately following a meal.    . Multiple Vitamin (MULTIVITAMIN) tablet Take 1 tablet by mouth daily.    Marland Kitchen omeprazole (PRILOSEC) 20 MG capsule Take 20 mg by mouth 2 (two) times daily before a meal.    . potassium chloride SA (K-DUR,KLOR-CON) 20 MEQ tablet Take 20 mEq by mouth daily.    . pravastatin (PRAVACHOL) 80 MG tablet Take 80 mg by mouth at bedtime.    Marland Kitchen PRESCRIPTION MEDICATION Take 1 tablet by mouth 2 (two) times daily. Reported on 03/29/2015    . sitaGLIPtin (JANUVIA) 50 MG tablet Take 50 mg by mouth daily.    Alveda Reasons 20 MG TABS tablet Take  20 mg by mouth daily.     . furosemide (LASIX) 80 MG tablet Take 80 mg by mouth 2 (two) times daily. 3 tabs '40mg'$  in am, 1.5 tablet of 40 mg in the evenings    . LORazepam (ATIVAN) 0.5 MG tablet Take 1 tablet (0.5 mg total) by mouth once as needed for anxiety. Take 30 min prior to radiation treatment. (Patient not taking: Reported on 02/26/2015) 5 tablet 0  . TRULICITY 1.5 XE/9.4MH SOPN Take 5 mLs by mouth once a week.     No current facility-administered medications for this encounter.    Physical Findings: The patient is in no acute distress. Patient is alert and oriented.  height is '5\' 3"'$  (1.6 m) and weight is 183 lb 12.8 oz (83.371 kg). Her oral temperature is 97.7 F (36.5 C). Her blood pressure is 93/52 and her pulse is 72. Her respiration is 20 and oxygen saturation is 95%. .     Lab Findings: Lab Results  Component Value Date   WBC 6.9 03/20/2013   HGB 11.6* 03/20/2013   HCT 35.9* 03/20/2013   MCV 88.0 03/20/2013    PLT 130* 03/20/2013     Radiographic Findings: No results found.  Impression:    The patient is not having any difficulties in terms of acute toxicity from treatment. She is recovering from an episode of CHF where she was hospitalized.   Plan:  We will have the patient return to clinic in 3 months after undergoing a repeat CT scan of the chest.   Jodelle Gross, M.D., Ph.D.

## 2015-05-06 NOTE — Progress Notes (Addendum)
Follow up s/p SBRT radiation to lung, 03/21/14-03/29/15, was in the hospital recently for CHF at [facility], , increased patient's lasix and  Potassium, poor appetite , home health nurse to house 2x week, energy poor, oxygen 1 liter n/c=95%, more confused states daughter, no pain, no nausea at present, no head aches,  5:02 PM BP 93/52 mmHg  Pulse 72  Temp(Src) 97.7 F (36.5 C) (Oral)  Resp 20  Ht '5\' 3"'$  (1.6 m)  Wt 183 lb 12.8 oz (83.371 kg)  BMI 32.57 kg/m2  SpO2 95%  Wt Readings from Last 3 Encounters:  05/06/15 183 lb 12.8 oz (83.371 kg)  03/29/15 207 lb 1.6 oz (93.94 kg)  03/29/15 207 lb 1.6 oz (93.94 kg)

## 2015-05-07 ENCOUNTER — Telehealth: Payer: Self-pay | Admitting: *Deleted

## 2015-05-07 DIAGNOSIS — J189 Pneumonia, unspecified organism: Secondary | ICD-10-CM | POA: Diagnosis not present

## 2015-05-07 DIAGNOSIS — C3490 Malignant neoplasm of unspecified part of unspecified bronchus or lung: Secondary | ICD-10-CM | POA: Diagnosis not present

## 2015-05-07 DIAGNOSIS — J9611 Chronic respiratory failure with hypoxia: Secondary | ICD-10-CM | POA: Diagnosis not present

## 2015-05-07 DIAGNOSIS — Z7901 Long term (current) use of anticoagulants: Secondary | ICD-10-CM | POA: Diagnosis not present

## 2015-05-07 DIAGNOSIS — Z9981 Dependence on supplemental oxygen: Secondary | ICD-10-CM | POA: Diagnosis not present

## 2015-05-07 DIAGNOSIS — J440 Chronic obstructive pulmonary disease with acute lower respiratory infection: Secondary | ICD-10-CM | POA: Diagnosis not present

## 2015-05-07 DIAGNOSIS — I270 Primary pulmonary hypertension: Secondary | ICD-10-CM | POA: Diagnosis not present

## 2015-05-07 DIAGNOSIS — J441 Chronic obstructive pulmonary disease with (acute) exacerbation: Secondary | ICD-10-CM | POA: Diagnosis not present

## 2015-05-07 DIAGNOSIS — I5033 Acute on chronic diastolic (congestive) heart failure: Secondary | ICD-10-CM | POA: Diagnosis not present

## 2015-05-07 DIAGNOSIS — I110 Hypertensive heart disease with heart failure: Secondary | ICD-10-CM | POA: Diagnosis not present

## 2015-05-07 DIAGNOSIS — I482 Chronic atrial fibrillation: Secondary | ICD-10-CM | POA: Diagnosis not present

## 2015-05-07 DIAGNOSIS — G4733 Obstructive sleep apnea (adult) (pediatric): Secondary | ICD-10-CM | POA: Diagnosis not present

## 2015-05-07 NOTE — Telephone Encounter (Signed)
CALLED PATIENT TO INFORM OF LAB, CT FOR 08-18-15, AND FU FOR 08-19-15 TO GET HER RESULTS FROM DR. MOODY, SPOKE WITH PATIENT AND SHE IS AWARE OF THESE APPTS.

## 2015-05-10 DIAGNOSIS — R8290 Unspecified abnormal findings in urine: Secondary | ICD-10-CM | POA: Diagnosis not present

## 2015-05-11 DIAGNOSIS — Z7901 Long term (current) use of anticoagulants: Secondary | ICD-10-CM | POA: Diagnosis not present

## 2015-05-11 DIAGNOSIS — J441 Chronic obstructive pulmonary disease with (acute) exacerbation: Secondary | ICD-10-CM | POA: Diagnosis not present

## 2015-05-11 DIAGNOSIS — J440 Chronic obstructive pulmonary disease with acute lower respiratory infection: Secondary | ICD-10-CM | POA: Diagnosis not present

## 2015-05-11 DIAGNOSIS — I270 Primary pulmonary hypertension: Secondary | ICD-10-CM | POA: Diagnosis not present

## 2015-05-11 DIAGNOSIS — G4733 Obstructive sleep apnea (adult) (pediatric): Secondary | ICD-10-CM | POA: Diagnosis not present

## 2015-05-11 DIAGNOSIS — I110 Hypertensive heart disease with heart failure: Secondary | ICD-10-CM | POA: Diagnosis not present

## 2015-05-11 DIAGNOSIS — C3490 Malignant neoplasm of unspecified part of unspecified bronchus or lung: Secondary | ICD-10-CM | POA: Diagnosis not present

## 2015-05-11 DIAGNOSIS — J9611 Chronic respiratory failure with hypoxia: Secondary | ICD-10-CM | POA: Diagnosis not present

## 2015-05-11 DIAGNOSIS — J189 Pneumonia, unspecified organism: Secondary | ICD-10-CM | POA: Diagnosis not present

## 2015-05-11 DIAGNOSIS — I482 Chronic atrial fibrillation: Secondary | ICD-10-CM | POA: Diagnosis not present

## 2015-05-11 DIAGNOSIS — I5033 Acute on chronic diastolic (congestive) heart failure: Secondary | ICD-10-CM | POA: Diagnosis not present

## 2015-05-11 DIAGNOSIS — Z9981 Dependence on supplemental oxygen: Secondary | ICD-10-CM | POA: Diagnosis not present

## 2015-05-14 DIAGNOSIS — R300 Dysuria: Secondary | ICD-10-CM | POA: Diagnosis not present

## 2015-05-14 DIAGNOSIS — J449 Chronic obstructive pulmonary disease, unspecified: Secondary | ICD-10-CM | POA: Diagnosis not present

## 2015-05-18 DIAGNOSIS — J189 Pneumonia, unspecified organism: Secondary | ICD-10-CM | POA: Diagnosis not present

## 2015-05-18 DIAGNOSIS — I110 Hypertensive heart disease with heart failure: Secondary | ICD-10-CM | POA: Diagnosis not present

## 2015-05-18 DIAGNOSIS — Z7901 Long term (current) use of anticoagulants: Secondary | ICD-10-CM | POA: Diagnosis not present

## 2015-05-18 DIAGNOSIS — J441 Chronic obstructive pulmonary disease with (acute) exacerbation: Secondary | ICD-10-CM | POA: Diagnosis not present

## 2015-05-18 DIAGNOSIS — J9611 Chronic respiratory failure with hypoxia: Secondary | ICD-10-CM | POA: Diagnosis not present

## 2015-05-18 DIAGNOSIS — Z9981 Dependence on supplemental oxygen: Secondary | ICD-10-CM | POA: Diagnosis not present

## 2015-05-18 DIAGNOSIS — J440 Chronic obstructive pulmonary disease with acute lower respiratory infection: Secondary | ICD-10-CM | POA: Diagnosis not present

## 2015-05-18 DIAGNOSIS — C3490 Malignant neoplasm of unspecified part of unspecified bronchus or lung: Secondary | ICD-10-CM | POA: Diagnosis not present

## 2015-05-18 DIAGNOSIS — G4733 Obstructive sleep apnea (adult) (pediatric): Secondary | ICD-10-CM | POA: Diagnosis not present

## 2015-05-18 DIAGNOSIS — I5033 Acute on chronic diastolic (congestive) heart failure: Secondary | ICD-10-CM | POA: Diagnosis not present

## 2015-05-18 DIAGNOSIS — I482 Chronic atrial fibrillation: Secondary | ICD-10-CM | POA: Diagnosis not present

## 2015-05-18 DIAGNOSIS — I270 Primary pulmonary hypertension: Secondary | ICD-10-CM | POA: Diagnosis not present

## 2015-05-25 DIAGNOSIS — Z7901 Long term (current) use of anticoagulants: Secondary | ICD-10-CM | POA: Diagnosis not present

## 2015-05-25 DIAGNOSIS — I270 Primary pulmonary hypertension: Secondary | ICD-10-CM | POA: Diagnosis not present

## 2015-05-25 DIAGNOSIS — I5033 Acute on chronic diastolic (congestive) heart failure: Secondary | ICD-10-CM | POA: Diagnosis not present

## 2015-05-25 DIAGNOSIS — J440 Chronic obstructive pulmonary disease with acute lower respiratory infection: Secondary | ICD-10-CM | POA: Diagnosis not present

## 2015-05-25 DIAGNOSIS — J189 Pneumonia, unspecified organism: Secondary | ICD-10-CM | POA: Diagnosis not present

## 2015-05-25 DIAGNOSIS — I110 Hypertensive heart disease with heart failure: Secondary | ICD-10-CM | POA: Diagnosis not present

## 2015-05-25 DIAGNOSIS — C3490 Malignant neoplasm of unspecified part of unspecified bronchus or lung: Secondary | ICD-10-CM | POA: Diagnosis not present

## 2015-05-25 DIAGNOSIS — G4733 Obstructive sleep apnea (adult) (pediatric): Secondary | ICD-10-CM | POA: Diagnosis not present

## 2015-05-25 DIAGNOSIS — Z9981 Dependence on supplemental oxygen: Secondary | ICD-10-CM | POA: Diagnosis not present

## 2015-05-25 DIAGNOSIS — I482 Chronic atrial fibrillation: Secondary | ICD-10-CM | POA: Diagnosis not present

## 2015-05-25 DIAGNOSIS — J441 Chronic obstructive pulmonary disease with (acute) exacerbation: Secondary | ICD-10-CM | POA: Diagnosis not present

## 2015-05-25 DIAGNOSIS — J9611 Chronic respiratory failure with hypoxia: Secondary | ICD-10-CM | POA: Diagnosis not present

## 2015-05-26 DIAGNOSIS — I509 Heart failure, unspecified: Secondary | ICD-10-CM | POA: Diagnosis not present

## 2015-05-31 DIAGNOSIS — J449 Chronic obstructive pulmonary disease, unspecified: Secondary | ICD-10-CM | POA: Diagnosis not present

## 2015-06-01 DIAGNOSIS — I5033 Acute on chronic diastolic (congestive) heart failure: Secondary | ICD-10-CM | POA: Diagnosis not present

## 2015-06-01 DIAGNOSIS — I110 Hypertensive heart disease with heart failure: Secondary | ICD-10-CM | POA: Diagnosis not present

## 2015-06-01 DIAGNOSIS — I270 Primary pulmonary hypertension: Secondary | ICD-10-CM | POA: Diagnosis not present

## 2015-06-01 DIAGNOSIS — I482 Chronic atrial fibrillation: Secondary | ICD-10-CM | POA: Diagnosis not present

## 2015-06-01 DIAGNOSIS — Z7901 Long term (current) use of anticoagulants: Secondary | ICD-10-CM | POA: Diagnosis not present

## 2015-06-01 DIAGNOSIS — J9611 Chronic respiratory failure with hypoxia: Secondary | ICD-10-CM | POA: Diagnosis not present

## 2015-06-01 DIAGNOSIS — G4733 Obstructive sleep apnea (adult) (pediatric): Secondary | ICD-10-CM | POA: Diagnosis not present

## 2015-06-01 DIAGNOSIS — J189 Pneumonia, unspecified organism: Secondary | ICD-10-CM | POA: Diagnosis not present

## 2015-06-01 DIAGNOSIS — J440 Chronic obstructive pulmonary disease with acute lower respiratory infection: Secondary | ICD-10-CM | POA: Diagnosis not present

## 2015-06-01 DIAGNOSIS — J441 Chronic obstructive pulmonary disease with (acute) exacerbation: Secondary | ICD-10-CM | POA: Diagnosis not present

## 2015-06-01 DIAGNOSIS — C3490 Malignant neoplasm of unspecified part of unspecified bronchus or lung: Secondary | ICD-10-CM | POA: Diagnosis not present

## 2015-06-01 DIAGNOSIS — Z9981 Dependence on supplemental oxygen: Secondary | ICD-10-CM | POA: Diagnosis not present

## 2015-06-02 DIAGNOSIS — N189 Chronic kidney disease, unspecified: Secondary | ICD-10-CM | POA: Diagnosis not present

## 2015-06-02 DIAGNOSIS — I509 Heart failure, unspecified: Secondary | ICD-10-CM | POA: Diagnosis not present

## 2015-06-16 DIAGNOSIS — Z79899 Other long term (current) drug therapy: Secondary | ICD-10-CM | POA: Diagnosis not present

## 2015-06-16 DIAGNOSIS — I10 Essential (primary) hypertension: Secondary | ICD-10-CM | POA: Diagnosis not present

## 2015-06-16 DIAGNOSIS — N189 Chronic kidney disease, unspecified: Secondary | ICD-10-CM | POA: Diagnosis not present

## 2015-06-16 DIAGNOSIS — E785 Hyperlipidemia, unspecified: Secondary | ICD-10-CM | POA: Diagnosis not present

## 2015-06-16 DIAGNOSIS — E1165 Type 2 diabetes mellitus with hyperglycemia: Secondary | ICD-10-CM | POA: Diagnosis not present

## 2015-06-16 DIAGNOSIS — D649 Anemia, unspecified: Secondary | ICD-10-CM | POA: Diagnosis not present

## 2015-06-18 DIAGNOSIS — M546 Pain in thoracic spine: Secondary | ICD-10-CM | POA: Diagnosis not present

## 2015-06-30 DIAGNOSIS — J449 Chronic obstructive pulmonary disease, unspecified: Secondary | ICD-10-CM | POA: Diagnosis not present

## 2015-07-20 DIAGNOSIS — I509 Heart failure, unspecified: Secondary | ICD-10-CM | POA: Diagnosis not present

## 2015-07-20 DIAGNOSIS — Z6832 Body mass index (BMI) 32.0-32.9, adult: Secondary | ICD-10-CM | POA: Diagnosis not present

## 2015-07-20 DIAGNOSIS — D649 Anemia, unspecified: Secondary | ICD-10-CM | POA: Diagnosis not present

## 2015-07-20 DIAGNOSIS — C3490 Malignant neoplasm of unspecified part of unspecified bronchus or lung: Secondary | ICD-10-CM | POA: Diagnosis not present

## 2015-07-20 DIAGNOSIS — E669 Obesity, unspecified: Secondary | ICD-10-CM | POA: Diagnosis not present

## 2015-07-20 DIAGNOSIS — L02419 Cutaneous abscess of limb, unspecified: Secondary | ICD-10-CM | POA: Diagnosis not present

## 2015-07-20 DIAGNOSIS — I471 Supraventricular tachycardia: Secondary | ICD-10-CM | POA: Diagnosis not present

## 2015-07-20 DIAGNOSIS — R0602 Shortness of breath: Secondary | ICD-10-CM | POA: Diagnosis not present

## 2015-07-22 DIAGNOSIS — L02419 Cutaneous abscess of limb, unspecified: Secondary | ICD-10-CM | POA: Diagnosis not present

## 2015-07-22 DIAGNOSIS — N189 Chronic kidney disease, unspecified: Secondary | ICD-10-CM | POA: Diagnosis not present

## 2015-07-23 ENCOUNTER — Telehealth: Payer: Self-pay | Admitting: *Deleted

## 2015-07-23 NOTE — Telephone Encounter (Signed)
RETURNED PATIENT'S DAUGHTER PHONE CALL AND ANSWERED HER QUESTIONS

## 2015-07-31 DIAGNOSIS — J449 Chronic obstructive pulmonary disease, unspecified: Secondary | ICD-10-CM | POA: Diagnosis not present

## 2015-08-18 ENCOUNTER — Other Ambulatory Visit: Payer: Self-pay | Admitting: *Deleted

## 2015-08-18 ENCOUNTER — Ambulatory Visit
Admission: RE | Admit: 2015-08-18 | Discharge: 2015-08-18 | Disposition: A | Discharge status: Home / Self Care | Payer: Medicare Other | Source: Ambulatory Visit | Attending: Radiation Oncology | Admitting: Radiation Oncology

## 2015-08-18 ENCOUNTER — Ambulatory Visit (HOSPITAL_COMMUNITY)
Admission: RE | Admit: 2015-08-18 | Discharge: 2015-08-18 | Disposition: A | Discharge status: Home / Self Care | Payer: Medicare Other | Source: Ambulatory Visit | Attending: Radiation Oncology | Admitting: Radiation Oncology

## 2015-08-18 ENCOUNTER — Encounter (HOSPITAL_COMMUNITY): Payer: Self-pay

## 2015-08-18 DIAGNOSIS — E279 Disorder of adrenal gland, unspecified: Secondary | ICD-10-CM | POA: Insufficient documentation

## 2015-08-18 DIAGNOSIS — C3412 Malignant neoplasm of upper lobe, left bronchus or lung: Secondary | ICD-10-CM

## 2015-08-18 DIAGNOSIS — I2510 Atherosclerotic heart disease of native coronary artery without angina pectoris: Secondary | ICD-10-CM | POA: Diagnosis not present

## 2015-08-18 DIAGNOSIS — I700 Atherosclerosis of aorta: Secondary | ICD-10-CM | POA: Insufficient documentation

## 2015-08-18 DIAGNOSIS — C3490 Malignant neoplasm of unspecified part of unspecified bronchus or lung: Secondary | ICD-10-CM | POA: Diagnosis not present

## 2015-08-18 LAB — BUN AND CREATININE (CC13)
BUN: 29.2 mg/dL — ABNORMAL HIGH (ref 7.0–26.0)
Creatinine: 1.5 mg/dL — ABNORMAL HIGH (ref 0.6–1.1)
EGFR: 36 mL/min/{1.73_m2} — ABNORMAL LOW (ref >90)

## 2015-08-18 MED ORDER — IOPAMIDOL (ISOVUE-300) INJECTION 61%
75.0000 mL | Freq: Once | INTRAVENOUS | Status: AC | PRN
  Administered 2015-08-18: 60 mL via INTRAVENOUS

## 2015-08-19 ENCOUNTER — Encounter: Payer: Self-pay | Admitting: Radiation Oncology

## 2015-08-19 ENCOUNTER — Ambulatory Visit
Admission: RE | Admit: 2015-08-19 | Discharge: 2015-08-19 | Disposition: A | Discharge status: Home / Self Care | Payer: Medicare Other | Source: Ambulatory Visit | Attending: Radiation Oncology | Admitting: Radiation Oncology

## 2015-08-19 ENCOUNTER — Other Ambulatory Visit: Payer: Self-pay | Admitting: Radiation Oncology

## 2015-08-19 VITALS — BP 154/74 | HR 66 | Temp 97.8°F | Ht 63.0 in | Wt 183.8 lb

## 2015-08-19 DIAGNOSIS — C3412 Malignant neoplasm of upper lobe, left bronchus or lung: Secondary | ICD-10-CM | POA: Diagnosis not present

## 2015-08-19 DIAGNOSIS — C3411 Malignant neoplasm of upper lobe, right bronchus or lung: Secondary | ICD-10-CM

## 2015-08-19 DIAGNOSIS — Z923 Personal history of irradiation: Secondary | ICD-10-CM | POA: Diagnosis not present

## 2015-08-19 DIAGNOSIS — Z87891 Personal history of nicotine dependence: Secondary | ICD-10-CM | POA: Diagnosis not present

## 2015-08-19 NOTE — Progress Notes (Signed)
Anne Sanders here for report of her recent CT Scan of the Chest.  O2 via [place] at 1-2 liters at all times.  Denies any pain nor discomfort. Skin in prior tx field without any redness, nor hyperpigmentation, and remains intact.

## 2015-08-19 NOTE — Progress Notes (Signed)
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   Interval Since Last Radiation: 4 months   03/21/2014 through 03/29/2015: The tumor in the left upper lobe was treated with a course of stereotactic body radiation treatment. The patient received 54 Gy In 3 fractions at 18 G per fraction.  09/02/2014 through 09/09/2014: The tumor in the right upper lobe was treated with a course of stereotactic body radiation treatment. The patient received 54 Gy In 3 fractions at 18 G per fraction.  04/14/2013 through 04/21/2013: The patient was treated to the lesion within the left upper lobe. She received a course of stereotactic body radiation treatment using to customized fields. This delivered 54 gray in 3 fractions.  Narrative:  The patient returns today for routine follow-up and CT scan report of the chest.  As above, she has been treated on three occasions with SBRT for stage I lung cancer. Her most recent treatment was between February and March 2017. Her imaging studies prior to her radiation revealed a change in the previously treated lesion in the left upper lobe of the lung, but this was not hypermetabolic. There was hypermetabolism in the separation lesion in the same lobe which she was treated for with her most recent SBRT treatment. Her right upper lobe nodule was stable. She proceeded with radiotherapy to the newest left upper lobe lesion.   She comes today to discuss her first post treatment scan. There were changes of about 4 mm of the left upper lobe lesion which was treated in 2015. Stable thoracic nodes were noted in addition to the stable findings of the previously treated lesions.                         Past Medical  History:  Past Medical History  Diagnosis Date  . Hypertension   . DVT (deep vein thrombosis) in pregnancy   . Hyperlipidemia   . Sleep apnea with hypersomnolence   . Chronic rhinitis   . Lung nodules   . Diabetes mellitus without complication ([place])   . COPD (chronic obstructive pulmonary disease) ([facility])     uses 1L of O2 all the time  . Shortness of breath     with exertion  . Pneumonia   . Anxiety   . Pulmonary embolism ([place])   . CHF (congestive heart failure) ([place])   . GERD (gastroesophageal reflux disease)   . Umbilical hernia   . Anemia   . A-fib ([place])     03/20/13; patient was unaware of diagnosis, but hx noted on her 01/22/13 PCP records (Dr. Micheal Likens); saw cardiologist Dr. Geraldo Pitter 08/2011 for afib in the setting of sepsis at [facility]  . History of radiation therapy 04/14/13-04/21/13    lul lung/54Gy/44f sbrt  . History of radiation therapy 09/09/14 completed    SBRT 3/3 lul lung    Past Surgical History: Past Surgical History  Procedure Laterality Date  . Bronchoscopy  11/11/12    negative pathology of right kung mass...Dr. CAlcide Clever . Transbronschial biopsy  11/11/12    right lung mass...neg path..Dr CAlcide Clever . Vaginal hysterectomy      still has both ovaries  and tubes  . Video bronchoscopy with endobronchial navigation N/A 03/25/2013    Procedure: VIDEO BRONCHOSCOPY WITH ENDOBRONCHIAL NAVIGATION;  Surgeon: Grace Isaac, MD;  Location: [facility] OR;  Service: Thoracic;  Laterality: N/A;  . Video bronchoscopy with endobronchial ultrasound N/A 03/25/2013    Procedure: VIDEO BRONCHOSCOPY WITH ENDOBRONCHIAL ULTRASOUND;  Surgeon: Grace Isaac, MD;  Location: [place];  Service: Thoracic;  Laterality: N/A;    Social History:  Social History   Social History  . Marital Status: Widowed    Spouse Name: N/A  . Number of Children: N/A  . Years of Education: N/A   Occupational History  . Not on file.   Social History Main Topics  . Smoking status: Former Smoker -- 1.50  packs/day for 30 years    Types: Cigarettes    Start date: 03/17/1961    Quit date: 03/17/2012  . Smokeless tobacco: Former Systems developer    Types: Snuff     Comment: HX OF EXPOSURE TO [place]  . Alcohol Use: No  . Drug Use: No  . Sexual Activity: Not on file   Other Topics Concern  . Not on file   Social History Narrative    Family History: Family History  Problem Relation Age of Onset  . Heart disease Mother      ALLERGIES:  has No Known Allergies.  Meds: Current Outpatient Prescriptions  Medication Sig Dispense Refill  . budesonide (PULMICORT) 0.5 MG/2ML nebulizer solution Take 0.5 mg by nebulization daily as needed (for wheezing).     Marland Kitchen buPROPion (WELLBUTRIN SR) 150 MG 12 hr tablet Take 150 mg by mouth 2 (two) times daily.    . digoxin (LANOXIN) 0.125 MG tablet Take 0.125 mg by mouth 2 (two) times daily.     . furosemide (LASIX) 80 MG tablet Take 80 mg by mouth 2 (two) times daily. 3 tabs '40mg'$  in am, 1.5 tablet of 40 mg in the evenings    . gabapentin (NEURONTIN) 100 MG capsule Take 100 mg by mouth 2 (two) times daily. Antibiotic started unsure name, big white pill for her infectionin her legs, sees MD Friday 11/07/13    . glimepiride (AMARYL) 4 MG tablet Take 4 mg by mouth 2 (two) times daily.    . insulin NPH Human (HUMULIN N,NOVOLIN N) 100 UNIT/ML injection Inject 45 Units into the skin daily. Increased to 90  Units daily    . ipratropium-albuterol (DUONEB) 0.5-2.5 (3) MG/3ML SOLN Take 3 mLs by nebulization every 6 (six) hours as needed (for shortness of breath).     Marland Kitchen lisinopril (PRINIVIL,ZESTRIL) 2.5 MG tablet Take 2.5 mg by mouth 2 (two) times daily.    Marland Kitchen LORazepam (ATIVAN) 0.5 MG tablet Take 1 tablet (0.5 mg total) by mouth once as needed for anxiety. Take 30 min prior to radiation treatment. 5 tablet 0  . Melatonin 300 MCG TABS Take 1 tablet by mouth at bedtime as needed (for sleep).     . metoprolol succinate (TOPROL-XL) 100 MG 24 hr tablet Take 100 mg by  mouth daily. Take with or immediately following a meal.    . Multiple Vitamin (MULTIVITAMIN) tablet Take 1 tablet by mouth daily.    Marland Kitchen omeprazole (PRILOSEC) 20 MG capsule Take 20 mg by mouth 2 (two) times daily before a meal.    . potassium chloride SA (K-DUR,KLOR-CON) 20 MEQ tablet Take 20 mEq by mouth daily.    . pravastatin (PRAVACHOL) 80 MG tablet Take 80 mg by mouth  at bedtime.    . sitaGLIPtin (JANUVIA) 50 MG tablet Take 50 mg by mouth daily.    . TRULICITY 1.5 WU/9.8JX SOPN Take 5 mLs by mouth once a week.    Alveda Reasons 20 MG TABS tablet Take 20 mg by mouth daily.     Marland Kitchen PRESCRIPTION MEDICATION Take 1 tablet by mouth 2 (two) times daily. Reported on 03/29/2015     No current facility-administered medications for this encounter.    Physical Findings:  height is '5\' 3"'$  (1.6 m) and weight is 183 lb 12.8 oz (83.371 kg). Her temperature is 97.8 F (36.6 C). Her blood pressure is 154/74 and her pulse is 66. Her oxygen saturation is 92%.  In general this is a well appearing caucasian female in no acute distress. She is alert and oriented x4 and appropriate throughout the examination. HEENT reveals that the patient is normocephalic, atraumatic. EOMs are intact. PERRLA. Skin is intact without any evidence of gross lesions. Cardiovascular exam reveals a regular rate and rhythm, no clicks rubs or murmurs are auscultated. Chest is clear to auscultation bilaterally. Lymphatic assessment is performed and does not reveal any adenopathy in the cervical, supraclavicular, axillary, or inguinal chains. Abdomen has active bowel sounds in all quadrants and is intact. The abdomen is soft, non tender, non distended. Lower extremities are negative for pretibial pitting edema, deep calf tenderness, cyanosis or clubbing.  Lab Findings: Lab Results  Component Value Date   WBC 6.9 03/20/2013   HGB 11.6* 03/20/2013   HCT 35.9* 03/20/2013   PLT 130* 03/20/2013    Lab Results  Component Value Date   NA 138 03/20/2013    K 4.7 03/20/2013   CO2 29 03/20/2013   GLUCOSE 322* 03/20/2013   BUN 29.2* 08/18/2015   BUN 37* 03/20/2013   CREATININE 1.5* 08/18/2015   CREATININE 1.46* 03/20/2013   BILITOT 0.3 03/20/2013   ALKPHOS 66 03/20/2013   AST 22 03/20/2013   ALT 19 03/20/2013   PROT 7.6 03/20/2013   ALBUMIN 3.2* 03/20/2013   CALCIUM 9.2 03/20/2013    Radiographic Findings: Ct Chest W Contrast  08/18/2015  CLINICAL DATA:  Restaging lung cancer. Initial diagnosis February 2015. History of radiation therapy. EXAM: CT CHEST WITH CONTRAST TECHNIQUE: Multidetector CT imaging of the chest was performed during intravenous contrast administration. CONTRAST:  29m ISOVUE-300 IOPAMIDOL (ISOVUE-300) INJECTION 61% COMPARISON:  Multiple prior PET CTs and chest CTs. The most recent is 02/25/2015 FINDINGS: Cardiovascular: The heart is upper limits of normal in size and stable. No pericardial effusion. Stable dense 3 vessel coronary artery calcifications. Stable moderate atherosclerotic calcifications involving the thoracic aorta and branch vessels. Stable mild pulmonary artery enlargement suggesting pulmonary hypertension. Mediastinum/Nodes: Stable 9 mm precarinal lymph node on image number 53. Is also stable soft tissue density surrounding the right upper lobe pulmonary artery which is likely radiation change. Small subcarinal lymph node on image 63 is unchanged. Lungs/Pleura: Left apical density paralleling the left major fissure appears slightly thicker and has an area of more discrete nodularity measuring 15 x 11 mm on image number 21. Findings are suspicious for recurrent tumor. PET-CT or biopsy may be helpful for further evaluation. Adjacent pleural thickening is likely radiation change. Ill-defined nodular density adjacent to the major fissure on image number 69 is decrease in size since the prior study and is probably resolving atelectasis with some residual scarring changes and vague nodularity. Stable radiation changes in  the right upper lobe without findings suspicious for recurrent tumor. Ill-defined nodular  density at the right lung base is most likely atelectasis. Stable calcified granuloma at the left lung base. Upper Abdomen: Stable right adrenal gland nodule consistent with benign adenoma. Small retrocrural lymph nodes are stable. Stable cholelithiasis. Musculoskeletal: No significant bony findings. IMPRESSION: 1. Increasing nodular density in the left upper lobe surrounded by radiation change. Findings suspicious for recurrent tumor. Biopsy or PET-CT suggested for further evaluation. 2. Stable radiation changes involving the right upper lobe. No findings for recurrent tumor. 3. Stable small scattered mediastinal and hilar lymph nodes. 4. Resolving left upper lobe sub solid nodule adjacent to the major fissure. 5. New right basilar opacity, likely atelectasis. Attention on future scans is suggested. 6. Stable bilateral adrenal gland nodules. 7. Stable advanced atherosclerotic calcifications involving the thoracic aorta and branch vessels including the coronary arteries. Electronically Signed   By: Marijo Sanes M.D.   On: 08/18/2015 14:17    Impression:  1. 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 The patient's scans are reviewed and compared to previous scans and treatment plans. There is more lateral extension of the nodular change seen on her CT scan. In reviewing her most recent PET scan, it is possible that this finding is scarring from radiotherapy versus new disease. We discussed the new ill defined density in the right upper lobe as well and would recommend repeat imaging in 3 months time. The patient states agreement and understanding, and to contact us sooner if she has questions or concerns prior to that visit.      Carola Rhine, PAC

## 2015-08-26 DIAGNOSIS — E1165 Type 2 diabetes mellitus with hyperglycemia: Secondary | ICD-10-CM | POA: Diagnosis not present

## 2015-08-26 DIAGNOSIS — I10 Essential (primary) hypertension: Secondary | ICD-10-CM | POA: Diagnosis not present

## 2015-08-26 DIAGNOSIS — Z79899 Other long term (current) drug therapy: Secondary | ICD-10-CM | POA: Diagnosis not present

## 2015-08-26 DIAGNOSIS — J441 Chronic obstructive pulmonary disease with (acute) exacerbation: Secondary | ICD-10-CM | POA: Diagnosis not present

## 2015-08-26 DIAGNOSIS — N189 Chronic kidney disease, unspecified: Secondary | ICD-10-CM | POA: Diagnosis not present

## 2015-08-26 DIAGNOSIS — E785 Hyperlipidemia, unspecified: Secondary | ICD-10-CM | POA: Diagnosis not present

## 2015-08-26 DIAGNOSIS — D649 Anemia, unspecified: Secondary | ICD-10-CM | POA: Diagnosis not present

## 2015-08-30 DIAGNOSIS — J449 Chronic obstructive pulmonary disease, unspecified: Secondary | ICD-10-CM | POA: Diagnosis not present

## 2015-09-08 DIAGNOSIS — H2513 Age-related nuclear cataract, bilateral: Secondary | ICD-10-CM | POA: Diagnosis not present

## 2015-09-08 DIAGNOSIS — E113293 Type 2 diabetes mellitus with mild nonproliferative diabetic retinopathy without macular edema, bilateral: Secondary | ICD-10-CM | POA: Diagnosis not present

## 2015-09-30 DIAGNOSIS — J449 Chronic obstructive pulmonary disease, unspecified: Secondary | ICD-10-CM | POA: Diagnosis not present

## 2015-10-15 DIAGNOSIS — E785 Hyperlipidemia, unspecified: Secondary | ICD-10-CM | POA: Diagnosis not present

## 2015-10-15 DIAGNOSIS — I4891 Unspecified atrial fibrillation: Secondary | ICD-10-CM | POA: Diagnosis not present

## 2015-10-15 DIAGNOSIS — D649 Anemia, unspecified: Secondary | ICD-10-CM | POA: Diagnosis not present

## 2015-10-15 DIAGNOSIS — N189 Chronic kidney disease, unspecified: Secondary | ICD-10-CM | POA: Diagnosis not present

## 2015-10-15 DIAGNOSIS — E1165 Type 2 diabetes mellitus with hyperglycemia: Secondary | ICD-10-CM | POA: Diagnosis not present

## 2015-10-15 DIAGNOSIS — M79641 Pain in right hand: Secondary | ICD-10-CM | POA: Diagnosis not present

## 2015-10-19 DIAGNOSIS — C3490 Malignant neoplasm of unspecified part of unspecified bronchus or lung: Secondary | ICD-10-CM | POA: Diagnosis not present

## 2015-10-19 DIAGNOSIS — M1A0410 Idiopathic chronic gout, right hand, without tophus (tophi): Secondary | ICD-10-CM | POA: Diagnosis not present

## 2015-10-19 DIAGNOSIS — N189 Chronic kidney disease, unspecified: Secondary | ICD-10-CM | POA: Diagnosis not present

## 2015-10-19 DIAGNOSIS — M109 Gout, unspecified: Secondary | ICD-10-CM | POA: Diagnosis not present

## 2015-10-19 DIAGNOSIS — I509 Heart failure, unspecified: Secondary | ICD-10-CM | POA: Diagnosis not present

## 2015-10-31 DIAGNOSIS — J449 Chronic obstructive pulmonary disease, unspecified: Secondary | ICD-10-CM | POA: Diagnosis not present

## 2015-11-09 ENCOUNTER — Telehealth: Payer: Self-pay | Admitting: *Deleted

## 2015-11-09 NOTE — Telephone Encounter (Signed)
CALLED PATIENT TO INFORM OF LABS, CT FOR 11-22-15 @ [facility] AND HER FU WITH ALISON PERKINS ON 11-23-15 @ 2 PM, SPOKE WITH PATIENT AND SHE IS AWARE OF THESE APPTS.

## 2015-11-15 DIAGNOSIS — Z23 Encounter for immunization: Secondary | ICD-10-CM | POA: Diagnosis not present

## 2015-11-15 DIAGNOSIS — D649 Anemia, unspecified: Secondary | ICD-10-CM | POA: Diagnosis not present

## 2015-11-15 DIAGNOSIS — M109 Gout, unspecified: Secondary | ICD-10-CM | POA: Diagnosis not present

## 2015-11-15 DIAGNOSIS — N189 Chronic kidney disease, unspecified: Secondary | ICD-10-CM | POA: Diagnosis not present

## 2015-11-15 DIAGNOSIS — I471 Supraventricular tachycardia: Secondary | ICD-10-CM | POA: Diagnosis not present

## 2015-11-15 DIAGNOSIS — J441 Chronic obstructive pulmonary disease with (acute) exacerbation: Secondary | ICD-10-CM | POA: Diagnosis not present

## 2015-11-15 DIAGNOSIS — C3490 Malignant neoplasm of unspecified part of unspecified bronchus or lung: Secondary | ICD-10-CM | POA: Diagnosis not present

## 2015-11-22 ENCOUNTER — Telehealth: Payer: Self-pay | Admitting: *Deleted

## 2015-11-22 ENCOUNTER — Ambulatory Visit
Admission: RE | Admit: 2015-11-22 | Discharge: 2015-11-22 | Disposition: A | Discharge status: Home / Self Care | Payer: Medicare Other | Source: Ambulatory Visit | Attending: Radiation Oncology | Admitting: Radiation Oncology

## 2015-11-22 ENCOUNTER — Ambulatory Visit (HOSPITAL_COMMUNITY)
Admission: RE | Admit: 2015-11-22 | Discharge: 2015-11-22 | Disposition: A | Discharge status: Home / Self Care | Payer: Medicare Other | Source: Ambulatory Visit | Attending: Radiation Oncology | Admitting: Radiation Oncology

## 2015-11-22 ENCOUNTER — Encounter (HOSPITAL_COMMUNITY): Payer: Self-pay

## 2015-11-22 DIAGNOSIS — J701 Chronic and other pulmonary manifestations due to radiation: Secondary | ICD-10-CM | POA: Diagnosis not present

## 2015-11-22 DIAGNOSIS — C3490 Malignant neoplasm of unspecified part of unspecified bronchus or lung: Secondary | ICD-10-CM | POA: Diagnosis not present

## 2015-11-22 DIAGNOSIS — C3412 Malignant neoplasm of upper lobe, left bronchus or lung: Secondary | ICD-10-CM

## 2015-11-22 DIAGNOSIS — R918 Other nonspecific abnormal finding of lung field: Secondary | ICD-10-CM | POA: Insufficient documentation

## 2015-11-22 DIAGNOSIS — C3411 Malignant neoplasm of upper lobe, right bronchus or lung: Secondary | ICD-10-CM | POA: Insufficient documentation

## 2015-11-22 DIAGNOSIS — R911 Solitary pulmonary nodule: Secondary | ICD-10-CM | POA: Diagnosis not present

## 2015-11-22 LAB — BASIC METABOLIC PANEL
ANION GAP: 12 meq/L — AB (ref 3–11)
BUN: 20.6 mg/dL (ref 7.0–26.0)
CO2: 31 mEq/L — ABNORMAL HIGH (ref 22–29)
Calcium: 9.5 mg/dL (ref 8.4–10.4)
Chloride: 99 mEq/L (ref 98–109)
Creatinine: 1.2 mg/dL — ABNORMAL HIGH (ref 0.6–1.1)
EGFR: 44 mL/min/{1.73_m2} — AB (ref >90)
Glucose: 132 mg/dl (ref 70–140)
POTASSIUM: 3.4 meq/L — AB (ref 3.5–5.1)
SODIUM: 143 meq/L (ref 136–145)

## 2015-11-22 MED ORDER — IOPAMIDOL (ISOVUE-300) INJECTION 61%
75.0000 mL | Freq: Once | INTRAVENOUS | Status: AC | PRN
  Administered 2015-11-22: 60 mL via INTRAVENOUS

## 2015-11-22 NOTE — Telephone Encounter (Signed)
Called Ms. Anne Sanders number 3 times with a continued buszy signal, therfor called her daughter Anne Sanders and explained that her mom has a decreased potassium level of 3.4 which is 0.2 pt below the lower range, thus the suggestion is for her to increase her diet with foods that are high in potassium to increase intake with leafy green vegetables, potatoes, fish and bananas.  She stated understanding.

## 2015-11-23 ENCOUNTER — Ambulatory Visit
Admission: RE | Admit: 2015-11-23 | Discharge: 2015-11-23 | Disposition: A | Discharge status: Home / Self Care | Payer: Medicare Other | Source: Ambulatory Visit | Attending: Radiation Oncology | Admitting: Radiation Oncology

## 2015-11-23 ENCOUNTER — Encounter: Payer: Self-pay | Admitting: Radiation Oncology

## 2015-11-23 VITALS — BP 173/67 | HR 103 | Temp 97.5°F | Resp 20 | Ht 63.0 in | Wt 184.8 lb

## 2015-11-23 DIAGNOSIS — Z7901 Long term (current) use of anticoagulants: Secondary | ICD-10-CM | POA: Insufficient documentation

## 2015-11-23 DIAGNOSIS — Z923 Personal history of irradiation: Secondary | ICD-10-CM | POA: Insufficient documentation

## 2015-11-23 DIAGNOSIS — I509 Heart failure, unspecified: Secondary | ICD-10-CM | POA: Diagnosis not present

## 2015-11-23 DIAGNOSIS — D649 Anemia, unspecified: Secondary | ICD-10-CM | POA: Insufficient documentation

## 2015-11-23 DIAGNOSIS — J449 Chronic obstructive pulmonary disease, unspecified: Secondary | ICD-10-CM | POA: Diagnosis not present

## 2015-11-23 DIAGNOSIS — Z86718 Personal history of other venous thrombosis and embolism: Secondary | ICD-10-CM | POA: Insufficient documentation

## 2015-11-23 DIAGNOSIS — I110 Hypertensive heart disease with heart failure: Secondary | ICD-10-CM | POA: Insufficient documentation

## 2015-11-23 DIAGNOSIS — Z8249 Family history of ischemic heart disease and other diseases of the circulatory system: Secondary | ICD-10-CM | POA: Insufficient documentation

## 2015-11-23 DIAGNOSIS — G4730 Sleep apnea, unspecified: Secondary | ICD-10-CM | POA: Diagnosis not present

## 2015-11-23 DIAGNOSIS — Z86711 Personal history of pulmonary embolism: Secondary | ICD-10-CM | POA: Diagnosis not present

## 2015-11-23 DIAGNOSIS — Z90710 Acquired absence of both cervix and uterus: Secondary | ICD-10-CM | POA: Diagnosis not present

## 2015-11-23 DIAGNOSIS — C3411 Malignant neoplasm of upper lobe, right bronchus or lung: Secondary | ICD-10-CM

## 2015-11-23 DIAGNOSIS — Z87891 Personal history of nicotine dependence: Secondary | ICD-10-CM | POA: Insufficient documentation

## 2015-11-23 DIAGNOSIS — R911 Solitary pulmonary nodule: Secondary | ICD-10-CM | POA: Insufficient documentation

## 2015-11-23 DIAGNOSIS — E785 Hyperlipidemia, unspecified: Secondary | ICD-10-CM | POA: Insufficient documentation

## 2015-11-23 DIAGNOSIS — Z170 Estrogen receptor positive status [ER+]: Secondary | ICD-10-CM | POA: Diagnosis not present

## 2015-11-23 DIAGNOSIS — C3412 Malignant neoplasm of upper lobe, left bronchus or lung: Secondary | ICD-10-CM | POA: Diagnosis not present

## 2015-11-23 DIAGNOSIS — K219 Gastro-esophageal reflux disease without esophagitis: Secondary | ICD-10-CM | POA: Insufficient documentation

## 2015-11-23 DIAGNOSIS — E119 Type 2 diabetes mellitus without complications: Secondary | ICD-10-CM | POA: Diagnosis not present

## 2015-11-23 DIAGNOSIS — C50112 Malignant neoplasm of central portion of left female breast: Secondary | ICD-10-CM | POA: Diagnosis not present

## 2015-11-23 DIAGNOSIS — I4891 Unspecified atrial fibrillation: Secondary | ICD-10-CM | POA: Insufficient documentation

## 2015-11-23 DIAGNOSIS — Z9012 Acquired absence of left breast and nipple: Secondary | ICD-10-CM | POA: Diagnosis not present

## 2015-11-23 NOTE — Progress Notes (Signed)
Anne Sanders reports that she does not have any pain in the left, upper lobe region.  Here for results of CT of chest from 11/22/15.  O2 at 2 liters/mi continuously.  Travel by Wheelchair accompanied by her daughter. Reports fatigue.    BP (!) 173/67 Comment: sitting  Pulse (!) 103   Temp 97.5 F (36.4 C) (Oral)   Resp 20   Ht '5\' 3"'$  (1.6 m)   Wt 184 lb 12.8 oz (83.8 kg)   SpO2 94%   BMI 32.74 kg/m      Wt Readings from Last 3 Encounters:  11/23/15 184 lb 12.8 oz (83.8 kg)  08/19/15 183 lb 12.8 oz (83.4 kg)  05/06/15 183 lb 12.8 oz (83.4 kg)

## 2015-11-23 NOTE — Progress Notes (Addendum)
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   Interval Since Last Radiation: 6 months   03/21/2014 through 03/29/2015: The tumor in the left upper lobe was treated with a course of stereotactic body radiation treatment. The patient received 54 Gy In 3 fractions at 18 G per fraction.  09/02/2014 through 09/09/2014: The tumor in the right upper lobe was treated with a course of stereotactic body radiation treatment. The patient received 54 Gy In 3 fractions at 18 G per fraction.  04/14/2013 through 04/21/2013: The patient was treated to the lesion within the left upper lobe. She received a course of stereotactic body radiation treatment using to customized fields. This delivered 54 gray in 3 fractions.  Narrative:  The patient returns today for routine follow-up and CT scan report of the chest.  As above, she has been treated on three occasions with SBRT for stage I lung cancer. Her most recent treatment was between February and March 2017. Her imaging studies prior to her radiation revealed a change in the previously treated lesion in the left upper lobe of the lung, but this was not hypermetabolic. There was hypermetabolism in the separation lesion in the same lobe which she was treated for with her most recent SBRT treatment. Her right upper lobe nodule was stable. She proceeded with radiotherapy to the newest left upper lobe lesion.   Her CT scan on 11/22/15 revealed an increase in the left upper lobe lesion, now measuring 17 x 14 mm, and previously was 11 x 15 in June 2017. Soft tissue thickening inferiorly and laterally was 9.5 mm, and previously was 8.5 mm. RUL scarring is again noted and somewhat progressive without  discrete mass.  Past Medical History:  Past Medical History:  Diagnosis Date  . A-fib ([place])    03/20/13; patient was unaware of diagnosis, but hx noted on her 01/22/13 PCP records (Dr. Micheal Likens); saw cardiologist Dr. Geraldo Pitter 08/2011 for afib in the setting of sepsis at [facility]  . Anemia   . Anxiety   . CHF (congestive heart failure) ([place])   . Chronic rhinitis   . COPD (chronic obstructive pulmonary disease) ([facility])    uses 1L of O2 all the time  . Diabetes mellitus without complication ([place])   . DVT (deep vein thrombosis) in pregnancy ([place])   . GERD (gastroesophageal reflux disease)   . History of radiation therapy 04/14/13-04/21/13   lul lung/54Gy/73f sbrt  . History of radiation therapy 09/09/14 completed   SBRT 3/3 lul lung  . Hyperlipidemia   . Hypertension   . Lung nodules   . Pneumonia   . [place]   . Shortness of breath    with exertion  . Sleep apnea with hypersomnolence   . Umbilical hernia     Past Surgical History: Past Surgical History:  Procedure Laterality Date  . BRONCHOSCOPY  11/11/12   negative pathology of right kung mass...Dr. CAlcide Clever . transbronschial biopsy  11/11/12   right lung mass...neg path..Dr CAlcide Clever . VAGINAL HYSTERECTOMY     still has both ovaries and tubes  . VIDEO BRONCHOSCOPY WITH ENDOBRONCHIAL NAVIGATION N/A 03/25/2013   Procedure: VIDEO BRONCHOSCOPY WITH ENDOBRONCHIAL NAVIGATION;  Surgeon: Grace Isaac, MD;  Location: [facility] OR;  Service: Thoracic;  Laterality: N/A;  . VIDEO BRONCHOSCOPY WITH ENDOBRONCHIAL ULTRASOUND N/A 03/25/2013   Procedure: VIDEO BRONCHOSCOPY WITH ENDOBRONCHIAL ULTRASOUND;  Surgeon: Grace Isaac, MD;  Location: [place];  Service: Thoracic;  Laterality: N/A;    Social History:  Social History   Social History  . Marital status: Widowed    Spouse name: N/A  . Number of children: N/A  . Years of education: N/A   Occupational History  . Not on file.   Social History Main Topics  . Smoking  status: Former Smoker    Packs/day: 1.50    Years: 30.00    Types: Cigarettes    Start date: 03/17/1961    Quit date: 03/17/2012  . Smokeless tobacco: Former Systems developer    Types: Snuff     Comment: HX OF EXPOSURE TO [place]  . Alcohol use No  . Drug use: No  . Sexual activity: Not on file   Other Topics Concern  . Not on file   Social History Narrative  . No narrative on file  The patient is widowed and accompanied by her daughter [name].   Family History: Family History  Problem Relation Age of Onset  . Heart disease Mother      ALLERGIES:  has No Known Allergies.  Meds: Current Outpatient Prescriptions  Medication Sig Dispense Refill  . budesonide (PULMICORT) 0.5 MG/2ML nebulizer solution Take 0.5 mg by nebulization daily as needed (for wheezing).     Marland Kitchen buPROPion (WELLBUTRIN SR) 150 MG 12 hr tablet Take 150 mg by mouth 2 (two) times daily.    . digoxin (LANOXIN) 0.125 MG tablet Take 0.125 mg by mouth 2 (two) times daily.     . furosemide (LASIX) 80 MG tablet Take 80 mg by mouth 2 (two) times daily. 3 tabs '40mg'$  in am, 1.5 tablet of 40 mg in the evenings    . gabapentin (NEURONTIN) 100 MG capsule Take 100 mg by mouth 2 (two) times daily. Antibiotic started unsure name, big white pill for her infectionin her legs, sees MD Friday 11/07/13    . glimepiride (AMARYL) 4 MG tablet Take 4 mg by mouth 2 (two) times daily.    . insulin NPH Human (HUMULIN N,NOVOLIN N) 100 UNIT/ML injection Inject 45 Units into the skin daily. Increased to 90  Units daily    . ipratropium-albuterol (DUONEB) 0.5-2.5 (3) MG/3ML SOLN Take 3 mLs by nebulization every 6 (six) hours as needed (for shortness of breath).     Marland Kitchen lisinopril (PRINIVIL,ZESTRIL) 2.5 MG tablet Take 2.5 mg by mouth 2 (two) times daily.    Marland Kitchen LORazepam (ATIVAN) 0.5 MG tablet Take 1 tablet (0.5 mg total) by mouth once as needed for anxiety. Take 30 min prior to radiation treatment. 5 tablet 0  . Melatonin 300 MCG TABS Take 1 tablet  by mouth at bedtime as needed (for sleep).     . metoprolol succinate (TOPROL-XL) 100 MG 24 hr tablet Take 100 mg by mouth daily. Take with or immediately following a meal.    . Multiple Vitamin (MULTIVITAMIN) tablet Take 1 tablet by mouth daily.    Marland Kitchen omeprazole (PRILOSEC) 20 MG capsule Take 20 mg by mouth 2 (two) times daily before a meal.    . potassium chloride SA (K-DUR,KLOR-CON) 20 MEQ tablet Take 20 mEq by mouth daily.    . pravastatin (PRAVACHOL) 80 MG tablet Take 80 mg by mouth at bedtime.    Marland Kitchen  sitaGLIPtin (JANUVIA) 50 MG tablet Take 50 mg by mouth daily.    . TRULICITY 1.5 PZ/0.2HE SOPN Take 5 mLs by mouth once a week.    Alveda Reasons 20 MG TABS tablet Take 20 mg by mouth daily.      No current facility-administered medications for this encounter.     Physical Findings:  height is '5\' 3"'$  (1.6 m) and weight is 184 lb 12.8 oz (83.8 kg). Her oral temperature is 97.5 F (36.4 C). Her blood pressure is 173/67 (abnormal) and her pulse is 103 (abnormal). Her respiration is 20 and oxygen saturation is 94%.  In general this is a well appearing caucasian female in no acute distress. She is alert and oriented x4 and appropriate throughout the examination. HEENT reveals that the patient is normocephalic, atraumatic. EOMs are intact. PERRLA. Skin is intact without any evidence of gross lesions. Cardiovascular exam reveals a regular rate and rhythm, no clicks rubs or murmurs are auscultated. Chest is clear to auscultation bilaterally. Lymphatic assessment is performed and does not reveal any adenopathy in the cervical, supraclavicular, axillary, or inguinal chains. Abdomen has active bowel sounds in all quadrants and is intact. The abdomen is soft, non tender, non distended. Lower extremities are negative for pretibial pitting edema, deep calf tenderness, cyanosis or clubbing.  Lab Findings: Lab Results  Component Value Date   WBC 6.9 03/20/2013   HGB 11.6 (L) 03/20/2013   HCT 35.9 (L) 03/20/2013   PLT  130 (L) 03/20/2013    Lab Results  Component Value Date   NA 143 11/22/2015   K 3.4 (L) 11/22/2015   CHLORIDE 99 11/22/2015   CO2 31 (H) 11/22/2015   GLUCOSE 132 11/22/2015   BUN 20.6 11/22/2015   CREATININE 1.2 (H) 11/22/2015   BILITOT 0.3 03/20/2013   ALKPHOS 66 03/20/2013   AST 22 03/20/2013   ALT 19 03/20/2013   PROT 7.6 03/20/2013   ALBUMIN 3.2 (L) 03/20/2013   CALCIUM 9.5 11/22/2015   ANIONGAP 12 (H) 11/22/2015    Radiographic Findings: Ct Chest W Contrast  Result Date: 11/22/2015 CLINICAL DATA:  Restaging lung cancer. EXAM: CT CHEST WITH CONTRAST TECHNIQUE: Multidetector CT imaging of the chest was performed during intravenous contrast administration. CONTRAST:  58m ISOVUE-300 IOPAMIDOL (ISOVUE-300) INJECTION 61% COMPARISON:  08/18/2015. FINDINGS: Chest wall: No breast masses, supraclavicular or axillary lymphadenopathy. The thyroid gland is grossly normal. Small nodules are stable. Cardiovascular: The heart is mildly enlarged but unchanged. No pericardial effusion. Stable atherosclerotic calcifications involving the thoracic aorta but no focal aneurysm or dissection. Stable three-vessel coronary artery calcifications. Mediastinum/Nodes: Stable mediastinal and hilar lymph nodes. 8.5 mm precarinal lymph node on image 20 is stable. Stable 7 mm subcarinal lymph node on image number 22. The esophagus is grossly normal. Lungs/Pleura: The eat dense area of scarring change in the left lung apex is again demonstrated. Focal area of more nodular thickening centrally has enlarged slightly since the prior study. It measures a maximum of 17 x 14 mm and previously measured 11 x 15 mm. More inferiorly and laterally along this area scarring there is mild soft tissue thickening. The maximum diameter is 9.5 mm and was previously 8.5 mm. Right upper lobe scarring changes are somewhat progressive and this is likely progressive radiation fibrosis. I do not see a discrete mass. Right middle lobe  pulmonary nodule on image number 57. It measures 4.5 mm and previously measured 3.5 mm. Recommend close observation. Increasing density along the left major fissure. This could be radiation  change, inflammation or atelectasis. Tumor is doubtful but recommend attention on future scans. The right lower lobe process has resolved and was likely inflammation or atelectasis. Stable calcified granuloma at the left lung base. Upper Abdomen: Stable bilateral thyroid gland nodules. No findings for hepatic metastatic disease. No upper abdominal lymphadenopathy. A few small scattered subpleural lymph nodes are again noted. Stable atherosclerotic calcifications involving the aorta and branch vessels. Musculoskeletal: No significant bony findings. No findings worrisome for metastatic disease. IMPRESSION: 1. Slight interval increase and soft tissue thickening in the left lung apex. Recommend close observation or PET-CT for further evaluation. 2. 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 3. Slight interval enlargement of the right middle lobe pulmonary nodule. Recommend close observation. 4. Increasing density on both sides of the major fissure on the left side without discrete mass or nodule. 5. Resolution of right basilar process. 6. Stable small scattered mediastinal lymph nodes. Electronically Signed   By: Marijo Sanes M.D.   On: 11/22/2015 14:04    Impression:  1. 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 The patient's scans are reviewed and compared to previous scans and treatment plans. There is more lateral extension of the nodular change seen on her CT scan as well as progressive change in the left upper lobe lesion. She is counseled on the role for repeat PET scan, and we discussed that we could present her case in thoracic oncology conference if her PET is positive. The patient states agreement and understanding, and to contact us sooner if she has questions  or concerns, and after her imaging has been discussed with Dr. Lisbeth Renshaw, we will review recommendations with her by phone. 2. COPD. The patient will continue to follow up with pulmonology at [facility], and continue her O2. We will follow this expectantly.     Carola Rhine, PAC

## 2015-11-25 ENCOUNTER — Telehealth: Payer: Self-pay | Admitting: *Deleted

## 2015-11-25 NOTE — Telephone Encounter (Signed)
Called patient's daughter, [name] to inform of her mom's Pet on 12-03-15- arrival time - 12:30 pm, pt. To be NPO 6 hrs. Prior to test , spoke with patient's daughter, [name] and she is good with this appt. And she is aware that [name] will call with the results

## 2015-11-30 DIAGNOSIS — J449 Chronic obstructive pulmonary disease, unspecified: Secondary | ICD-10-CM | POA: Diagnosis not present

## 2015-12-03 ENCOUNTER — Ambulatory Visit (HOSPITAL_COMMUNITY)
Admission: RE | Admit: 2015-12-03 | Discharge: 2015-12-03 | Disposition: A | Discharge status: Home / Self Care | Payer: Medicare Other | Source: Ambulatory Visit | Attending: Radiation Oncology | Admitting: Radiation Oncology

## 2015-12-03 DIAGNOSIS — C3412 Malignant neoplasm of upper lobe, left bronchus or lung: Secondary | ICD-10-CM

## 2015-12-03 DIAGNOSIS — R918 Other nonspecific abnormal finding of lung field: Secondary | ICD-10-CM | POA: Insufficient documentation

## 2015-12-03 DIAGNOSIS — Z923 Personal history of irradiation: Secondary | ICD-10-CM | POA: Insufficient documentation

## 2015-12-03 DIAGNOSIS — C3490 Malignant neoplasm of unspecified part of unspecified bronchus or lung: Secondary | ICD-10-CM | POA: Diagnosis not present

## 2015-12-03 LAB — GLUCOSE, CAPILLARY: GLUCOSE-CAPILLARY: 145 mg/dL — AB (ref 65–99)

## 2015-12-03 MED ORDER — FLUDEOXYGLUCOSE F - 18 (FDG) INJECTION
9.2400 | Freq: Once | INTRAVENOUS | Status: AC | PRN
  Administered 2015-12-03: 9.24 via INTRAVENOUS

## 2015-12-08 ENCOUNTER — Telehealth: Payer: Self-pay | Admitting: *Deleted

## 2015-12-08 NOTE — Telephone Encounter (Signed)
Call from pt's daughter, [name], asking for PET scan results.  Forwarded her call to Dr. Ida Rogue Nurse, Thayer Headings, VM.

## 2015-12-08 NOTE — Telephone Encounter (Signed)
[name] family member called requesting PEt scan results done 12/03/15, she can be reached at [phone] 3:32 PM

## 2015-12-08 NOTE — Telephone Encounter (Signed)
Her case is being discussed at conference tomorrow morning, can you let them know I will follow up tomorrow?

## 2015-12-09 ENCOUNTER — Telehealth: Payer: Self-pay | Admitting: *Deleted

## 2015-12-09 ENCOUNTER — Telehealth: Payer: Self-pay | Admitting: Radiation Oncology

## 2015-12-09 DIAGNOSIS — C3412 Malignant neoplasm of upper lobe, left bronchus or lung: Secondary | ICD-10-CM

## 2015-12-09 NOTE — Telephone Encounter (Signed)
Called patient daughter per Alion.our PA she asked me to let you know that paatiet's case was being discussed in conference this morning and after she would follow with you,daughter thanked this Rn for the call and awaiting call from the PA 9:20 AM

## 2015-12-09 NOTE — Telephone Encounter (Signed)
I called and spoke with the patient to let her know her PET results. We will rescan with CT in 3 months time and follow up face to face to review.

## 2015-12-15 DIAGNOSIS — R29898 Other symptoms and signs involving the musculoskeletal system: Secondary | ICD-10-CM | POA: Diagnosis not present

## 2015-12-15 DIAGNOSIS — I10 Essential (primary) hypertension: Secondary | ICD-10-CM | POA: Diagnosis not present

## 2015-12-15 DIAGNOSIS — I82409 Acute embolism and thrombosis of unspecified deep veins of unspecified lower extremity: Secondary | ICD-10-CM | POA: Diagnosis not present

## 2015-12-15 DIAGNOSIS — I50810 Right heart failure, unspecified: Secondary | ICD-10-CM | POA: Diagnosis not present

## 2015-12-17 DIAGNOSIS — L97911 Non-pressure chronic ulcer of unspecified part of right lower leg limited to breakdown of skin: Secondary | ICD-10-CM | POA: Diagnosis not present

## 2015-12-17 DIAGNOSIS — I872 Venous insufficiency (chronic) (peripheral): Secondary | ICD-10-CM | POA: Diagnosis not present

## 2015-12-17 DIAGNOSIS — Z9981 Dependence on supplemental oxygen: Secondary | ICD-10-CM | POA: Diagnosis not present

## 2015-12-17 DIAGNOSIS — I482 Chronic atrial fibrillation: Secondary | ICD-10-CM | POA: Diagnosis not present

## 2015-12-17 DIAGNOSIS — Z7901 Long term (current) use of anticoagulants: Secondary | ICD-10-CM | POA: Diagnosis not present

## 2015-12-17 DIAGNOSIS — C3490 Malignant neoplasm of unspecified part of unspecified bronchus or lung: Secondary | ICD-10-CM | POA: Diagnosis not present

## 2015-12-17 DIAGNOSIS — I5032 Chronic diastolic (congestive) heart failure: Secondary | ICD-10-CM | POA: Diagnosis not present

## 2015-12-17 DIAGNOSIS — L97921 Non-pressure chronic ulcer of unspecified part of left lower leg limited to breakdown of skin: Secondary | ICD-10-CM | POA: Diagnosis not present

## 2015-12-17 DIAGNOSIS — J449 Chronic obstructive pulmonary disease, unspecified: Secondary | ICD-10-CM | POA: Diagnosis not present

## 2015-12-17 DIAGNOSIS — N189 Chronic kidney disease, unspecified: Secondary | ICD-10-CM | POA: Diagnosis not present

## 2015-12-17 DIAGNOSIS — E1122 Type 2 diabetes mellitus with diabetic chronic kidney disease: Secondary | ICD-10-CM | POA: Diagnosis not present

## 2015-12-17 DIAGNOSIS — E1140 Type 2 diabetes mellitus with diabetic neuropathy, unspecified: Secondary | ICD-10-CM | POA: Diagnosis not present

## 2015-12-17 DIAGNOSIS — I130 Hypertensive heart and chronic kidney disease with heart failure and stage 1 through stage 4 chronic kidney disease, or unspecified chronic kidney disease: Secondary | ICD-10-CM | POA: Diagnosis not present

## 2015-12-17 DIAGNOSIS — Z794 Long term (current) use of insulin: Secondary | ICD-10-CM | POA: Diagnosis not present

## 2015-12-17 DIAGNOSIS — Z87891 Personal history of nicotine dependence: Secondary | ICD-10-CM | POA: Diagnosis not present

## 2015-12-20 DIAGNOSIS — Z7901 Long term (current) use of anticoagulants: Secondary | ICD-10-CM | POA: Diagnosis not present

## 2015-12-20 DIAGNOSIS — J449 Chronic obstructive pulmonary disease, unspecified: Secondary | ICD-10-CM | POA: Diagnosis not present

## 2015-12-20 DIAGNOSIS — Z9981 Dependence on supplemental oxygen: Secondary | ICD-10-CM | POA: Diagnosis not present

## 2015-12-20 DIAGNOSIS — I5032 Chronic diastolic (congestive) heart failure: Secondary | ICD-10-CM | POA: Diagnosis not present

## 2015-12-20 DIAGNOSIS — Z87891 Personal history of nicotine dependence: Secondary | ICD-10-CM | POA: Diagnosis not present

## 2015-12-20 DIAGNOSIS — I872 Venous insufficiency (chronic) (peripheral): Secondary | ICD-10-CM | POA: Diagnosis not present

## 2015-12-20 DIAGNOSIS — I482 Chronic atrial fibrillation: Secondary | ICD-10-CM | POA: Diagnosis not present

## 2015-12-20 DIAGNOSIS — C3490 Malignant neoplasm of unspecified part of unspecified bronchus or lung: Secondary | ICD-10-CM | POA: Diagnosis not present

## 2015-12-20 DIAGNOSIS — E1122 Type 2 diabetes mellitus with diabetic chronic kidney disease: Secondary | ICD-10-CM | POA: Diagnosis not present

## 2015-12-20 DIAGNOSIS — Z794 Long term (current) use of insulin: Secondary | ICD-10-CM | POA: Diagnosis not present

## 2015-12-20 DIAGNOSIS — L97911 Non-pressure chronic ulcer of unspecified part of right lower leg limited to breakdown of skin: Secondary | ICD-10-CM | POA: Diagnosis not present

## 2015-12-20 DIAGNOSIS — N189 Chronic kidney disease, unspecified: Secondary | ICD-10-CM | POA: Diagnosis not present

## 2015-12-20 DIAGNOSIS — L97921 Non-pressure chronic ulcer of unspecified part of left lower leg limited to breakdown of skin: Secondary | ICD-10-CM | POA: Diagnosis not present

## 2015-12-20 DIAGNOSIS — E1140 Type 2 diabetes mellitus with diabetic neuropathy, unspecified: Secondary | ICD-10-CM | POA: Diagnosis not present

## 2015-12-20 DIAGNOSIS — I130 Hypertensive heart and chronic kidney disease with heart failure and stage 1 through stage 4 chronic kidney disease, or unspecified chronic kidney disease: Secondary | ICD-10-CM | POA: Diagnosis not present

## 2015-12-23 DIAGNOSIS — N189 Chronic kidney disease, unspecified: Secondary | ICD-10-CM | POA: Diagnosis not present

## 2015-12-23 DIAGNOSIS — Z7901 Long term (current) use of anticoagulants: Secondary | ICD-10-CM | POA: Diagnosis not present

## 2015-12-23 DIAGNOSIS — J449 Chronic obstructive pulmonary disease, unspecified: Secondary | ICD-10-CM | POA: Diagnosis not present

## 2015-12-23 DIAGNOSIS — I5032 Chronic diastolic (congestive) heart failure: Secondary | ICD-10-CM | POA: Diagnosis not present

## 2015-12-23 DIAGNOSIS — L97921 Non-pressure chronic ulcer of unspecified part of left lower leg limited to breakdown of skin: Secondary | ICD-10-CM | POA: Diagnosis not present

## 2015-12-23 DIAGNOSIS — Z9981 Dependence on supplemental oxygen: Secondary | ICD-10-CM | POA: Diagnosis not present

## 2015-12-23 DIAGNOSIS — Z87891 Personal history of nicotine dependence: Secondary | ICD-10-CM | POA: Diagnosis not present

## 2015-12-23 DIAGNOSIS — C3490 Malignant neoplasm of unspecified part of unspecified bronchus or lung: Secondary | ICD-10-CM | POA: Diagnosis not present

## 2015-12-23 DIAGNOSIS — E1122 Type 2 diabetes mellitus with diabetic chronic kidney disease: Secondary | ICD-10-CM | POA: Diagnosis not present

## 2015-12-23 DIAGNOSIS — L97911 Non-pressure chronic ulcer of unspecified part of right lower leg limited to breakdown of skin: Secondary | ICD-10-CM | POA: Diagnosis not present

## 2015-12-23 DIAGNOSIS — Z794 Long term (current) use of insulin: Secondary | ICD-10-CM | POA: Diagnosis not present

## 2015-12-23 DIAGNOSIS — I130 Hypertensive heart and chronic kidney disease with heart failure and stage 1 through stage 4 chronic kidney disease, or unspecified chronic kidney disease: Secondary | ICD-10-CM | POA: Diagnosis not present

## 2015-12-23 DIAGNOSIS — I872 Venous insufficiency (chronic) (peripheral): Secondary | ICD-10-CM | POA: Diagnosis not present

## 2015-12-23 DIAGNOSIS — I482 Chronic atrial fibrillation: Secondary | ICD-10-CM | POA: Diagnosis not present

## 2015-12-23 DIAGNOSIS — E1140 Type 2 diabetes mellitus with diabetic neuropathy, unspecified: Secondary | ICD-10-CM | POA: Diagnosis not present

## 2015-12-28 DIAGNOSIS — C3490 Malignant neoplasm of unspecified part of unspecified bronchus or lung: Secondary | ICD-10-CM | POA: Diagnosis not present

## 2015-12-28 DIAGNOSIS — I5032 Chronic diastolic (congestive) heart failure: Secondary | ICD-10-CM | POA: Diagnosis not present

## 2015-12-28 DIAGNOSIS — Z794 Long term (current) use of insulin: Secondary | ICD-10-CM | POA: Diagnosis not present

## 2015-12-28 DIAGNOSIS — Z87891 Personal history of nicotine dependence: Secondary | ICD-10-CM | POA: Diagnosis not present

## 2015-12-28 DIAGNOSIS — Z7901 Long term (current) use of anticoagulants: Secondary | ICD-10-CM | POA: Diagnosis not present

## 2015-12-28 DIAGNOSIS — I130 Hypertensive heart and chronic kidney disease with heart failure and stage 1 through stage 4 chronic kidney disease, or unspecified chronic kidney disease: Secondary | ICD-10-CM | POA: Diagnosis not present

## 2015-12-28 DIAGNOSIS — N189 Chronic kidney disease, unspecified: Secondary | ICD-10-CM | POA: Diagnosis not present

## 2015-12-28 DIAGNOSIS — L97921 Non-pressure chronic ulcer of unspecified part of left lower leg limited to breakdown of skin: Secondary | ICD-10-CM | POA: Diagnosis not present

## 2015-12-28 DIAGNOSIS — I482 Chronic atrial fibrillation: Secondary | ICD-10-CM | POA: Diagnosis not present

## 2015-12-28 DIAGNOSIS — E1140 Type 2 diabetes mellitus with diabetic neuropathy, unspecified: Secondary | ICD-10-CM | POA: Diagnosis not present

## 2015-12-28 DIAGNOSIS — L97911 Non-pressure chronic ulcer of unspecified part of right lower leg limited to breakdown of skin: Secondary | ICD-10-CM | POA: Diagnosis not present

## 2015-12-28 DIAGNOSIS — I872 Venous insufficiency (chronic) (peripheral): Secondary | ICD-10-CM | POA: Diagnosis not present

## 2015-12-28 DIAGNOSIS — E1122 Type 2 diabetes mellitus with diabetic chronic kidney disease: Secondary | ICD-10-CM | POA: Diagnosis not present

## 2015-12-28 DIAGNOSIS — Z9981 Dependence on supplemental oxygen: Secondary | ICD-10-CM | POA: Diagnosis not present

## 2015-12-28 DIAGNOSIS — J449 Chronic obstructive pulmonary disease, unspecified: Secondary | ICD-10-CM | POA: Diagnosis not present

## 2015-12-31 DIAGNOSIS — C3490 Malignant neoplasm of unspecified part of unspecified bronchus or lung: Secondary | ICD-10-CM | POA: Diagnosis not present

## 2015-12-31 DIAGNOSIS — I50810 Right heart failure, unspecified: Secondary | ICD-10-CM | POA: Diagnosis not present

## 2015-12-31 DIAGNOSIS — Z87891 Personal history of nicotine dependence: Secondary | ICD-10-CM | POA: Diagnosis not present

## 2015-12-31 DIAGNOSIS — Z7901 Long term (current) use of anticoagulants: Secondary | ICD-10-CM | POA: Diagnosis not present

## 2015-12-31 DIAGNOSIS — Z794 Long term (current) use of insulin: Secondary | ICD-10-CM | POA: Diagnosis not present

## 2015-12-31 DIAGNOSIS — Z9981 Dependence on supplemental oxygen: Secondary | ICD-10-CM | POA: Diagnosis not present

## 2015-12-31 DIAGNOSIS — I5032 Chronic diastolic (congestive) heart failure: Secondary | ICD-10-CM | POA: Diagnosis not present

## 2015-12-31 DIAGNOSIS — L97911 Non-pressure chronic ulcer of unspecified part of right lower leg limited to breakdown of skin: Secondary | ICD-10-CM | POA: Diagnosis not present

## 2015-12-31 DIAGNOSIS — I10 Essential (primary) hypertension: Secondary | ICD-10-CM | POA: Diagnosis not present

## 2015-12-31 DIAGNOSIS — E1122 Type 2 diabetes mellitus with diabetic chronic kidney disease: Secondary | ICD-10-CM | POA: Diagnosis not present

## 2015-12-31 DIAGNOSIS — E119 Type 2 diabetes mellitus without complications: Secondary | ICD-10-CM | POA: Diagnosis not present

## 2015-12-31 DIAGNOSIS — I482 Chronic atrial fibrillation: Secondary | ICD-10-CM | POA: Diagnosis not present

## 2015-12-31 DIAGNOSIS — E785 Hyperlipidemia, unspecified: Secondary | ICD-10-CM | POA: Diagnosis not present

## 2015-12-31 DIAGNOSIS — N189 Chronic kidney disease, unspecified: Secondary | ICD-10-CM | POA: Diagnosis not present

## 2015-12-31 DIAGNOSIS — I130 Hypertensive heart and chronic kidney disease with heart failure and stage 1 through stage 4 chronic kidney disease, or unspecified chronic kidney disease: Secondary | ICD-10-CM | POA: Diagnosis not present

## 2015-12-31 DIAGNOSIS — I872 Venous insufficiency (chronic) (peripheral): Secondary | ICD-10-CM | POA: Diagnosis not present

## 2015-12-31 DIAGNOSIS — L97921 Non-pressure chronic ulcer of unspecified part of left lower leg limited to breakdown of skin: Secondary | ICD-10-CM | POA: Diagnosis not present

## 2015-12-31 DIAGNOSIS — J449 Chronic obstructive pulmonary disease, unspecified: Secondary | ICD-10-CM | POA: Diagnosis not present

## 2015-12-31 DIAGNOSIS — E1140 Type 2 diabetes mellitus with diabetic neuropathy, unspecified: Secondary | ICD-10-CM | POA: Diagnosis not present

## 2016-01-04 DIAGNOSIS — J449 Chronic obstructive pulmonary disease, unspecified: Secondary | ICD-10-CM | POA: Diagnosis not present

## 2016-01-04 DIAGNOSIS — N189 Chronic kidney disease, unspecified: Secondary | ICD-10-CM | POA: Diagnosis not present

## 2016-01-04 DIAGNOSIS — E1140 Type 2 diabetes mellitus with diabetic neuropathy, unspecified: Secondary | ICD-10-CM | POA: Diagnosis not present

## 2016-01-04 DIAGNOSIS — I872 Venous insufficiency (chronic) (peripheral): Secondary | ICD-10-CM | POA: Diagnosis not present

## 2016-01-04 DIAGNOSIS — Z7901 Long term (current) use of anticoagulants: Secondary | ICD-10-CM | POA: Diagnosis not present

## 2016-01-04 DIAGNOSIS — Z9981 Dependence on supplemental oxygen: Secondary | ICD-10-CM | POA: Diagnosis not present

## 2016-01-04 DIAGNOSIS — I130 Hypertensive heart and chronic kidney disease with heart failure and stage 1 through stage 4 chronic kidney disease, or unspecified chronic kidney disease: Secondary | ICD-10-CM | POA: Diagnosis not present

## 2016-01-04 DIAGNOSIS — Z794 Long term (current) use of insulin: Secondary | ICD-10-CM | POA: Diagnosis not present

## 2016-01-04 DIAGNOSIS — E1122 Type 2 diabetes mellitus with diabetic chronic kidney disease: Secondary | ICD-10-CM | POA: Diagnosis not present

## 2016-01-04 DIAGNOSIS — L97911 Non-pressure chronic ulcer of unspecified part of right lower leg limited to breakdown of skin: Secondary | ICD-10-CM | POA: Diagnosis not present

## 2016-01-04 DIAGNOSIS — L97921 Non-pressure chronic ulcer of unspecified part of left lower leg limited to breakdown of skin: Secondary | ICD-10-CM | POA: Diagnosis not present

## 2016-01-04 DIAGNOSIS — C3490 Malignant neoplasm of unspecified part of unspecified bronchus or lung: Secondary | ICD-10-CM | POA: Diagnosis not present

## 2016-01-04 DIAGNOSIS — I5032 Chronic diastolic (congestive) heart failure: Secondary | ICD-10-CM | POA: Diagnosis not present

## 2016-01-04 DIAGNOSIS — Z87891 Personal history of nicotine dependence: Secondary | ICD-10-CM | POA: Diagnosis not present

## 2016-01-04 DIAGNOSIS — I482 Chronic atrial fibrillation: Secondary | ICD-10-CM | POA: Diagnosis not present

## 2016-01-07 DIAGNOSIS — E1140 Type 2 diabetes mellitus with diabetic neuropathy, unspecified: Secondary | ICD-10-CM | POA: Diagnosis not present

## 2016-01-07 DIAGNOSIS — C3490 Malignant neoplasm of unspecified part of unspecified bronchus or lung: Secondary | ICD-10-CM | POA: Diagnosis not present

## 2016-01-07 DIAGNOSIS — Z7901 Long term (current) use of anticoagulants: Secondary | ICD-10-CM | POA: Diagnosis not present

## 2016-01-07 DIAGNOSIS — E1122 Type 2 diabetes mellitus with diabetic chronic kidney disease: Secondary | ICD-10-CM | POA: Diagnosis not present

## 2016-01-07 DIAGNOSIS — I5032 Chronic diastolic (congestive) heart failure: Secondary | ICD-10-CM | POA: Diagnosis not present

## 2016-01-07 DIAGNOSIS — Z794 Long term (current) use of insulin: Secondary | ICD-10-CM | POA: Diagnosis not present

## 2016-01-07 DIAGNOSIS — I482 Chronic atrial fibrillation: Secondary | ICD-10-CM | POA: Diagnosis not present

## 2016-01-07 DIAGNOSIS — N189 Chronic kidney disease, unspecified: Secondary | ICD-10-CM | POA: Diagnosis not present

## 2016-01-07 DIAGNOSIS — L97911 Non-pressure chronic ulcer of unspecified part of right lower leg limited to breakdown of skin: Secondary | ICD-10-CM | POA: Diagnosis not present

## 2016-01-07 DIAGNOSIS — L97921 Non-pressure chronic ulcer of unspecified part of left lower leg limited to breakdown of skin: Secondary | ICD-10-CM | POA: Diagnosis not present

## 2016-01-07 DIAGNOSIS — I130 Hypertensive heart and chronic kidney disease with heart failure and stage 1 through stage 4 chronic kidney disease, or unspecified chronic kidney disease: Secondary | ICD-10-CM | POA: Diagnosis not present

## 2016-01-07 DIAGNOSIS — Z87891 Personal history of nicotine dependence: Secondary | ICD-10-CM | POA: Diagnosis not present

## 2016-01-07 DIAGNOSIS — I872 Venous insufficiency (chronic) (peripheral): Secondary | ICD-10-CM | POA: Diagnosis not present

## 2016-01-07 DIAGNOSIS — J449 Chronic obstructive pulmonary disease, unspecified: Secondary | ICD-10-CM | POA: Diagnosis not present

## 2016-01-07 DIAGNOSIS — Z9981 Dependence on supplemental oxygen: Secondary | ICD-10-CM | POA: Diagnosis not present

## 2016-01-11 DIAGNOSIS — Z794 Long term (current) use of insulin: Secondary | ICD-10-CM | POA: Diagnosis not present

## 2016-01-11 DIAGNOSIS — Z7901 Long term (current) use of anticoagulants: Secondary | ICD-10-CM | POA: Diagnosis not present

## 2016-01-11 DIAGNOSIS — N189 Chronic kidney disease, unspecified: Secondary | ICD-10-CM | POA: Diagnosis not present

## 2016-01-11 DIAGNOSIS — E1140 Type 2 diabetes mellitus with diabetic neuropathy, unspecified: Secondary | ICD-10-CM | POA: Diagnosis not present

## 2016-01-11 DIAGNOSIS — I872 Venous insufficiency (chronic) (peripheral): Secondary | ICD-10-CM | POA: Diagnosis not present

## 2016-01-11 DIAGNOSIS — C3490 Malignant neoplasm of unspecified part of unspecified bronchus or lung: Secondary | ICD-10-CM | POA: Diagnosis not present

## 2016-01-11 DIAGNOSIS — Z87891 Personal history of nicotine dependence: Secondary | ICD-10-CM | POA: Diagnosis not present

## 2016-01-11 DIAGNOSIS — I5032 Chronic diastolic (congestive) heart failure: Secondary | ICD-10-CM | POA: Diagnosis not present

## 2016-01-11 DIAGNOSIS — Z9981 Dependence on supplemental oxygen: Secondary | ICD-10-CM | POA: Diagnosis not present

## 2016-01-11 DIAGNOSIS — J449 Chronic obstructive pulmonary disease, unspecified: Secondary | ICD-10-CM | POA: Diagnosis not present

## 2016-01-11 DIAGNOSIS — L97921 Non-pressure chronic ulcer of unspecified part of left lower leg limited to breakdown of skin: Secondary | ICD-10-CM | POA: Diagnosis not present

## 2016-01-11 DIAGNOSIS — I130 Hypertensive heart and chronic kidney disease with heart failure and stage 1 through stage 4 chronic kidney disease, or unspecified chronic kidney disease: Secondary | ICD-10-CM | POA: Diagnosis not present

## 2016-01-11 DIAGNOSIS — I482 Chronic atrial fibrillation: Secondary | ICD-10-CM | POA: Diagnosis not present

## 2016-01-11 DIAGNOSIS — E1122 Type 2 diabetes mellitus with diabetic chronic kidney disease: Secondary | ICD-10-CM | POA: Diagnosis not present

## 2016-01-11 DIAGNOSIS — L97911 Non-pressure chronic ulcer of unspecified part of right lower leg limited to breakdown of skin: Secondary | ICD-10-CM | POA: Diagnosis not present

## 2016-01-14 DIAGNOSIS — Z794 Long term (current) use of insulin: Secondary | ICD-10-CM | POA: Diagnosis not present

## 2016-01-14 DIAGNOSIS — J449 Chronic obstructive pulmonary disease, unspecified: Secondary | ICD-10-CM | POA: Diagnosis not present

## 2016-01-14 DIAGNOSIS — E1122 Type 2 diabetes mellitus with diabetic chronic kidney disease: Secondary | ICD-10-CM | POA: Diagnosis not present

## 2016-01-14 DIAGNOSIS — L97911 Non-pressure chronic ulcer of unspecified part of right lower leg limited to breakdown of skin: Secondary | ICD-10-CM | POA: Diagnosis not present

## 2016-01-14 DIAGNOSIS — C3490 Malignant neoplasm of unspecified part of unspecified bronchus or lung: Secondary | ICD-10-CM | POA: Diagnosis not present

## 2016-01-14 DIAGNOSIS — L97921 Non-pressure chronic ulcer of unspecified part of left lower leg limited to breakdown of skin: Secondary | ICD-10-CM | POA: Diagnosis not present

## 2016-01-14 DIAGNOSIS — I130 Hypertensive heart and chronic kidney disease with heart failure and stage 1 through stage 4 chronic kidney disease, or unspecified chronic kidney disease: Secondary | ICD-10-CM | POA: Diagnosis not present

## 2016-01-14 DIAGNOSIS — E1140 Type 2 diabetes mellitus with diabetic neuropathy, unspecified: Secondary | ICD-10-CM | POA: Diagnosis not present

## 2016-01-14 DIAGNOSIS — I482 Chronic atrial fibrillation: Secondary | ICD-10-CM | POA: Diagnosis not present

## 2016-01-14 DIAGNOSIS — N189 Chronic kidney disease, unspecified: Secondary | ICD-10-CM | POA: Diagnosis not present

## 2016-01-14 DIAGNOSIS — I872 Venous insufficiency (chronic) (peripheral): Secondary | ICD-10-CM | POA: Diagnosis not present

## 2016-01-14 DIAGNOSIS — Z9981 Dependence on supplemental oxygen: Secondary | ICD-10-CM | POA: Diagnosis not present

## 2016-01-14 DIAGNOSIS — I5032 Chronic diastolic (congestive) heart failure: Secondary | ICD-10-CM | POA: Diagnosis not present

## 2016-01-14 DIAGNOSIS — Z7901 Long term (current) use of anticoagulants: Secondary | ICD-10-CM | POA: Diagnosis not present

## 2016-01-14 DIAGNOSIS — Z87891 Personal history of nicotine dependence: Secondary | ICD-10-CM | POA: Diagnosis not present

## 2016-01-18 DIAGNOSIS — Z87891 Personal history of nicotine dependence: Secondary | ICD-10-CM | POA: Diagnosis not present

## 2016-01-18 DIAGNOSIS — I872 Venous insufficiency (chronic) (peripheral): Secondary | ICD-10-CM | POA: Diagnosis not present

## 2016-01-18 DIAGNOSIS — I130 Hypertensive heart and chronic kidney disease with heart failure and stage 1 through stage 4 chronic kidney disease, or unspecified chronic kidney disease: Secondary | ICD-10-CM | POA: Diagnosis not present

## 2016-01-18 DIAGNOSIS — L97921 Non-pressure chronic ulcer of unspecified part of left lower leg limited to breakdown of skin: Secondary | ICD-10-CM | POA: Diagnosis not present

## 2016-01-18 DIAGNOSIS — E1122 Type 2 diabetes mellitus with diabetic chronic kidney disease: Secondary | ICD-10-CM | POA: Diagnosis not present

## 2016-01-18 DIAGNOSIS — Z794 Long term (current) use of insulin: Secondary | ICD-10-CM | POA: Diagnosis not present

## 2016-01-18 DIAGNOSIS — E1140 Type 2 diabetes mellitus with diabetic neuropathy, unspecified: Secondary | ICD-10-CM | POA: Diagnosis not present

## 2016-01-18 DIAGNOSIS — C3490 Malignant neoplasm of unspecified part of unspecified bronchus or lung: Secondary | ICD-10-CM | POA: Diagnosis not present

## 2016-01-18 DIAGNOSIS — J449 Chronic obstructive pulmonary disease, unspecified: Secondary | ICD-10-CM | POA: Diagnosis not present

## 2016-01-18 DIAGNOSIS — Z7901 Long term (current) use of anticoagulants: Secondary | ICD-10-CM | POA: Diagnosis not present

## 2016-01-18 DIAGNOSIS — Z9981 Dependence on supplemental oxygen: Secondary | ICD-10-CM | POA: Diagnosis not present

## 2016-01-18 DIAGNOSIS — I482 Chronic atrial fibrillation: Secondary | ICD-10-CM | POA: Diagnosis not present

## 2016-01-18 DIAGNOSIS — L97911 Non-pressure chronic ulcer of unspecified part of right lower leg limited to breakdown of skin: Secondary | ICD-10-CM | POA: Diagnosis not present

## 2016-01-18 DIAGNOSIS — N189 Chronic kidney disease, unspecified: Secondary | ICD-10-CM | POA: Diagnosis not present

## 2016-01-18 DIAGNOSIS — I5032 Chronic diastolic (congestive) heart failure: Secondary | ICD-10-CM | POA: Diagnosis not present

## 2016-01-19 ENCOUNTER — Other Ambulatory Visit: Payer: Self-pay

## 2016-01-19 DIAGNOSIS — I5032 Chronic diastolic (congestive) heart failure: Secondary | ICD-10-CM | POA: Diagnosis not present

## 2016-01-19 DIAGNOSIS — I872 Venous insufficiency (chronic) (peripheral): Secondary | ICD-10-CM | POA: Diagnosis not present

## 2016-01-19 DIAGNOSIS — L97921 Non-pressure chronic ulcer of unspecified part of left lower leg limited to breakdown of skin: Secondary | ICD-10-CM | POA: Diagnosis not present

## 2016-01-19 DIAGNOSIS — Z7901 Long term (current) use of anticoagulants: Secondary | ICD-10-CM | POA: Diagnosis not present

## 2016-01-19 DIAGNOSIS — C3490 Malignant neoplasm of unspecified part of unspecified bronchus or lung: Secondary | ICD-10-CM | POA: Diagnosis not present

## 2016-01-19 DIAGNOSIS — E1140 Type 2 diabetes mellitus with diabetic neuropathy, unspecified: Secondary | ICD-10-CM | POA: Diagnosis not present

## 2016-01-19 DIAGNOSIS — Z87891 Personal history of nicotine dependence: Secondary | ICD-10-CM | POA: Diagnosis not present

## 2016-01-19 DIAGNOSIS — E1122 Type 2 diabetes mellitus with diabetic chronic kidney disease: Secondary | ICD-10-CM | POA: Diagnosis not present

## 2016-01-19 DIAGNOSIS — I482 Chronic atrial fibrillation: Secondary | ICD-10-CM | POA: Diagnosis not present

## 2016-01-19 DIAGNOSIS — L97911 Non-pressure chronic ulcer of unspecified part of right lower leg limited to breakdown of skin: Secondary | ICD-10-CM | POA: Diagnosis not present

## 2016-01-19 DIAGNOSIS — Z9981 Dependence on supplemental oxygen: Secondary | ICD-10-CM | POA: Diagnosis not present

## 2016-01-19 DIAGNOSIS — J449 Chronic obstructive pulmonary disease, unspecified: Secondary | ICD-10-CM | POA: Diagnosis not present

## 2016-01-19 DIAGNOSIS — N189 Chronic kidney disease, unspecified: Secondary | ICD-10-CM | POA: Diagnosis not present

## 2016-01-19 DIAGNOSIS — I130 Hypertensive heart and chronic kidney disease with heart failure and stage 1 through stage 4 chronic kidney disease, or unspecified chronic kidney disease: Secondary | ICD-10-CM | POA: Diagnosis not present

## 2016-01-19 DIAGNOSIS — Z794 Long term (current) use of insulin: Secondary | ICD-10-CM | POA: Diagnosis not present

## 2016-01-19 NOTE — Patient Outreach (Signed)
[facility]) Care Management  01/19/2016  Anne Sanders 11-24-1945 [id number]  REFERRAL DATE:  01/12/16 REFERRAL SOURCE: EMMI patient engagement REFERRAL REASON: [facility] care management services.  SUBJECTIVE:  Telephone call to patient regarding EMMI patient engagement referral. HIPAA verified with patient. Discussed and offered [facility] care management services.  Patient states she is managing her health conditions. Patient states she has two daughters that live close to her.  Patient states her daughters give her  Her medications and takes her to her doctors appointments.  Patient states her daughters get her groceries for her.  Patient states she wears oxygen around the clock at 2 L.  Patient states her primary doctor, Dr. Micheal Likens manages her health condition. Patient states she had lung cancer and has been treated.  Patient states she is in recovery. Patient refused [facility] care management services. Patient verbally agreed to receive [facility] care management outreach letter and brochure. Patient states she will discuss services with her daughters.   PLAN; RNCM will refer patient to case manager assistant to close due to refusal of services.  RNCM will send patient [facility] care management outreach letter and brochure.  RNCM will notify patient primary MD of refusal of services.   Quinn Plowman RN,BSN,CCM [facility] Telephonic  [phone]

## 2016-01-30 DIAGNOSIS — J449 Chronic obstructive pulmonary disease, unspecified: Secondary | ICD-10-CM | POA: Diagnosis not present

## 2016-01-31 DIAGNOSIS — D649 Anemia, unspecified: Secondary | ICD-10-CM | POA: Diagnosis not present

## 2016-01-31 DIAGNOSIS — N183 Chronic kidney disease, stage 3 (moderate): Secondary | ICD-10-CM | POA: Diagnosis not present

## 2016-01-31 DIAGNOSIS — J449 Chronic obstructive pulmonary disease, unspecified: Secondary | ICD-10-CM | POA: Diagnosis not present

## 2016-01-31 DIAGNOSIS — Z79899 Other long term (current) drug therapy: Secondary | ICD-10-CM | POA: Diagnosis not present

## 2016-01-31 DIAGNOSIS — R413 Other amnesia: Secondary | ICD-10-CM | POA: Diagnosis not present

## 2016-01-31 DIAGNOSIS — M109 Gout, unspecified: Secondary | ICD-10-CM | POA: Diagnosis not present

## 2016-02-03 DIAGNOSIS — N183 Chronic kidney disease, stage 3 (moderate): Secondary | ICD-10-CM | POA: Diagnosis not present

## 2016-02-03 DIAGNOSIS — I82409 Acute embolism and thrombosis of unspecified deep veins of unspecified lower extremity: Secondary | ICD-10-CM | POA: Diagnosis not present

## 2016-02-03 DIAGNOSIS — D649 Anemia, unspecified: Secondary | ICD-10-CM | POA: Diagnosis not present

## 2016-02-03 DIAGNOSIS — I50810 Right heart failure, unspecified: Secondary | ICD-10-CM | POA: Diagnosis not present

## 2016-02-22 DIAGNOSIS — R413 Other amnesia: Secondary | ICD-10-CM | POA: Diagnosis not present

## 2016-02-22 DIAGNOSIS — N189 Chronic kidney disease, unspecified: Secondary | ICD-10-CM | POA: Diagnosis not present

## 2016-02-22 DIAGNOSIS — D649 Anemia, unspecified: Secondary | ICD-10-CM | POA: Diagnosis not present

## 2016-02-22 DIAGNOSIS — E872 Acidosis: Secondary | ICD-10-CM | POA: Diagnosis not present

## 2016-02-25 DIAGNOSIS — R410 Disorientation, unspecified: Secondary | ICD-10-CM | POA: Diagnosis not present

## 2016-02-25 DIAGNOSIS — R05 Cough: Secondary | ICD-10-CM | POA: Diagnosis not present

## 2016-02-25 DIAGNOSIS — C3490 Malignant neoplasm of unspecified part of unspecified bronchus or lung: Secondary | ICD-10-CM | POA: Diagnosis not present

## 2016-03-01 DIAGNOSIS — J449 Chronic obstructive pulmonary disease, unspecified: Secondary | ICD-10-CM | POA: Diagnosis not present

## 2016-03-02 DIAGNOSIS — I5032 Chronic diastolic (congestive) heart failure: Secondary | ICD-10-CM | POA: Diagnosis not present

## 2016-03-02 DIAGNOSIS — I482 Chronic atrial fibrillation: Secondary | ICD-10-CM | POA: Diagnosis not present

## 2016-03-02 DIAGNOSIS — Z794 Long term (current) use of insulin: Secondary | ICD-10-CM | POA: Diagnosis not present

## 2016-03-02 DIAGNOSIS — C3490 Malignant neoplasm of unspecified part of unspecified bronchus or lung: Secondary | ICD-10-CM | POA: Diagnosis not present

## 2016-03-02 DIAGNOSIS — Z7901 Long term (current) use of anticoagulants: Secondary | ICD-10-CM | POA: Diagnosis not present

## 2016-03-02 DIAGNOSIS — J449 Chronic obstructive pulmonary disease, unspecified: Secondary | ICD-10-CM | POA: Diagnosis not present

## 2016-03-02 DIAGNOSIS — Z9981 Dependence on supplemental oxygen: Secondary | ICD-10-CM | POA: Diagnosis not present

## 2016-03-02 DIAGNOSIS — I83012 Varicose veins of right lower extremity with ulcer of calf: Secondary | ICD-10-CM | POA: Diagnosis not present

## 2016-03-02 DIAGNOSIS — E1140 Type 2 diabetes mellitus with diabetic neuropathy, unspecified: Secondary | ICD-10-CM | POA: Diagnosis not present

## 2016-03-02 DIAGNOSIS — I872 Venous insufficiency (chronic) (peripheral): Secondary | ICD-10-CM | POA: Diagnosis not present

## 2016-03-02 DIAGNOSIS — L97911 Non-pressure chronic ulcer of unspecified part of right lower leg limited to breakdown of skin: Secondary | ICD-10-CM | POA: Diagnosis not present

## 2016-03-02 DIAGNOSIS — I130 Hypertensive heart and chronic kidney disease with heart failure and stage 1 through stage 4 chronic kidney disease, or unspecified chronic kidney disease: Secondary | ICD-10-CM | POA: Diagnosis not present

## 2016-03-02 DIAGNOSIS — Z87891 Personal history of nicotine dependence: Secondary | ICD-10-CM | POA: Diagnosis not present

## 2016-03-02 DIAGNOSIS — E1122 Type 2 diabetes mellitus with diabetic chronic kidney disease: Secondary | ICD-10-CM | POA: Diagnosis not present

## 2016-03-07 ENCOUNTER — Telehealth: Payer: Self-pay | Admitting: *Deleted

## 2016-03-07 DIAGNOSIS — R413 Other amnesia: Secondary | ICD-10-CM | POA: Diagnosis not present

## 2016-03-07 DIAGNOSIS — D649 Anemia, unspecified: Secondary | ICD-10-CM | POA: Diagnosis not present

## 2016-03-07 DIAGNOSIS — B49 Unspecified mycosis: Secondary | ICD-10-CM | POA: Diagnosis not present

## 2016-03-07 DIAGNOSIS — E119 Type 2 diabetes mellitus without complications: Secondary | ICD-10-CM | POA: Diagnosis not present

## 2016-03-07 DIAGNOSIS — N189 Chronic kidney disease, unspecified: Secondary | ICD-10-CM | POA: Diagnosis not present

## 2016-03-07 NOTE — Telephone Encounter (Signed)
CALLED PATIENT TO INFORM OF LAB AND CT FOR 03-14-16 - LAB @ 1:15 PM AND HER CT ON 03-14-16 - ARRIVAL TIME - 2:15 PM , PT. TO BE NPO - 4 HRS.PRIOR TO TEST  @ [facility] AND HER FU WITH ALISON PERKINS ON 03-15-16 FOR RESULTS, PT. REQUESTED THAT I CALL HER Friday JAN. [place]

## 2016-03-09 ENCOUNTER — Encounter: Payer: Self-pay | Admitting: Radiation Oncology

## 2016-03-09 DIAGNOSIS — Z9981 Dependence on supplemental oxygen: Secondary | ICD-10-CM | POA: Diagnosis not present

## 2016-03-09 DIAGNOSIS — I5032 Chronic diastolic (congestive) heart failure: Secondary | ICD-10-CM | POA: Diagnosis not present

## 2016-03-09 DIAGNOSIS — L97911 Non-pressure chronic ulcer of unspecified part of right lower leg limited to breakdown of skin: Secondary | ICD-10-CM | POA: Diagnosis not present

## 2016-03-09 DIAGNOSIS — Z7901 Long term (current) use of anticoagulants: Secondary | ICD-10-CM | POA: Diagnosis not present

## 2016-03-09 DIAGNOSIS — I83012 Varicose veins of right lower extremity with ulcer of calf: Secondary | ICD-10-CM | POA: Diagnosis not present

## 2016-03-09 DIAGNOSIS — Z794 Long term (current) use of insulin: Secondary | ICD-10-CM | POA: Diagnosis not present

## 2016-03-09 DIAGNOSIS — I482 Chronic atrial fibrillation: Secondary | ICD-10-CM | POA: Diagnosis not present

## 2016-03-09 DIAGNOSIS — E1122 Type 2 diabetes mellitus with diabetic chronic kidney disease: Secondary | ICD-10-CM | POA: Diagnosis not present

## 2016-03-09 DIAGNOSIS — I872 Venous insufficiency (chronic) (peripheral): Secondary | ICD-10-CM | POA: Diagnosis not present

## 2016-03-09 DIAGNOSIS — C3490 Malignant neoplasm of unspecified part of unspecified bronchus or lung: Secondary | ICD-10-CM | POA: Diagnosis not present

## 2016-03-09 DIAGNOSIS — Z87891 Personal history of nicotine dependence: Secondary | ICD-10-CM | POA: Diagnosis not present

## 2016-03-09 DIAGNOSIS — E1140 Type 2 diabetes mellitus with diabetic neuropathy, unspecified: Secondary | ICD-10-CM | POA: Diagnosis not present

## 2016-03-09 DIAGNOSIS — I130 Hypertensive heart and chronic kidney disease with heart failure and stage 1 through stage 4 chronic kidney disease, or unspecified chronic kidney disease: Secondary | ICD-10-CM | POA: Diagnosis not present

## 2016-03-09 DIAGNOSIS — J449 Chronic obstructive pulmonary disease, unspecified: Secondary | ICD-10-CM | POA: Diagnosis not present

## 2016-03-14 ENCOUNTER — Telehealth: Payer: Self-pay | Admitting: *Deleted

## 2016-03-14 ENCOUNTER — Ambulatory Visit (HOSPITAL_COMMUNITY): Admission: RE | Admit: 2016-03-14 | Payer: Medicare Other | Source: Ambulatory Visit

## 2016-03-14 ENCOUNTER — Ambulatory Visit: Payer: Medicare Other

## 2016-03-14 NOTE — Telephone Encounter (Signed)
CALLED PATIENT TO INFORM OF LAB AND CT FOR 03-16-16 AND FU ON 03-20-16 WITH ALISON PERKINS, SPOKE WITH PATIENT AND SHE IS AWARE OF THESE APPTS.

## 2016-03-15 ENCOUNTER — Ambulatory Visit
Admission: RE | Admit: 2016-03-15 | Discharge: 2016-03-15 | Disposition: A | Discharge status: Home / Self Care | Payer: Medicare Other | Source: Ambulatory Visit | Attending: Radiation Oncology | Admitting: Radiation Oncology

## 2016-03-16 ENCOUNTER — Ambulatory Visit: Payer: Medicare Other

## 2016-03-16 ENCOUNTER — Ambulatory Visit (HOSPITAL_COMMUNITY): Admission: RE | Admit: 2016-03-16 | Payer: Medicare Other | Source: Ambulatory Visit

## 2016-03-16 ENCOUNTER — Telehealth: Payer: Self-pay | Admitting: *Deleted

## 2016-03-16 DIAGNOSIS — Z7901 Long term (current) use of anticoagulants: Secondary | ICD-10-CM | POA: Diagnosis not present

## 2016-03-16 DIAGNOSIS — Z9981 Dependence on supplemental oxygen: Secondary | ICD-10-CM | POA: Diagnosis not present

## 2016-03-16 DIAGNOSIS — I5032 Chronic diastolic (congestive) heart failure: Secondary | ICD-10-CM | POA: Diagnosis not present

## 2016-03-16 DIAGNOSIS — I130 Hypertensive heart and chronic kidney disease with heart failure and stage 1 through stage 4 chronic kidney disease, or unspecified chronic kidney disease: Secondary | ICD-10-CM | POA: Diagnosis not present

## 2016-03-16 DIAGNOSIS — Z87891 Personal history of nicotine dependence: Secondary | ICD-10-CM | POA: Diagnosis not present

## 2016-03-16 DIAGNOSIS — I872 Venous insufficiency (chronic) (peripheral): Secondary | ICD-10-CM | POA: Diagnosis not present

## 2016-03-16 DIAGNOSIS — I83012 Varicose veins of right lower extremity with ulcer of calf: Secondary | ICD-10-CM | POA: Diagnosis not present

## 2016-03-16 DIAGNOSIS — E1122 Type 2 diabetes mellitus with diabetic chronic kidney disease: Secondary | ICD-10-CM | POA: Diagnosis not present

## 2016-03-16 DIAGNOSIS — Z794 Long term (current) use of insulin: Secondary | ICD-10-CM | POA: Diagnosis not present

## 2016-03-16 DIAGNOSIS — E1140 Type 2 diabetes mellitus with diabetic neuropathy, unspecified: Secondary | ICD-10-CM | POA: Diagnosis not present

## 2016-03-16 DIAGNOSIS — J449 Chronic obstructive pulmonary disease, unspecified: Secondary | ICD-10-CM | POA: Diagnosis not present

## 2016-03-16 DIAGNOSIS — C3490 Malignant neoplasm of unspecified part of unspecified bronchus or lung: Secondary | ICD-10-CM | POA: Diagnosis not present

## 2016-03-16 DIAGNOSIS — L97911 Non-pressure chronic ulcer of unspecified part of right lower leg limited to breakdown of skin: Secondary | ICD-10-CM | POA: Diagnosis not present

## 2016-03-16 DIAGNOSIS — I482 Chronic atrial fibrillation: Secondary | ICD-10-CM | POA: Diagnosis not present

## 2016-03-16 NOTE — Telephone Encounter (Signed)
CALLED PATIENT TO INFORM THAT LAB AND FU VISIT HAVE BEEN MOVED SINCE SHE MOVED HER CT, LVM FOR A RETURN CALL

## 2016-03-20 ENCOUNTER — Ambulatory Visit: Admission: RE | Admit: 2016-03-20 | Payer: Medicare Other | Source: Ambulatory Visit | Admitting: Radiation Oncology

## 2016-03-22 ENCOUNTER — Ambulatory Visit
Admission: RE | Admit: 2016-03-22 | Discharge: 2016-03-22 | Disposition: A | Discharge status: Home / Self Care | Payer: Medicare Other | Source: Ambulatory Visit | Attending: Radiation Oncology | Admitting: Radiation Oncology

## 2016-03-22 ENCOUNTER — Ambulatory Visit (HOSPITAL_COMMUNITY)
Admission: RE | Admit: 2016-03-22 | Discharge: 2016-03-22 | Disposition: A | Discharge status: Home / Self Care | Payer: Medicare Other | Source: Ambulatory Visit | Attending: Radiation Oncology | Admitting: Radiation Oncology

## 2016-03-22 DIAGNOSIS — C3412 Malignant neoplasm of upper lobe, left bronchus or lung: Secondary | ICD-10-CM | POA: Diagnosis not present

## 2016-03-22 DIAGNOSIS — R911 Solitary pulmonary nodule: Secondary | ICD-10-CM | POA: Diagnosis not present

## 2016-03-22 LAB — BASIC METABOLIC PANEL
Anion Gap: 8 mEq/L (ref 3–11)
BUN: 30 mg/dL — AB (ref 7.0–26.0)
CHLORIDE: 100 meq/L (ref 98–109)
CO2: 32 meq/L — AB (ref 22–29)
CREATININE: 1.5 mg/dL — AB (ref 0.6–1.1)
Calcium: 9.5 mg/dL (ref 8.4–10.4)
EGFR: 36 mL/min/{1.73_m2} — ABNORMAL LOW (ref >90)
Glucose: 239 mg/dl — ABNORMAL HIGH (ref 70–140)
Potassium: 4.2 mEq/L (ref 3.5–5.1)
Sodium: 141 mEq/L (ref 136–145)

## 2016-03-22 MED ORDER — SODIUM CHLORIDE 0.9 % IJ SOLN
INTRAMUSCULAR | Status: AC
  Filled 2016-03-22: qty 50

## 2016-03-22 MED ORDER — IOPAMIDOL (ISOVUE-300) INJECTION 61%
INTRAVENOUS | Status: AC
  Filled 2016-03-22: qty 75

## 2016-03-22 MED ORDER — IOPAMIDOL (ISOVUE-300) INJECTION 61%
75.0000 mL | Freq: Once | INTRAVENOUS | Status: AC | PRN
  Administered 2016-03-22: 50 mL via INTRAVENOUS

## 2016-03-23 ENCOUNTER — Encounter: Payer: Self-pay | Admitting: Radiation Oncology

## 2016-03-23 ENCOUNTER — Ambulatory Visit
Admission: RE | Admit: 2016-03-23 | Discharge: 2016-03-23 | Disposition: A | Discharge status: Home / Self Care | Payer: Medicare Other | Source: Ambulatory Visit | Attending: Radiation Oncology | Admitting: Radiation Oncology

## 2016-03-23 VITALS — BP 142/73 | HR 64 | Temp 98.0°F | Resp 18 | Ht 63.0 in | Wt 177.6 lb

## 2016-03-23 DIAGNOSIS — E1122 Type 2 diabetes mellitus with diabetic chronic kidney disease: Secondary | ICD-10-CM | POA: Diagnosis not present

## 2016-03-23 DIAGNOSIS — L97911 Non-pressure chronic ulcer of unspecified part of right lower leg limited to breakdown of skin: Secondary | ICD-10-CM | POA: Diagnosis not present

## 2016-03-23 DIAGNOSIS — Z5189 Encounter for other specified aftercare: Secondary | ICD-10-CM | POA: Insufficient documentation

## 2016-03-23 DIAGNOSIS — Z7901 Long term (current) use of anticoagulants: Secondary | ICD-10-CM | POA: Insufficient documentation

## 2016-03-23 DIAGNOSIS — Z9889 Other specified postprocedural states: Secondary | ICD-10-CM | POA: Insufficient documentation

## 2016-03-23 DIAGNOSIS — Z9981 Dependence on supplemental oxygen: Secondary | ICD-10-CM | POA: Diagnosis not present

## 2016-03-23 DIAGNOSIS — I482 Chronic atrial fibrillation: Secondary | ICD-10-CM | POA: Diagnosis not present

## 2016-03-23 DIAGNOSIS — Z794 Long term (current) use of insulin: Secondary | ICD-10-CM | POA: Diagnosis not present

## 2016-03-23 DIAGNOSIS — I872 Venous insufficiency (chronic) (peripheral): Secondary | ICD-10-CM | POA: Diagnosis not present

## 2016-03-23 DIAGNOSIS — Z79899 Other long term (current) drug therapy: Secondary | ICD-10-CM | POA: Insufficient documentation

## 2016-03-23 DIAGNOSIS — C3412 Malignant neoplasm of upper lobe, left bronchus or lung: Secondary | ICD-10-CM | POA: Insufficient documentation

## 2016-03-23 DIAGNOSIS — I130 Hypertensive heart and chronic kidney disease with heart failure and stage 1 through stage 4 chronic kidney disease, or unspecified chronic kidney disease: Secondary | ICD-10-CM | POA: Diagnosis not present

## 2016-03-23 DIAGNOSIS — Z923 Personal history of irradiation: Secondary | ICD-10-CM | POA: Diagnosis not present

## 2016-03-23 DIAGNOSIS — Z8709 Personal history of other diseases of the respiratory system: Secondary | ICD-10-CM | POA: Diagnosis not present

## 2016-03-23 DIAGNOSIS — Z7984 Long term (current) use of oral hypoglycemic drugs: Secondary | ICD-10-CM | POA: Insufficient documentation

## 2016-03-23 DIAGNOSIS — J449 Chronic obstructive pulmonary disease, unspecified: Secondary | ICD-10-CM | POA: Diagnosis not present

## 2016-03-23 DIAGNOSIS — Z87891 Personal history of nicotine dependence: Secondary | ICD-10-CM | POA: Insufficient documentation

## 2016-03-23 DIAGNOSIS — I5032 Chronic diastolic (congestive) heart failure: Secondary | ICD-10-CM | POA: Diagnosis not present

## 2016-03-23 DIAGNOSIS — I83012 Varicose veins of right lower extremity with ulcer of calf: Secondary | ICD-10-CM | POA: Diagnosis not present

## 2016-03-23 DIAGNOSIS — C3490 Malignant neoplasm of unspecified part of unspecified bronchus or lung: Secondary | ICD-10-CM | POA: Diagnosis not present

## 2016-03-23 DIAGNOSIS — C3411 Malignant neoplasm of upper lobe, right bronchus or lung: Secondary | ICD-10-CM | POA: Diagnosis not present

## 2016-03-23 DIAGNOSIS — Z08 Encounter for follow-up examination after completed treatment for malignant neoplasm: Secondary | ICD-10-CM | POA: Diagnosis not present

## 2016-03-23 DIAGNOSIS — E1140 Type 2 diabetes mellitus with diabetic neuropathy, unspecified: Secondary | ICD-10-CM | POA: Diagnosis not present

## 2016-03-23 NOTE — Progress Notes (Signed)
Anne Sanders 71 y.o. 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                                                      .  Weight changes, if any: Wt Readings from Last 3 Encounters:  03/23/16 177 lb 9.6 oz (80.6 kg)  11/23/15 184 lb 12.8 oz (83.8 kg)  08/19/15 183 lb 12.8 oz (83.4 kg)   Respiratory complaints, if any: SOB O2 at 2 L/min continuous,coughing clear secretions at times Hemoptysis, if any:  None Swallowing Problems/Pain/Difficulty swallowing:Denies any swallowing problems eating or drinking. Smoking Tobacco/Marijuana/Snuff/ETOH use:Former smoker x 30 years 1.5 p/d quit 03-17-12 no alcohol or drug usage Appetite :Good eats three meals a day insulins have been stopped for now to see if her eating continues to improve. Pain: None When is next chemo scheduled?:No Lab work from of chart:None Imaging: 03-14-16 CT chest w contrast  BP (!) 142/73   Pulse 64   Temp 98 F (36.7 C) (Oral)   Resp 18   Ht '5\' 3"'$  (1.6 m)   Wt 177 lb 9.6 oz (80.6 kg)   SpO2 95%   BMI 31.46 kg/m

## 2016-03-24 ENCOUNTER — Encounter: Payer: Self-pay | Admitting: *Deleted

## 2016-03-24 NOTE — Progress Notes (Signed)
30 Anne Sanders daughter Anne Sanders called back for discussion after speaking with her sister Anne Sanders because she gives her mother's medications and just wanted to here the information.  Explained everything her the same as the message left for Anne Sanders.  Recent lab results showed an elevated glucose of 239 and Anne Simpson, PA-C for Dr. Lisbeth Renshaw wants the primary care doctor to be aware of the results in case he may want to adjust her diabetic medication;to increase her fluid intake and not take medications that are non steroid anti-inflammatory.  The daughter Anne Sanders said she and her sister  Anne Sanders had an understanding of what they needed to report to the primary care doctor.

## 2016-03-24 NOTE — Progress Notes (Signed)
Anne Sanders left a message for daughter [name] about recent lab work results.  I let [name] know that my name was Jarrett Ables nurse that works with  [name], PA-C who works with Drs. Moody and [place].   I told her that [name], PA-C wants her mother to increase her fluid intake, not take non steroid anti-inflammatory drugs and see her primary care doctor about her glucose level.  Left a call back  contact telephone number for Radiation oncology [phone].

## 2016-03-24 NOTE — Progress Notes (Signed)
[phone] called left message  for daughter only telephone number listed see progress note 03-24-16

## 2016-03-24 NOTE — Progress Notes (Signed)
0930 Anne Sanders daughter called back after speaking with her sister [name] in reference to the message left earlier about recent glucose that was elevated at 239 and report the results to the primary car doctor, the instruction to have their mother increase her fluid intake and do not take any non steroid anti-inflammatory drugs.  After speaking with [name] and explaining everything to her she said she was clear on what she and [name] needed to report to the primary care doctor.

## 2016-03-24 NOTE — Progress Notes (Signed)
[place] left a message for daughter [name] about recent lab work results.  I let [name] know that my name was Jarrett Ables nurse that works with  [name], PA-C who works with Drs. Moody and [place].   I told her that [name], PA-C wants her mother to increase her fluid intake, not take non steroid anti-inflammatory drugs and see her primary care doctor about her glucose level being elevated at 239.  Left a call back  contact telephone number for Radiation oncology [phone].

## 2016-03-26 NOTE — Progress Notes (Signed)
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   Interval Since Last Radiation: 12 months   03/21/2014 through 03/29/2015: The tumor in the left upper lobe was treated with a course of stereotactic body radiation treatment. The patient received 54 Gy In 3 fractions at 18 G per fraction.  09/02/2014 through 09/09/2014: The tumor in the right upper lobe was treated with a course of stereotactic body radiation treatment. The patient received 54 Gy In 3 fractions at 18 G per fraction.  04/14/2013 through 04/21/2013: The patient was treated to the lesion within the left upper lobe. She received a course of stereotactic body radiation treatment using to customized fields. This delivered 54 gray in 3 fractions.  Narrative:  The patient returns today for routine follow-up and CT scan report of the chest.  As above, she has been treated on three occasions with SBRT for stage I lung cancer. Her most recent treatment was between February and March 2017. Her imaging studies prior to her radiation revealed a change in the previously treated lesion in the left upper lobe of the lung, but this was not hypermetabolic. There was hypermetabolism in the separation lesion in the same lobe which she was treated for with her most recent SBRT treatment. Her right upper lobe nodule was stable. She proceeded with radiotherapy to the newest left upper lobe lesion.   Her scans of recent have been reviewed with Dr. Lisbeth Renshaw and have not shown a significant change to consider intervening with additional treatment, rather she's been followed. She had a CT on 03/23/16 revealing stability in bilateral upper lobes of the lungs, and increased ill defined  rounded opacity in peripheral left mid lung, and an 11 mm nodule in the posterior left upper lobe. This has been felt to represent post radiation change.   On review of systems, the patient reports that she is doing well overall. She continues to wear O2 and repors this is stable. She continues to cough clear secretions intermittently. She denies any chest pain, shortness of breath, fevers, chills, night sweats, unintended weight changes. She denies any bowel or bladder disturbances, and denies abdominal pain, nausea or vomiting. She denies any new musculoskeletal or joint aches or pains, new skin lesions or concerns. A complete review of systems is obtained and is otherwise negative.  Past Medical History:  Past Medical History:  Diagnosis Date  . A-fib ([place])    03/20/13; patient was unaware of diagnosis, but hx noted on her 01/22/13 PCP records (Dr. Micheal Likens); saw cardiologist Dr. Geraldo Pitter 08/2011 for afib in the setting of sepsis at [facility]  . Anemia   . Anxiety   . CHF (congestive heart failure) ([place])   . Chronic rhinitis   . COPD (chronic obstructive pulmonary disease) ([facility])    uses 1L of O2 all the time  . Diabetes mellitus without complication ([place])   . DVT (deep vein thrombosis) in pregnancy ([place])   . GERD (gastroesophageal reflux disease)   . History of radiation therapy 04/14/13-04/21/13   lul lung/54Gy/84f sbrt  . History of radiation therapy 09/09/14 completed   SBRT 3/3 lul lung  . Hyperlipidemia   . Hypertension   . Lung nodules   .  Pneumonia   . Pulmonary embolism ([place])   . Shortness of breath    with exertion  . Sleep apnea with hypersomnolence   . Umbilical hernia     Past Surgical History: Past Surgical History:  Procedure Laterality Date  . BRONCHOSCOPY  11/11/12   negative pathology of right kung mass...Dr. Alcide Clever  . transbronschial biopsy  11/11/12   right lung mass...neg path..Dr Alcide Clever  . VAGINAL HYSTERECTOMY     still has both ovaries and tubes  . VIDEO  BRONCHOSCOPY WITH ENDOBRONCHIAL NAVIGATION N/A 03/25/2013   Procedure: VIDEO BRONCHOSCOPY WITH ENDOBRONCHIAL NAVIGATION;  Surgeon: Grace Isaac, MD;  Location: [facility];  Service: Thoracic;  Laterality: N/A;  . VIDEO BRONCHOSCOPY WITH ENDOBRONCHIAL ULTRASOUND N/A 03/25/2013   Procedure: VIDEO BRONCHOSCOPY WITH ENDOBRONCHIAL ULTRASOUND;  Surgeon: Grace Isaac, MD;  Location: [facility];  Service: Thoracic;  Laterality: N/A;    Social History:  Social History   Social History  . Marital status: Widowed    Spouse name: N/A  . Number of children: N/A  . Years of education: N/A   Occupational History  . Not on file.   Social History Main Topics  . Smoking status: Former Smoker    Packs/day: 1.50    Years: 30.00    Types: Cigarettes    Start date: 03/17/1961    Quit date: 03/17/2012  . Smokeless tobacco: Former Systems developer    Types: Snuff     Comment: HX OF EXPOSURE TO [place]  . Alcohol use No  . Drug use: No  . Sexual activity: Not on file   Other Topics Concern  . Not on file   Social History Narrative  . No narrative on file  The patient is widowed and accompanied by her daughter [name].   Family History: Family History  Problem Relation Age of Onset  . Heart disease Mother      ALLERGIES:  has No Known Allergies.  Meds: Current Outpatient Prescriptions  Medication Sig Dispense Refill  . budesonide (PULMICORT) 0.5 MG/2ML nebulizer solution Take 0.5 mg by nebulization daily as needed (for wheezing).     Marland Kitchen buPROPion (WELLBUTRIN SR) 150 MG 12 hr tablet Take 150 mg by mouth 2 (two) times daily.    . digoxin (LANOXIN) 0.125 MG tablet Take 0.125 mg by mouth 2 (two) times daily.     . furosemide (LASIX) 80 MG tablet Take 80 mg by mouth 2 (two) times daily. 3 tabs '40mg'$  in am, 1.5 tablet of 40 mg in the evenings    . gabapentin (NEURONTIN) 100 MG capsule Take 100 mg by mouth 2 (two) times daily. Antibiotic started unsure name, big white pill for her infectionin her  legs, sees MD Friday 11/07/13    . glimepiride (AMARYL) 4 MG tablet Take 4 mg by mouth 2 (two) times daily.    Marland Kitchen ipratropium-albuterol (DUONEB) 0.5-2.5 (3) MG/3ML SOLN Take 3 mLs by nebulization every 6 (six) hours as needed (for shortness of breath).     Marland Kitchen lisinopril (PRINIVIL,ZESTRIL) 2.5 MG tablet Take 2.5 mg by mouth 2 (two) times daily.    . Melatonin 300 MCG TABS Take 1 tablet by mouth at bedtime as needed (for sleep).     . metoprolol succinate (TOPROL-XL) 100 MG 24 hr tablet Take 100 mg by mouth daily. Take with or immediately following a meal.    . Multiple Vitamin (MULTIVITAMIN) tablet Take 1 tablet by mouth daily.    Marland Kitchen omeprazole (PRILOSEC) 20 MG  capsule Take 20 mg by mouth 2 (two) times daily before a meal.    . potassium chloride SA (K-DUR,KLOR-CON) 20 MEQ tablet Take 20 mEq by mouth daily.    . pravastatin (PRAVACHOL) 80 MG tablet Take 80 mg by mouth at bedtime.    . sitaGLIPtin (JANUVIA) 50 MG tablet Take 50 mg by mouth daily.    Alveda Reasons 20 MG TABS tablet Take 20 mg by mouth daily.     . insulin NPH Human (HUMULIN N,NOVOLIN N) 100 UNIT/ML injection Inject 45 Units into the skin daily. Increased to 90  Units daily    . LORazepam (ATIVAN) 0.5 MG tablet Take 1 tablet (0.5 mg total) by mouth once as needed for anxiety. Take 30 min prior to radiation treatment. (Patient not taking: Reported on 03/23/2016) 5 tablet 0  . TRULICITY 1.5 UU/7.2ZD SOPN Take 5 mLs by mouth once a week.     No current facility-administered medications for this encounter.     Physical Findings:  height is '5\' 3"'$  (1.6 m) and weight is 177 lb 9.6 oz (80.6 kg). Her oral temperature is 98 F (36.7 C). Her blood pressure is 142/73 (abnormal) and her pulse is 64. Her respiration is 18 and oxygen saturation is 95%.  In general this is a chronically ill appearing caucasian female in no acute distress. She is alert and oriented x4 and appropriate throughout the examination. HEENT reveals that the patient is normocephalic,  atraumatic. EOMs are intact. PERRLA. Skin is intact without any evidence of gross lesions. Cardiovascular exam reveals a regular rate and rhythm, no clicks rubs or murmurs are auscultated. Chest is clear to auscultation bilaterally. Lymphatic assessment is performed and does not reveal any adenopathy in the cervical, supraclavicular, axillary, or inguinal chains. Abdomen has active bowel sounds in all quadrants and is intact. The abdomen is soft, non tender, non distended. Lower extremities are negative for pretibial pitting edema, deep calf tenderness, cyanosis or clubbing.  Lab Findings: Lab Results  Component Value Date   WBC 6.9 03/20/2013   HGB 11.6 (L) 03/20/2013   HCT 35.9 (L) 03/20/2013   PLT 130 (L) 03/20/2013    Lab Results  Component Value Date   NA 141 03/22/2016   K 4.2 03/22/2016   CHLORIDE 100 03/22/2016   CO2 32 (H) 03/22/2016   GLUCOSE 239 (H) 03/22/2016   BUN 30.0 (H) 03/22/2016   CREATININE 1.5 (H) 03/22/2016   BILITOT 0.3 03/20/2013   ALKPHOS 66 03/20/2013   AST 22 03/20/2013   ALT 19 03/20/2013   PROT 7.6 03/20/2013   ALBUMIN 3.2 (L) 03/20/2013   CALCIUM 9.5 03/22/2016   ANIONGAP 8 03/22/2016    Radiographic Findings: Ct Chest W Contrast  Result Date: 03/23/2016 CLINICAL DATA:  Followup left upper lobe lung carcinoma. Completed radiation therapy. Restaging. EXAM: CT CHEST WITH CONTRAST TECHNIQUE: Multidetector CT imaging of the chest was performed during intravenous contrast administration. CONTRAST:  49m ISOVUE-300 IOPAMIDOL (ISOVUE-300) INJECTION 61% COMPARISON:  CT on 11/22/2015 FINDINGS: Cardiovascular: Stable cardiomegaly. Aortic and coronary artery atherosclerosis. Mediastinum/Nodes: No masses or pathologically enlarged lymph nodes identified. No significant change in tiny left thyroid lobe nodules. Lungs/Pleura: Left apical pulmonary nodule shows no significant change, currently measuring 17 x 12 mm compared to 17 x 14 mm previously. Adjacent linear  opacity in the left upper lobe appears stable, and most consistent post radiation changes. Anterior right upper lobe scarring appears stable, also consistent with post radiation changes. 4 mm pulmonary nodule in the right  middle lobe on image 66/5 is also stable. Increased ill-defined rounded opacity is seen in the peripheral left mid lung involving both the left upper and lower lobes. This currently measures 4.4 x 2.0 cm, compared to 2.3 x 1.4 cm previously. Adjacent ill-defined nodular opacity in the left upper lobe measuring 11 mm on image 62/5 is new since previous study. This may be due to radiation pneumonitis, although neoplasm cannot be excluded. Large calcified granuloma again seen in the posterior left lower lobe. No evidence of pleural effusion. Upper Abdomen: Stable bilateral adrenal masses consistent with benign adenomas. Musculoskeletal:  No suspicious bone lesions. IMPRESSION: Stable left apical pulmonary nodule, and adjacent scarring/ post radiation changes. Stable right upper lobe scarring/post radiation changes, and 4 mm right middle lobe pulmonary nodule. Increased ill-defined rounded opacity in peripheral left mid lung involving both upper and lower lobes, and 11 mm nodule in the posterior left upper lobe. Differential diagnosis includes progressive radiation pneumonitis and carcinoma. Recommend clinical correlation, and consider PET-CT scan or percutaneous needle biopsy. No evidence of lymphadenopathy or pleural effusion. Electronically Signed   By: Earle Gell M.D.   On: 26-Jan-202018 08:40    Impression:  1. 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 The patient's scans were reviewed personally with Dr. Lisbeth Renshaw. Again in comparing these findings with previous scans, there does not appear to be a significant difference to clinically recommend any alteration in her plans for serial imaging. Dr. Lisbeth Renshaw has recommended proceeding with repeat imaging with a PET in  2-3 months. The patient is interested in proceeding in this manner. She will be scheduled for this and follow up to review these results. 2. COPD. The patient will continue to follow up with pulmonology at [facility], and continue her O2. We again will follow this at her next visit.     Carola Rhine, PAC

## 2016-03-30 DIAGNOSIS — I5032 Chronic diastolic (congestive) heart failure: Secondary | ICD-10-CM | POA: Diagnosis not present

## 2016-03-30 DIAGNOSIS — I83012 Varicose veins of right lower extremity with ulcer of calf: Secondary | ICD-10-CM | POA: Diagnosis not present

## 2016-03-30 DIAGNOSIS — J449 Chronic obstructive pulmonary disease, unspecified: Secondary | ICD-10-CM | POA: Diagnosis not present

## 2016-03-30 DIAGNOSIS — E1122 Type 2 diabetes mellitus with diabetic chronic kidney disease: Secondary | ICD-10-CM | POA: Diagnosis not present

## 2016-03-30 DIAGNOSIS — I130 Hypertensive heart and chronic kidney disease with heart failure and stage 1 through stage 4 chronic kidney disease, or unspecified chronic kidney disease: Secondary | ICD-10-CM | POA: Diagnosis not present

## 2016-03-30 DIAGNOSIS — C3490 Malignant neoplasm of unspecified part of unspecified bronchus or lung: Secondary | ICD-10-CM | POA: Diagnosis not present

## 2016-03-30 DIAGNOSIS — Z7901 Long term (current) use of anticoagulants: Secondary | ICD-10-CM | POA: Diagnosis not present

## 2016-03-30 DIAGNOSIS — L97911 Non-pressure chronic ulcer of unspecified part of right lower leg limited to breakdown of skin: Secondary | ICD-10-CM | POA: Diagnosis not present

## 2016-03-30 DIAGNOSIS — Z794 Long term (current) use of insulin: Secondary | ICD-10-CM | POA: Diagnosis not present

## 2016-03-30 DIAGNOSIS — E1140 Type 2 diabetes mellitus with diabetic neuropathy, unspecified: Secondary | ICD-10-CM | POA: Diagnosis not present

## 2016-03-30 DIAGNOSIS — Z9981 Dependence on supplemental oxygen: Secondary | ICD-10-CM | POA: Diagnosis not present

## 2016-03-30 DIAGNOSIS — I482 Chronic atrial fibrillation: Secondary | ICD-10-CM | POA: Diagnosis not present

## 2016-03-30 DIAGNOSIS — Z87891 Personal history of nicotine dependence: Secondary | ICD-10-CM | POA: Diagnosis not present

## 2016-03-30 DIAGNOSIS — I872 Venous insufficiency (chronic) (peripheral): Secondary | ICD-10-CM | POA: Diagnosis not present

## 2016-03-31 DIAGNOSIS — E113293 Type 2 diabetes mellitus with mild nonproliferative diabetic retinopathy without macular edema, bilateral: Secondary | ICD-10-CM | POA: Diagnosis not present

## 2016-03-31 DIAGNOSIS — H25813 Combined forms of age-related cataract, bilateral: Secondary | ICD-10-CM | POA: Diagnosis not present

## 2016-04-01 DIAGNOSIS — J449 Chronic obstructive pulmonary disease, unspecified: Secondary | ICD-10-CM | POA: Diagnosis not present

## 2016-04-03 DIAGNOSIS — I10 Essential (primary) hypertension: Secondary | ICD-10-CM | POA: Diagnosis not present

## 2016-04-03 DIAGNOSIS — K210 Gastro-esophageal reflux disease with esophagitis: Secondary | ICD-10-CM | POA: Diagnosis not present

## 2016-04-03 DIAGNOSIS — D649 Anemia, unspecified: Secondary | ICD-10-CM | POA: Diagnosis not present

## 2016-04-03 DIAGNOSIS — N183 Chronic kidney disease, stage 3 (moderate): Secondary | ICD-10-CM | POA: Diagnosis not present

## 2016-04-03 DIAGNOSIS — J449 Chronic obstructive pulmonary disease, unspecified: Secondary | ICD-10-CM | POA: Diagnosis not present

## 2016-04-03 DIAGNOSIS — E785 Hyperlipidemia, unspecified: Secondary | ICD-10-CM | POA: Diagnosis not present

## 2016-04-04 DIAGNOSIS — I130 Hypertensive heart and chronic kidney disease with heart failure and stage 1 through stage 4 chronic kidney disease, or unspecified chronic kidney disease: Secondary | ICD-10-CM | POA: Diagnosis not present

## 2016-04-04 DIAGNOSIS — Z87891 Personal history of nicotine dependence: Secondary | ICD-10-CM | POA: Diagnosis not present

## 2016-04-04 DIAGNOSIS — Z7901 Long term (current) use of anticoagulants: Secondary | ICD-10-CM | POA: Diagnosis not present

## 2016-04-04 DIAGNOSIS — I5032 Chronic diastolic (congestive) heart failure: Secondary | ICD-10-CM | POA: Diagnosis not present

## 2016-04-04 DIAGNOSIS — I83012 Varicose veins of right lower extremity with ulcer of calf: Secondary | ICD-10-CM | POA: Diagnosis not present

## 2016-04-04 DIAGNOSIS — J449 Chronic obstructive pulmonary disease, unspecified: Secondary | ICD-10-CM | POA: Diagnosis not present

## 2016-04-04 DIAGNOSIS — E1122 Type 2 diabetes mellitus with diabetic chronic kidney disease: Secondary | ICD-10-CM | POA: Diagnosis not present

## 2016-04-04 DIAGNOSIS — I482 Chronic atrial fibrillation: Secondary | ICD-10-CM | POA: Diagnosis not present

## 2016-04-04 DIAGNOSIS — Z9981 Dependence on supplemental oxygen: Secondary | ICD-10-CM | POA: Diagnosis not present

## 2016-04-04 DIAGNOSIS — C3490 Malignant neoplasm of unspecified part of unspecified bronchus or lung: Secondary | ICD-10-CM | POA: Diagnosis not present

## 2016-04-04 DIAGNOSIS — Z794 Long term (current) use of insulin: Secondary | ICD-10-CM | POA: Diagnosis not present

## 2016-04-04 DIAGNOSIS — I872 Venous insufficiency (chronic) (peripheral): Secondary | ICD-10-CM | POA: Diagnosis not present

## 2016-04-04 DIAGNOSIS — E1140 Type 2 diabetes mellitus with diabetic neuropathy, unspecified: Secondary | ICD-10-CM | POA: Diagnosis not present

## 2016-04-04 DIAGNOSIS — L97911 Non-pressure chronic ulcer of unspecified part of right lower leg limited to breakdown of skin: Secondary | ICD-10-CM | POA: Diagnosis not present

## 2016-04-06 DIAGNOSIS — C3490 Malignant neoplasm of unspecified part of unspecified bronchus or lung: Secondary | ICD-10-CM | POA: Diagnosis not present

## 2016-04-06 DIAGNOSIS — E1140 Type 2 diabetes mellitus with diabetic neuropathy, unspecified: Secondary | ICD-10-CM | POA: Diagnosis not present

## 2016-04-06 DIAGNOSIS — J449 Chronic obstructive pulmonary disease, unspecified: Secondary | ICD-10-CM | POA: Diagnosis not present

## 2016-04-06 DIAGNOSIS — Z7901 Long term (current) use of anticoagulants: Secondary | ICD-10-CM | POA: Diagnosis not present

## 2016-04-06 DIAGNOSIS — I5032 Chronic diastolic (congestive) heart failure: Secondary | ICD-10-CM | POA: Diagnosis not present

## 2016-04-06 DIAGNOSIS — Z9981 Dependence on supplemental oxygen: Secondary | ICD-10-CM | POA: Diagnosis not present

## 2016-04-06 DIAGNOSIS — L97911 Non-pressure chronic ulcer of unspecified part of right lower leg limited to breakdown of skin: Secondary | ICD-10-CM | POA: Diagnosis not present

## 2016-04-06 DIAGNOSIS — I872 Venous insufficiency (chronic) (peripheral): Secondary | ICD-10-CM | POA: Diagnosis not present

## 2016-04-06 DIAGNOSIS — Z87891 Personal history of nicotine dependence: Secondary | ICD-10-CM | POA: Diagnosis not present

## 2016-04-06 DIAGNOSIS — Z794 Long term (current) use of insulin: Secondary | ICD-10-CM | POA: Diagnosis not present

## 2016-04-06 DIAGNOSIS — I130 Hypertensive heart and chronic kidney disease with heart failure and stage 1 through stage 4 chronic kidney disease, or unspecified chronic kidney disease: Secondary | ICD-10-CM | POA: Diagnosis not present

## 2016-04-06 DIAGNOSIS — I83012 Varicose veins of right lower extremity with ulcer of calf: Secondary | ICD-10-CM | POA: Diagnosis not present

## 2016-04-06 DIAGNOSIS — I482 Chronic atrial fibrillation: Secondary | ICD-10-CM | POA: Diagnosis not present

## 2016-04-06 DIAGNOSIS — E1122 Type 2 diabetes mellitus with diabetic chronic kidney disease: Secondary | ICD-10-CM | POA: Diagnosis not present

## 2016-04-11 DIAGNOSIS — I872 Venous insufficiency (chronic) (peripheral): Secondary | ICD-10-CM | POA: Diagnosis not present

## 2016-04-11 DIAGNOSIS — Z87891 Personal history of nicotine dependence: Secondary | ICD-10-CM | POA: Diagnosis not present

## 2016-04-11 DIAGNOSIS — I83012 Varicose veins of right lower extremity with ulcer of calf: Secondary | ICD-10-CM | POA: Diagnosis not present

## 2016-04-11 DIAGNOSIS — Z9981 Dependence on supplemental oxygen: Secondary | ICD-10-CM | POA: Diagnosis not present

## 2016-04-11 DIAGNOSIS — Z7901 Long term (current) use of anticoagulants: Secondary | ICD-10-CM | POA: Diagnosis not present

## 2016-04-11 DIAGNOSIS — I130 Hypertensive heart and chronic kidney disease with heart failure and stage 1 through stage 4 chronic kidney disease, or unspecified chronic kidney disease: Secondary | ICD-10-CM | POA: Diagnosis not present

## 2016-04-11 DIAGNOSIS — E1140 Type 2 diabetes mellitus with diabetic neuropathy, unspecified: Secondary | ICD-10-CM | POA: Diagnosis not present

## 2016-04-11 DIAGNOSIS — L97911 Non-pressure chronic ulcer of unspecified part of right lower leg limited to breakdown of skin: Secondary | ICD-10-CM | POA: Diagnosis not present

## 2016-04-11 DIAGNOSIS — I482 Chronic atrial fibrillation: Secondary | ICD-10-CM | POA: Diagnosis not present

## 2016-04-11 DIAGNOSIS — C3490 Malignant neoplasm of unspecified part of unspecified bronchus or lung: Secondary | ICD-10-CM | POA: Diagnosis not present

## 2016-04-11 DIAGNOSIS — E1122 Type 2 diabetes mellitus with diabetic chronic kidney disease: Secondary | ICD-10-CM | POA: Diagnosis not present

## 2016-04-11 DIAGNOSIS — J449 Chronic obstructive pulmonary disease, unspecified: Secondary | ICD-10-CM | POA: Diagnosis not present

## 2016-04-11 DIAGNOSIS — I5032 Chronic diastolic (congestive) heart failure: Secondary | ICD-10-CM | POA: Diagnosis not present

## 2016-04-11 DIAGNOSIS — Z794 Long term (current) use of insulin: Secondary | ICD-10-CM | POA: Diagnosis not present

## 2016-04-13 DIAGNOSIS — E1140 Type 2 diabetes mellitus with diabetic neuropathy, unspecified: Secondary | ICD-10-CM | POA: Diagnosis not present

## 2016-04-13 DIAGNOSIS — Z794 Long term (current) use of insulin: Secondary | ICD-10-CM | POA: Diagnosis not present

## 2016-04-13 DIAGNOSIS — L97911 Non-pressure chronic ulcer of unspecified part of right lower leg limited to breakdown of skin: Secondary | ICD-10-CM | POA: Diagnosis not present

## 2016-04-13 DIAGNOSIS — I482 Chronic atrial fibrillation: Secondary | ICD-10-CM | POA: Diagnosis not present

## 2016-04-13 DIAGNOSIS — I130 Hypertensive heart and chronic kidney disease with heart failure and stage 1 through stage 4 chronic kidney disease, or unspecified chronic kidney disease: Secondary | ICD-10-CM | POA: Diagnosis not present

## 2016-04-13 DIAGNOSIS — J449 Chronic obstructive pulmonary disease, unspecified: Secondary | ICD-10-CM | POA: Diagnosis not present

## 2016-04-13 DIAGNOSIS — I83012 Varicose veins of right lower extremity with ulcer of calf: Secondary | ICD-10-CM | POA: Diagnosis not present

## 2016-04-13 DIAGNOSIS — Z7901 Long term (current) use of anticoagulants: Secondary | ICD-10-CM | POA: Diagnosis not present

## 2016-04-13 DIAGNOSIS — Z9981 Dependence on supplemental oxygen: Secondary | ICD-10-CM | POA: Diagnosis not present

## 2016-04-13 DIAGNOSIS — I872 Venous insufficiency (chronic) (peripheral): Secondary | ICD-10-CM | POA: Diagnosis not present

## 2016-04-13 DIAGNOSIS — E1122 Type 2 diabetes mellitus with diabetic chronic kidney disease: Secondary | ICD-10-CM | POA: Diagnosis not present

## 2016-04-13 DIAGNOSIS — I5032 Chronic diastolic (congestive) heart failure: Secondary | ICD-10-CM | POA: Diagnosis not present

## 2016-04-13 DIAGNOSIS — C3490 Malignant neoplasm of unspecified part of unspecified bronchus or lung: Secondary | ICD-10-CM | POA: Diagnosis not present

## 2016-04-13 DIAGNOSIS — Z87891 Personal history of nicotine dependence: Secondary | ICD-10-CM | POA: Diagnosis not present

## 2016-04-20 DIAGNOSIS — Z9981 Dependence on supplemental oxygen: Secondary | ICD-10-CM | POA: Diagnosis not present

## 2016-04-20 DIAGNOSIS — L97911 Non-pressure chronic ulcer of unspecified part of right lower leg limited to breakdown of skin: Secondary | ICD-10-CM | POA: Diagnosis not present

## 2016-04-20 DIAGNOSIS — E1140 Type 2 diabetes mellitus with diabetic neuropathy, unspecified: Secondary | ICD-10-CM | POA: Diagnosis not present

## 2016-04-20 DIAGNOSIS — I5032 Chronic diastolic (congestive) heart failure: Secondary | ICD-10-CM | POA: Diagnosis not present

## 2016-04-20 DIAGNOSIS — Z87891 Personal history of nicotine dependence: Secondary | ICD-10-CM | POA: Diagnosis not present

## 2016-04-20 DIAGNOSIS — C3490 Malignant neoplasm of unspecified part of unspecified bronchus or lung: Secondary | ICD-10-CM | POA: Diagnosis not present

## 2016-04-20 DIAGNOSIS — I482 Chronic atrial fibrillation: Secondary | ICD-10-CM | POA: Diagnosis not present

## 2016-04-20 DIAGNOSIS — E1122 Type 2 diabetes mellitus with diabetic chronic kidney disease: Secondary | ICD-10-CM | POA: Diagnosis not present

## 2016-04-20 DIAGNOSIS — J449 Chronic obstructive pulmonary disease, unspecified: Secondary | ICD-10-CM | POA: Diagnosis not present

## 2016-04-20 DIAGNOSIS — I872 Venous insufficiency (chronic) (peripheral): Secondary | ICD-10-CM | POA: Diagnosis not present

## 2016-04-20 DIAGNOSIS — I83012 Varicose veins of right lower extremity with ulcer of calf: Secondary | ICD-10-CM | POA: Diagnosis not present

## 2016-04-20 DIAGNOSIS — Z7901 Long term (current) use of anticoagulants: Secondary | ICD-10-CM | POA: Diagnosis not present

## 2016-04-20 DIAGNOSIS — Z794 Long term (current) use of insulin: Secondary | ICD-10-CM | POA: Diagnosis not present

## 2016-04-20 DIAGNOSIS — I130 Hypertensive heart and chronic kidney disease with heart failure and stage 1 through stage 4 chronic kidney disease, or unspecified chronic kidney disease: Secondary | ICD-10-CM | POA: Diagnosis not present

## 2016-04-24 DIAGNOSIS — R413 Other amnesia: Secondary | ICD-10-CM | POA: Diagnosis not present

## 2016-04-24 DIAGNOSIS — R300 Dysuria: Secondary | ICD-10-CM | POA: Diagnosis not present

## 2016-04-24 DIAGNOSIS — E538 Deficiency of other specified B group vitamins: Secondary | ICD-10-CM | POA: Diagnosis not present

## 2016-04-24 DIAGNOSIS — I10 Essential (primary) hypertension: Secondary | ICD-10-CM | POA: Diagnosis not present

## 2016-04-24 DIAGNOSIS — Z79899 Other long term (current) drug therapy: Secondary | ICD-10-CM | POA: Diagnosis not present

## 2016-04-24 DIAGNOSIS — N183 Chronic kidney disease, stage 3 (moderate): Secondary | ICD-10-CM | POA: Diagnosis not present

## 2016-04-24 DIAGNOSIS — R269 Unspecified abnormalities of gait and mobility: Secondary | ICD-10-CM | POA: Diagnosis not present

## 2016-04-27 DIAGNOSIS — J449 Chronic obstructive pulmonary disease, unspecified: Secondary | ICD-10-CM | POA: Diagnosis not present

## 2016-04-27 DIAGNOSIS — Z87891 Personal history of nicotine dependence: Secondary | ICD-10-CM | POA: Diagnosis not present

## 2016-04-27 DIAGNOSIS — Z7901 Long term (current) use of anticoagulants: Secondary | ICD-10-CM | POA: Diagnosis not present

## 2016-04-27 DIAGNOSIS — I872 Venous insufficiency (chronic) (peripheral): Secondary | ICD-10-CM | POA: Diagnosis not present

## 2016-04-27 DIAGNOSIS — L97911 Non-pressure chronic ulcer of unspecified part of right lower leg limited to breakdown of skin: Secondary | ICD-10-CM | POA: Diagnosis not present

## 2016-04-27 DIAGNOSIS — I482 Chronic atrial fibrillation: Secondary | ICD-10-CM | POA: Diagnosis not present

## 2016-04-27 DIAGNOSIS — E1140 Type 2 diabetes mellitus with diabetic neuropathy, unspecified: Secondary | ICD-10-CM | POA: Diagnosis not present

## 2016-04-27 DIAGNOSIS — Z794 Long term (current) use of insulin: Secondary | ICD-10-CM | POA: Diagnosis not present

## 2016-04-27 DIAGNOSIS — I130 Hypertensive heart and chronic kidney disease with heart failure and stage 1 through stage 4 chronic kidney disease, or unspecified chronic kidney disease: Secondary | ICD-10-CM | POA: Diagnosis not present

## 2016-04-27 DIAGNOSIS — Z9981 Dependence on supplemental oxygen: Secondary | ICD-10-CM | POA: Diagnosis not present

## 2016-04-27 DIAGNOSIS — E1122 Type 2 diabetes mellitus with diabetic chronic kidney disease: Secondary | ICD-10-CM | POA: Diagnosis not present

## 2016-04-27 DIAGNOSIS — C3490 Malignant neoplasm of unspecified part of unspecified bronchus or lung: Secondary | ICD-10-CM | POA: Diagnosis not present

## 2016-04-27 DIAGNOSIS — I5032 Chronic diastolic (congestive) heart failure: Secondary | ICD-10-CM | POA: Diagnosis not present

## 2016-04-27 DIAGNOSIS — I83012 Varicose veins of right lower extremity with ulcer of calf: Secondary | ICD-10-CM | POA: Diagnosis not present

## 2016-04-29 DIAGNOSIS — J449 Chronic obstructive pulmonary disease, unspecified: Secondary | ICD-10-CM | POA: Diagnosis not present

## 2016-05-04 DIAGNOSIS — E1140 Type 2 diabetes mellitus with diabetic neuropathy, unspecified: Secondary | ICD-10-CM | POA: Diagnosis not present

## 2016-05-04 DIAGNOSIS — I5032 Chronic diastolic (congestive) heart failure: Secondary | ICD-10-CM | POA: Diagnosis not present

## 2016-05-04 DIAGNOSIS — I83012 Varicose veins of right lower extremity with ulcer of calf: Secondary | ICD-10-CM | POA: Diagnosis not present

## 2016-05-04 DIAGNOSIS — I482 Chronic atrial fibrillation: Secondary | ICD-10-CM | POA: Diagnosis not present

## 2016-05-04 DIAGNOSIS — Z87891 Personal history of nicotine dependence: Secondary | ICD-10-CM | POA: Diagnosis not present

## 2016-05-04 DIAGNOSIS — I872 Venous insufficiency (chronic) (peripheral): Secondary | ICD-10-CM | POA: Diagnosis not present

## 2016-05-04 DIAGNOSIS — E1122 Type 2 diabetes mellitus with diabetic chronic kidney disease: Secondary | ICD-10-CM | POA: Diagnosis not present

## 2016-05-04 DIAGNOSIS — Z794 Long term (current) use of insulin: Secondary | ICD-10-CM | POA: Diagnosis not present

## 2016-05-04 DIAGNOSIS — I130 Hypertensive heart and chronic kidney disease with heart failure and stage 1 through stage 4 chronic kidney disease, or unspecified chronic kidney disease: Secondary | ICD-10-CM | POA: Diagnosis not present

## 2016-05-04 DIAGNOSIS — L97911 Non-pressure chronic ulcer of unspecified part of right lower leg limited to breakdown of skin: Secondary | ICD-10-CM | POA: Diagnosis not present

## 2016-05-04 DIAGNOSIS — C3490 Malignant neoplasm of unspecified part of unspecified bronchus or lung: Secondary | ICD-10-CM | POA: Diagnosis not present

## 2016-05-04 DIAGNOSIS — J449 Chronic obstructive pulmonary disease, unspecified: Secondary | ICD-10-CM | POA: Diagnosis not present

## 2016-05-04 DIAGNOSIS — Z7901 Long term (current) use of anticoagulants: Secondary | ICD-10-CM | POA: Diagnosis not present

## 2016-05-04 DIAGNOSIS — Z9981 Dependence on supplemental oxygen: Secondary | ICD-10-CM | POA: Diagnosis not present

## 2016-05-05 DIAGNOSIS — N183 Chronic kidney disease, stage 3 (moderate): Secondary | ICD-10-CM | POA: Diagnosis not present

## 2016-05-05 DIAGNOSIS — M109 Gout, unspecified: Secondary | ICD-10-CM | POA: Diagnosis not present

## 2016-05-05 DIAGNOSIS — B372 Candidiasis of skin and nail: Secondary | ICD-10-CM | POA: Diagnosis not present

## 2016-05-09 DIAGNOSIS — E1140 Type 2 diabetes mellitus with diabetic neuropathy, unspecified: Secondary | ICD-10-CM | POA: Diagnosis not present

## 2016-05-09 DIAGNOSIS — Z7901 Long term (current) use of anticoagulants: Secondary | ICD-10-CM | POA: Diagnosis not present

## 2016-05-09 DIAGNOSIS — I130 Hypertensive heart and chronic kidney disease with heart failure and stage 1 through stage 4 chronic kidney disease, or unspecified chronic kidney disease: Secondary | ICD-10-CM | POA: Diagnosis not present

## 2016-05-09 DIAGNOSIS — Z9981 Dependence on supplemental oxygen: Secondary | ICD-10-CM | POA: Diagnosis not present

## 2016-05-09 DIAGNOSIS — I5032 Chronic diastolic (congestive) heart failure: Secondary | ICD-10-CM | POA: Diagnosis not present

## 2016-05-09 DIAGNOSIS — C3490 Malignant neoplasm of unspecified part of unspecified bronchus or lung: Secondary | ICD-10-CM | POA: Diagnosis not present

## 2016-05-09 DIAGNOSIS — I872 Venous insufficiency (chronic) (peripheral): Secondary | ICD-10-CM | POA: Diagnosis not present

## 2016-05-09 DIAGNOSIS — I482 Chronic atrial fibrillation: Secondary | ICD-10-CM | POA: Diagnosis not present

## 2016-05-09 DIAGNOSIS — E1122 Type 2 diabetes mellitus with diabetic chronic kidney disease: Secondary | ICD-10-CM | POA: Diagnosis not present

## 2016-05-09 DIAGNOSIS — Z87891 Personal history of nicotine dependence: Secondary | ICD-10-CM | POA: Diagnosis not present

## 2016-05-09 DIAGNOSIS — I83012 Varicose veins of right lower extremity with ulcer of calf: Secondary | ICD-10-CM | POA: Diagnosis not present

## 2016-05-09 DIAGNOSIS — Z794 Long term (current) use of insulin: Secondary | ICD-10-CM | POA: Diagnosis not present

## 2016-05-09 DIAGNOSIS — L97911 Non-pressure chronic ulcer of unspecified part of right lower leg limited to breakdown of skin: Secondary | ICD-10-CM | POA: Diagnosis not present

## 2016-05-09 DIAGNOSIS — J449 Chronic obstructive pulmonary disease, unspecified: Secondary | ICD-10-CM | POA: Diagnosis not present

## 2016-05-11 DIAGNOSIS — I482 Chronic atrial fibrillation: Secondary | ICD-10-CM | POA: Diagnosis not present

## 2016-05-11 DIAGNOSIS — Z9981 Dependence on supplemental oxygen: Secondary | ICD-10-CM | POA: Diagnosis not present

## 2016-05-11 DIAGNOSIS — E1122 Type 2 diabetes mellitus with diabetic chronic kidney disease: Secondary | ICD-10-CM | POA: Diagnosis not present

## 2016-05-11 DIAGNOSIS — C3490 Malignant neoplasm of unspecified part of unspecified bronchus or lung: Secondary | ICD-10-CM | POA: Diagnosis not present

## 2016-05-11 DIAGNOSIS — E1140 Type 2 diabetes mellitus with diabetic neuropathy, unspecified: Secondary | ICD-10-CM | POA: Diagnosis not present

## 2016-05-11 DIAGNOSIS — Z7901 Long term (current) use of anticoagulants: Secondary | ICD-10-CM | POA: Diagnosis not present

## 2016-05-11 DIAGNOSIS — J449 Chronic obstructive pulmonary disease, unspecified: Secondary | ICD-10-CM | POA: Diagnosis not present

## 2016-05-11 DIAGNOSIS — I83012 Varicose veins of right lower extremity with ulcer of calf: Secondary | ICD-10-CM | POA: Diagnosis not present

## 2016-05-11 DIAGNOSIS — Z87891 Personal history of nicotine dependence: Secondary | ICD-10-CM | POA: Diagnosis not present

## 2016-05-11 DIAGNOSIS — I872 Venous insufficiency (chronic) (peripheral): Secondary | ICD-10-CM | POA: Diagnosis not present

## 2016-05-11 DIAGNOSIS — Z794 Long term (current) use of insulin: Secondary | ICD-10-CM | POA: Diagnosis not present

## 2016-05-11 DIAGNOSIS — L97911 Non-pressure chronic ulcer of unspecified part of right lower leg limited to breakdown of skin: Secondary | ICD-10-CM | POA: Diagnosis not present

## 2016-05-11 DIAGNOSIS — I5032 Chronic diastolic (congestive) heart failure: Secondary | ICD-10-CM | POA: Diagnosis not present

## 2016-05-11 DIAGNOSIS — I130 Hypertensive heart and chronic kidney disease with heart failure and stage 1 through stage 4 chronic kidney disease, or unspecified chronic kidney disease: Secondary | ICD-10-CM | POA: Diagnosis not present

## 2016-05-14 IMAGING — CT NM PET TUM IMG RESTAG (PS) SKULL BASE T - THIGH
8 of 9 series · 18 of 25 positions shown · non-contrast
Comparison: 01/19/2015 chest CT.  07/14/2014 PET-CT.

CLINICAL DATA: Subsequent treatment strategy for squamous cell lung
carcinoma of the left upper lobe diagnosed on March 2013
bronchoscopy, completed radiation treatment of the left upper lobe
on 04/21/2013, with subsequent hypermetabolic right upper lobe
pulmonary nodule for which radiation treatment was completed in August 2014, now with enlarging separate left upper lobe pulmonary nodule.

EXAM:
NUCLEAR MEDICINE PET SKULL BASE TO THIGH
TECHNIQUE: 11.0 mCi F-18 FDG was injected intravenously. Full-ring PET imaging
was performed from the skull base to thigh after the radiotracer. CT
data was obtained and used for attenuation correction and anatomic
localization.
FASTING BLOOD GLUCOSE:  Value: 161 mg/dl

[Series 3: pet sk_thigh ac · axial · 5.0mm · 4.07mm/px · z∈[-948,-92]mm · 3 of 215 slices shown]
[im 1/215]
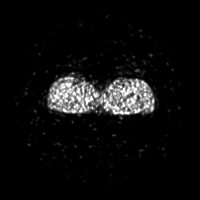
[im 143/215]
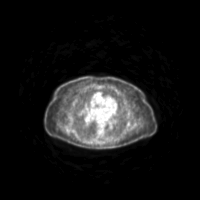
[im 215/215]
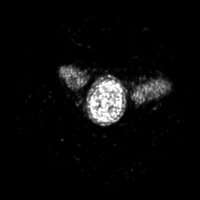

[Series 4: ct sk_thigh 5.0 hd_fov · axial · 5.0mm · 1.08mm/px · z∈[-948,-92]mm · 3 of 215 slices shown]
[im 1/215]
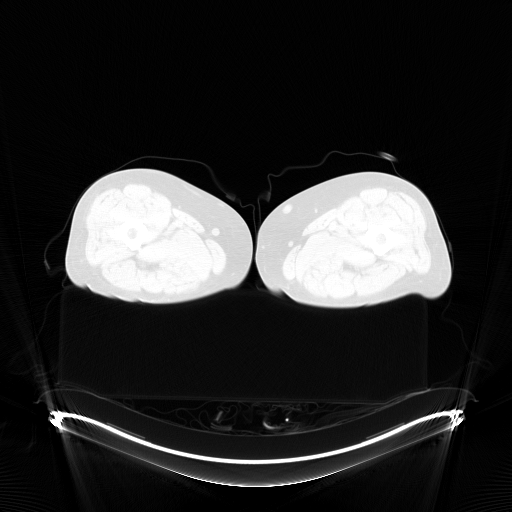
[im 143/215]
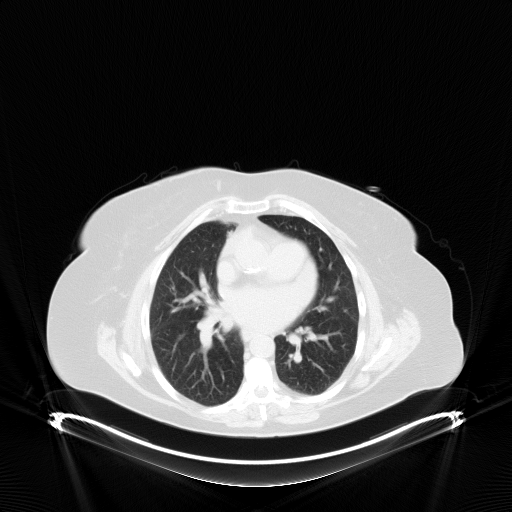
[im 215/215  brain]
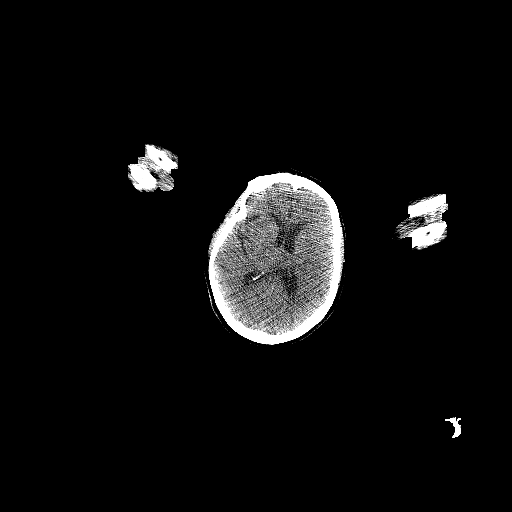

[Series 7: pet sk_thigh nac · axial · 5.0mm · 4.07mm/px · z∈[-736,-308]mm · 3 of 215 slices shown]
[im 54/215]
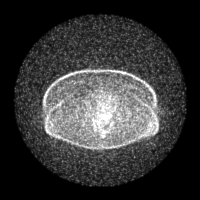
[im 108/215]
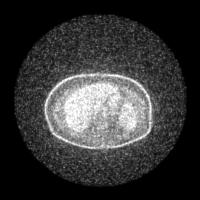
[im 161/215]
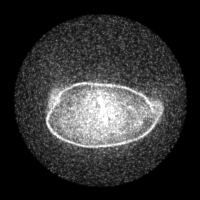

[Series 8: ct sk_thigh 5.0 b70f (id)_bone · axial · 5.0mm · 0.67mm/px · z∈[-514,-242]mm · 2 of 69 slices shown]
[im 1/69  bone]
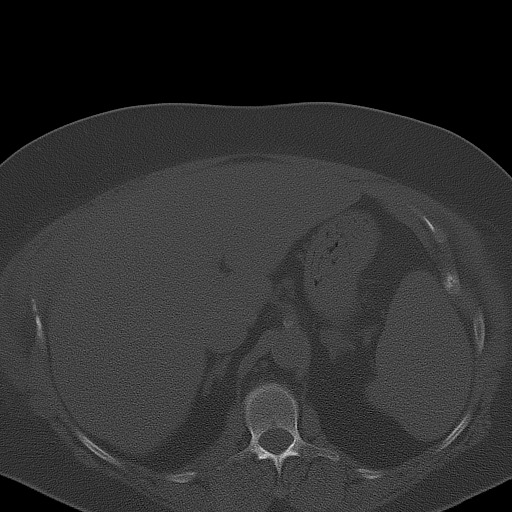
[im 69/69  bone]
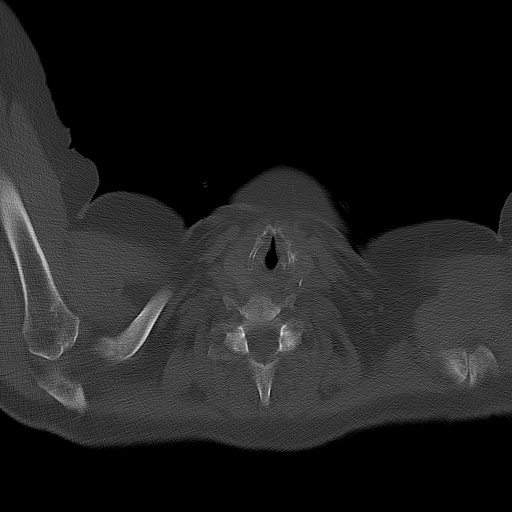

[Series 604: range-ct sk_thigh 5.0 hd_fov-cor-<alpha range> · 1 of 71 slices shown]
[im 1/71]
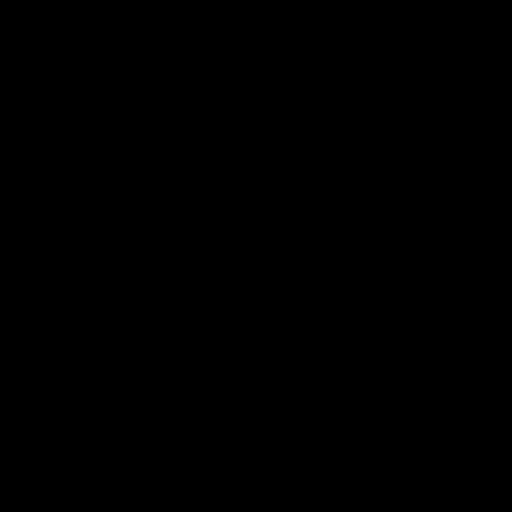

[Series 605: mip collection<mip range> · coronal · 1.78mm/px · 1 of 32 slices shown]
[im 1/32]
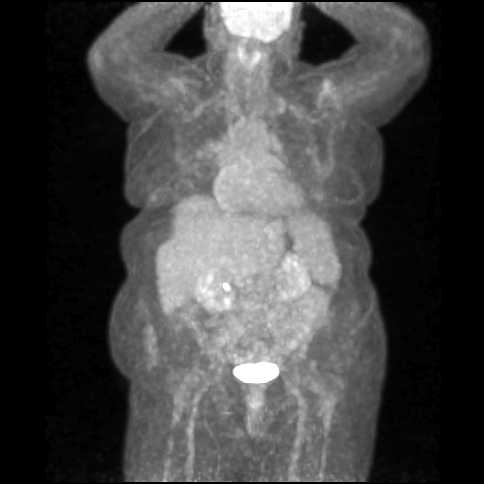

[Series 606: range-ct sk_thigh 5.0 hd_fov-tra-<alpha range> · 4 of 197 slices shown]
[im 1/197]
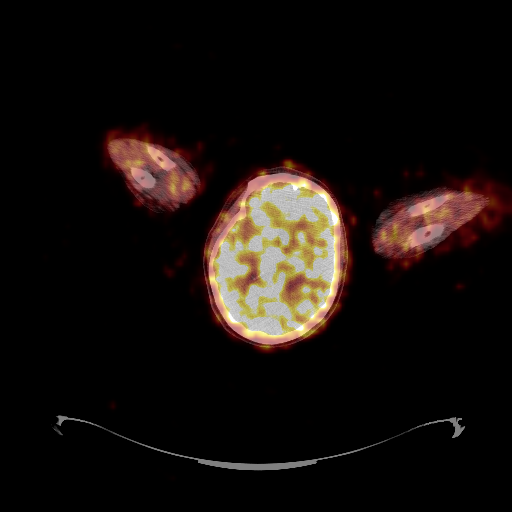
[im 99/197]
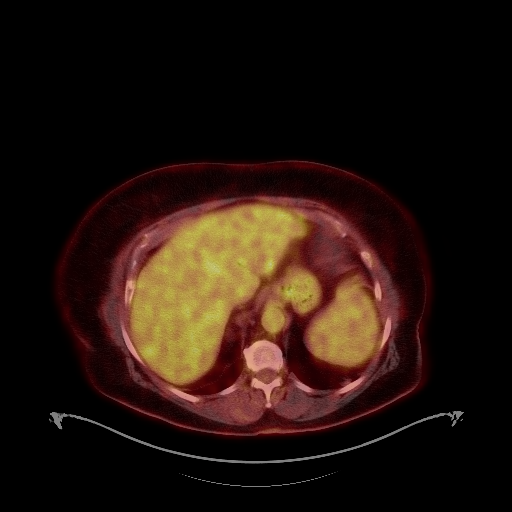
[im 148/197]
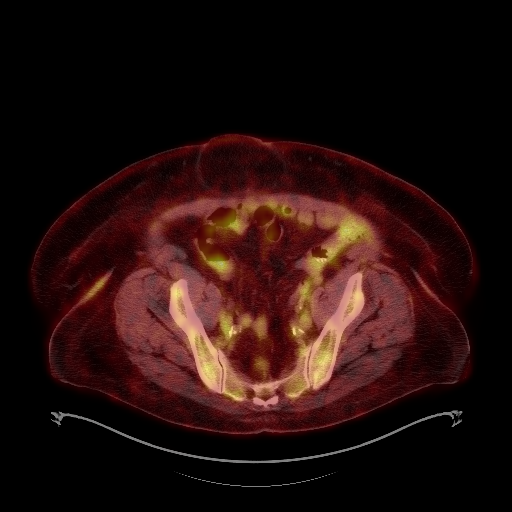
[im 197/197]
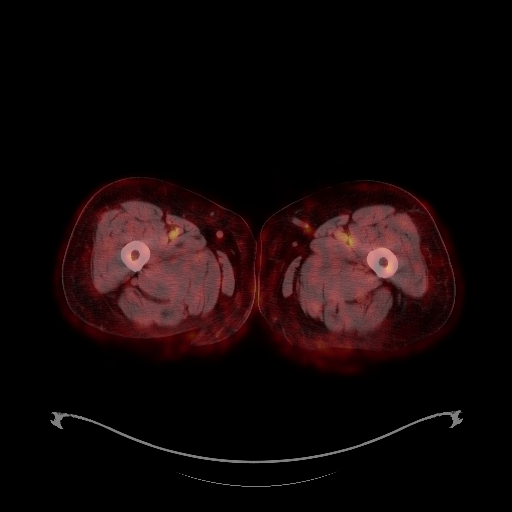

[Series 1180: results mm oncology reading · 1.18mm/px · 1 of 1 slices shown]
[im 1/1]
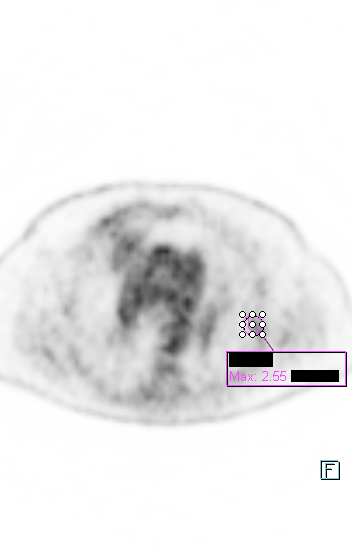

[18 of 25 positions shown; findings below may reference images not displayed]

FINDINGS: NECK

No hypermetabolic lymph nodes in the neck. Stable mild multinodular
goiter with a dominant 1.4 cm lower left thyroid lobe hypodense
nodule, with no hypermetabolic thyroid nodules. Complete
opacification of the bilateral maxillary sinuses with thickening of
the walls of the bilateral maxillary sinuses, in keeping with
chronic sinusitis, unchanged.

CHEST

There is sharply marginated patchy consolidation and ground-glass
opacity in the medial basilar right upper lobe surrounding the site
of the treated right upper lobe pulmonary nodule, with associated
low level metabolism (max SUV 4.1), most in keeping with evolving
postradiation change. No focal measurable residual nodule or focal
hypermetabolism in the right upper lobe to suggest local tumor
recurrence.

There is sharply marginated bandlike patchy consolidation and
ground-glass opacity in the apical left upper lobe at the site of
the previously treated left upper lobe pulmonary nodule, with
associated volume loss and low level metabolism (max SUV 3.8), most
in keeping with continued evolution of radiation fibrosis. No focal
measurable nodule or focal hypermetabolism in the apical left upper
lobe to suggest local tumor recurrence.

There is a subsolid 1.1 x 0.9 cm pulmonary nodule in the posterior
left upper lobe (series 8/image 29) with low level metabolism (max
SUV 2.6), which measured 1.0 x 0.9 cm on 01/19/2015, 1.0 x 0.9 cm on
07/14/2014 and 0.7 x 0.6 cm on 10/23/2012, in keeping with a slowly
growing subsolid pulmonary nodule.

There is a stable coarsely calcified granuloma in the basilar left
lower lobe. Otherwise no acute consolidative airspace disease or new
significant pulmonary nodules.

No hypermetabolic axillary, mediastinal or hilar nodes. Stable mild
cardiomegaly. Coronary atherosclerosis. Stable dilated main
pulmonary artery (3.3 cm diameter).

ABDOMEN/PELVIS

No abnormal hypermetabolic activity within the liver, pancreas,
adrenal glands, or spleen. No hypermetabolic lymph nodes in the
abdomen or pelvis. Moderate diffuse hepatic steatosis.
Cholelithiasis. Stable 1.9 cm and 2.0 cm bilateral adrenal adenomas.
Hysterectomy. Stable large fat containing supraumbilical midline
ventral abdominal hernia.

SKELETON

No focal hypermetabolic activity to suggest skeletal metastasis.
IMPRESSION: 1. PET-CT findings favor evolving postradiation change at the site
of the treated right upper lobe pulmonary nodule, with no evidence
of local tumor recurrence in the right upper lobe.
2. PET-CT findings favor evolving radiation fibrosis at the site of
the treated apical left upper lobe pulmonary nodule, with no
evidence of local tumor recurrence in the apical left upper lobe.
3. Separate subsolid 1.1 cm pulmonary nodule in the posterior left
upper lobe with associated low level metabolism (max SUV 2.6), which
has grown slowly since the 10/23/2012 chest CT, and is most likely
to represent an indolent metachronous lung adenocarcinoma.
4. No hypermetabolic thoracic lymphadenopathy. No hypermetabolic
distant metastatic disease.
5. Numerous stable chronic findings including chronic bilateral
maxillary sinusitis, mild multinodular goiter without hypermetabolic
thyroid nodules, mild cardiomegaly with coronary atherosclerosis,
dilated main pulmonary artery suggesting chronic pulmonary arterial
hypertension, moderate diffuse hepatic steatosis, cholelithiasis,
bilateral adrenal adenomas and large supraumbilical fat containing
ventral abdominal hernia.

## 2016-05-16 DIAGNOSIS — I482 Chronic atrial fibrillation: Secondary | ICD-10-CM | POA: Diagnosis not present

## 2016-05-16 DIAGNOSIS — E1140 Type 2 diabetes mellitus with diabetic neuropathy, unspecified: Secondary | ICD-10-CM | POA: Diagnosis not present

## 2016-05-16 DIAGNOSIS — E1122 Type 2 diabetes mellitus with diabetic chronic kidney disease: Secondary | ICD-10-CM | POA: Diagnosis not present

## 2016-05-16 DIAGNOSIS — Z9981 Dependence on supplemental oxygen: Secondary | ICD-10-CM | POA: Diagnosis not present

## 2016-05-16 DIAGNOSIS — J449 Chronic obstructive pulmonary disease, unspecified: Secondary | ICD-10-CM | POA: Diagnosis not present

## 2016-05-16 DIAGNOSIS — Z87891 Personal history of nicotine dependence: Secondary | ICD-10-CM | POA: Diagnosis not present

## 2016-05-16 DIAGNOSIS — I130 Hypertensive heart and chronic kidney disease with heart failure and stage 1 through stage 4 chronic kidney disease, or unspecified chronic kidney disease: Secondary | ICD-10-CM | POA: Diagnosis not present

## 2016-05-16 DIAGNOSIS — I83012 Varicose veins of right lower extremity with ulcer of calf: Secondary | ICD-10-CM | POA: Diagnosis not present

## 2016-05-16 DIAGNOSIS — I872 Venous insufficiency (chronic) (peripheral): Secondary | ICD-10-CM | POA: Diagnosis not present

## 2016-05-16 DIAGNOSIS — Z794 Long term (current) use of insulin: Secondary | ICD-10-CM | POA: Diagnosis not present

## 2016-05-16 DIAGNOSIS — L97911 Non-pressure chronic ulcer of unspecified part of right lower leg limited to breakdown of skin: Secondary | ICD-10-CM | POA: Diagnosis not present

## 2016-05-16 DIAGNOSIS — Z7901 Long term (current) use of anticoagulants: Secondary | ICD-10-CM | POA: Diagnosis not present

## 2016-05-16 DIAGNOSIS — C3490 Malignant neoplasm of unspecified part of unspecified bronchus or lung: Secondary | ICD-10-CM | POA: Diagnosis not present

## 2016-05-16 DIAGNOSIS — I5032 Chronic diastolic (congestive) heart failure: Secondary | ICD-10-CM | POA: Diagnosis not present

## 2016-05-18 DIAGNOSIS — Z9981 Dependence on supplemental oxygen: Secondary | ICD-10-CM | POA: Diagnosis not present

## 2016-05-18 DIAGNOSIS — Z7901 Long term (current) use of anticoagulants: Secondary | ICD-10-CM | POA: Diagnosis not present

## 2016-05-18 DIAGNOSIS — C3490 Malignant neoplasm of unspecified part of unspecified bronchus or lung: Secondary | ICD-10-CM | POA: Diagnosis not present

## 2016-05-18 DIAGNOSIS — E119 Type 2 diabetes mellitus without complications: Secondary | ICD-10-CM | POA: Diagnosis not present

## 2016-05-18 DIAGNOSIS — I5032 Chronic diastolic (congestive) heart failure: Secondary | ICD-10-CM | POA: Diagnosis not present

## 2016-05-18 DIAGNOSIS — I83012 Varicose veins of right lower extremity with ulcer of calf: Secondary | ICD-10-CM | POA: Diagnosis not present

## 2016-05-18 DIAGNOSIS — Z794 Long term (current) use of insulin: Secondary | ICD-10-CM | POA: Diagnosis not present

## 2016-05-18 DIAGNOSIS — I482 Chronic atrial fibrillation: Secondary | ICD-10-CM | POA: Diagnosis not present

## 2016-05-18 DIAGNOSIS — L97911 Non-pressure chronic ulcer of unspecified part of right lower leg limited to breakdown of skin: Secondary | ICD-10-CM | POA: Diagnosis not present

## 2016-05-18 DIAGNOSIS — I130 Hypertensive heart and chronic kidney disease with heart failure and stage 1 through stage 4 chronic kidney disease, or unspecified chronic kidney disease: Secondary | ICD-10-CM | POA: Diagnosis not present

## 2016-05-18 DIAGNOSIS — E1122 Type 2 diabetes mellitus with diabetic chronic kidney disease: Secondary | ICD-10-CM | POA: Diagnosis not present

## 2016-05-18 DIAGNOSIS — I872 Venous insufficiency (chronic) (peripheral): Secondary | ICD-10-CM | POA: Diagnosis not present

## 2016-05-18 DIAGNOSIS — Z87891 Personal history of nicotine dependence: Secondary | ICD-10-CM | POA: Diagnosis not present

## 2016-05-18 DIAGNOSIS — E1140 Type 2 diabetes mellitus with diabetic neuropathy, unspecified: Secondary | ICD-10-CM | POA: Diagnosis not present

## 2016-05-18 DIAGNOSIS — M109 Gout, unspecified: Secondary | ICD-10-CM | POA: Diagnosis not present

## 2016-05-18 DIAGNOSIS — J449 Chronic obstructive pulmonary disease, unspecified: Secondary | ICD-10-CM | POA: Diagnosis not present

## 2016-05-23 DIAGNOSIS — I872 Venous insufficiency (chronic) (peripheral): Secondary | ICD-10-CM | POA: Diagnosis not present

## 2016-05-23 DIAGNOSIS — Z87891 Personal history of nicotine dependence: Secondary | ICD-10-CM | POA: Diagnosis not present

## 2016-05-23 DIAGNOSIS — E1140 Type 2 diabetes mellitus with diabetic neuropathy, unspecified: Secondary | ICD-10-CM | POA: Diagnosis not present

## 2016-05-23 DIAGNOSIS — I5032 Chronic diastolic (congestive) heart failure: Secondary | ICD-10-CM | POA: Diagnosis not present

## 2016-05-23 DIAGNOSIS — I130 Hypertensive heart and chronic kidney disease with heart failure and stage 1 through stage 4 chronic kidney disease, or unspecified chronic kidney disease: Secondary | ICD-10-CM | POA: Diagnosis not present

## 2016-05-23 DIAGNOSIS — Z9981 Dependence on supplemental oxygen: Secondary | ICD-10-CM | POA: Diagnosis not present

## 2016-05-23 DIAGNOSIS — E1122 Type 2 diabetes mellitus with diabetic chronic kidney disease: Secondary | ICD-10-CM | POA: Diagnosis not present

## 2016-05-23 DIAGNOSIS — Z7901 Long term (current) use of anticoagulants: Secondary | ICD-10-CM | POA: Diagnosis not present

## 2016-05-23 DIAGNOSIS — J449 Chronic obstructive pulmonary disease, unspecified: Secondary | ICD-10-CM | POA: Diagnosis not present

## 2016-05-23 DIAGNOSIS — Z794 Long term (current) use of insulin: Secondary | ICD-10-CM | POA: Diagnosis not present

## 2016-05-23 DIAGNOSIS — I482 Chronic atrial fibrillation: Secondary | ICD-10-CM | POA: Diagnosis not present

## 2016-05-23 DIAGNOSIS — C3490 Malignant neoplasm of unspecified part of unspecified bronchus or lung: Secondary | ICD-10-CM | POA: Diagnosis not present

## 2016-05-23 DIAGNOSIS — I83012 Varicose veins of right lower extremity with ulcer of calf: Secondary | ICD-10-CM | POA: Diagnosis not present

## 2016-05-25 DIAGNOSIS — C3490 Malignant neoplasm of unspecified part of unspecified bronchus or lung: Secondary | ICD-10-CM | POA: Diagnosis not present

## 2016-05-25 DIAGNOSIS — Z794 Long term (current) use of insulin: Secondary | ICD-10-CM | POA: Diagnosis not present

## 2016-05-25 DIAGNOSIS — Z7901 Long term (current) use of anticoagulants: Secondary | ICD-10-CM | POA: Diagnosis not present

## 2016-05-25 DIAGNOSIS — Z87891 Personal history of nicotine dependence: Secondary | ICD-10-CM | POA: Diagnosis not present

## 2016-05-25 DIAGNOSIS — E1140 Type 2 diabetes mellitus with diabetic neuropathy, unspecified: Secondary | ICD-10-CM | POA: Diagnosis not present

## 2016-05-25 DIAGNOSIS — I482 Chronic atrial fibrillation: Secondary | ICD-10-CM | POA: Diagnosis not present

## 2016-05-25 DIAGNOSIS — Z9981 Dependence on supplemental oxygen: Secondary | ICD-10-CM | POA: Diagnosis not present

## 2016-05-25 DIAGNOSIS — I5032 Chronic diastolic (congestive) heart failure: Secondary | ICD-10-CM | POA: Diagnosis not present

## 2016-05-25 DIAGNOSIS — I872 Venous insufficiency (chronic) (peripheral): Secondary | ICD-10-CM | POA: Diagnosis not present

## 2016-05-25 DIAGNOSIS — E1122 Type 2 diabetes mellitus with diabetic chronic kidney disease: Secondary | ICD-10-CM | POA: Diagnosis not present

## 2016-05-25 DIAGNOSIS — I83012 Varicose veins of right lower extremity with ulcer of calf: Secondary | ICD-10-CM | POA: Diagnosis not present

## 2016-05-25 DIAGNOSIS — J449 Chronic obstructive pulmonary disease, unspecified: Secondary | ICD-10-CM | POA: Diagnosis not present

## 2016-05-25 DIAGNOSIS — I130 Hypertensive heart and chronic kidney disease with heart failure and stage 1 through stage 4 chronic kidney disease, or unspecified chronic kidney disease: Secondary | ICD-10-CM | POA: Diagnosis not present

## 2016-05-26 DIAGNOSIS — B372 Candidiasis of skin and nail: Secondary | ICD-10-CM | POA: Diagnosis not present

## 2016-05-30 DIAGNOSIS — I482 Chronic atrial fibrillation: Secondary | ICD-10-CM | POA: Diagnosis not present

## 2016-05-30 DIAGNOSIS — Z9981 Dependence on supplemental oxygen: Secondary | ICD-10-CM | POA: Diagnosis not present

## 2016-05-30 DIAGNOSIS — I5032 Chronic diastolic (congestive) heart failure: Secondary | ICD-10-CM | POA: Diagnosis not present

## 2016-05-30 DIAGNOSIS — Z87891 Personal history of nicotine dependence: Secondary | ICD-10-CM | POA: Diagnosis not present

## 2016-05-30 DIAGNOSIS — I83012 Varicose veins of right lower extremity with ulcer of calf: Secondary | ICD-10-CM | POA: Diagnosis not present

## 2016-05-30 DIAGNOSIS — I872 Venous insufficiency (chronic) (peripheral): Secondary | ICD-10-CM | POA: Diagnosis not present

## 2016-05-30 DIAGNOSIS — Z7901 Long term (current) use of anticoagulants: Secondary | ICD-10-CM | POA: Diagnosis not present

## 2016-05-30 DIAGNOSIS — J449 Chronic obstructive pulmonary disease, unspecified: Secondary | ICD-10-CM | POA: Diagnosis not present

## 2016-05-30 DIAGNOSIS — E1122 Type 2 diabetes mellitus with diabetic chronic kidney disease: Secondary | ICD-10-CM | POA: Diagnosis not present

## 2016-05-30 DIAGNOSIS — Z794 Long term (current) use of insulin: Secondary | ICD-10-CM | POA: Diagnosis not present

## 2016-05-30 DIAGNOSIS — I130 Hypertensive heart and chronic kidney disease with heart failure and stage 1 through stage 4 chronic kidney disease, or unspecified chronic kidney disease: Secondary | ICD-10-CM | POA: Diagnosis not present

## 2016-05-30 DIAGNOSIS — C3490 Malignant neoplasm of unspecified part of unspecified bronchus or lung: Secondary | ICD-10-CM | POA: Diagnosis not present

## 2016-05-30 DIAGNOSIS — E1140 Type 2 diabetes mellitus with diabetic neuropathy, unspecified: Secondary | ICD-10-CM | POA: Diagnosis not present

## 2016-06-01 DIAGNOSIS — Z7901 Long term (current) use of anticoagulants: Secondary | ICD-10-CM | POA: Diagnosis not present

## 2016-06-01 DIAGNOSIS — E119 Type 2 diabetes mellitus without complications: Secondary | ICD-10-CM | POA: Diagnosis not present

## 2016-06-01 DIAGNOSIS — I5032 Chronic diastolic (congestive) heart failure: Secondary | ICD-10-CM | POA: Diagnosis not present

## 2016-06-01 DIAGNOSIS — C3490 Malignant neoplasm of unspecified part of unspecified bronchus or lung: Secondary | ICD-10-CM | POA: Diagnosis not present

## 2016-06-01 DIAGNOSIS — N183 Chronic kidney disease, stage 3 (moderate): Secondary | ICD-10-CM | POA: Diagnosis not present

## 2016-06-01 DIAGNOSIS — Z9981 Dependence on supplemental oxygen: Secondary | ICD-10-CM | POA: Diagnosis not present

## 2016-06-01 DIAGNOSIS — Z87891 Personal history of nicotine dependence: Secondary | ICD-10-CM | POA: Diagnosis not present

## 2016-06-01 DIAGNOSIS — B372 Candidiasis of skin and nail: Secondary | ICD-10-CM | POA: Diagnosis not present

## 2016-06-01 DIAGNOSIS — Z794 Long term (current) use of insulin: Secondary | ICD-10-CM | POA: Diagnosis not present

## 2016-06-01 DIAGNOSIS — I130 Hypertensive heart and chronic kidney disease with heart failure and stage 1 through stage 4 chronic kidney disease, or unspecified chronic kidney disease: Secondary | ICD-10-CM | POA: Diagnosis not present

## 2016-06-01 DIAGNOSIS — I482 Chronic atrial fibrillation: Secondary | ICD-10-CM | POA: Diagnosis not present

## 2016-06-01 DIAGNOSIS — E1140 Type 2 diabetes mellitus with diabetic neuropathy, unspecified: Secondary | ICD-10-CM | POA: Diagnosis not present

## 2016-06-01 DIAGNOSIS — J449 Chronic obstructive pulmonary disease, unspecified: Secondary | ICD-10-CM | POA: Diagnosis not present

## 2016-06-01 DIAGNOSIS — I872 Venous insufficiency (chronic) (peripheral): Secondary | ICD-10-CM | POA: Diagnosis not present

## 2016-06-01 DIAGNOSIS — E1122 Type 2 diabetes mellitus with diabetic chronic kidney disease: Secondary | ICD-10-CM | POA: Diagnosis not present

## 2016-06-01 DIAGNOSIS — I83012 Varicose veins of right lower extremity with ulcer of calf: Secondary | ICD-10-CM | POA: Diagnosis not present

## 2016-06-01 DIAGNOSIS — M109 Gout, unspecified: Secondary | ICD-10-CM | POA: Diagnosis not present

## 2016-06-01 DIAGNOSIS — E785 Hyperlipidemia, unspecified: Secondary | ICD-10-CM | POA: Diagnosis not present

## 2016-06-06 DIAGNOSIS — Z7901 Long term (current) use of anticoagulants: Secondary | ICD-10-CM | POA: Diagnosis not present

## 2016-06-06 DIAGNOSIS — J449 Chronic obstructive pulmonary disease, unspecified: Secondary | ICD-10-CM | POA: Diagnosis not present

## 2016-06-06 DIAGNOSIS — I482 Chronic atrial fibrillation: Secondary | ICD-10-CM | POA: Diagnosis not present

## 2016-06-06 DIAGNOSIS — Z87891 Personal history of nicotine dependence: Secondary | ICD-10-CM | POA: Diagnosis not present

## 2016-06-06 DIAGNOSIS — E1140 Type 2 diabetes mellitus with diabetic neuropathy, unspecified: Secondary | ICD-10-CM | POA: Diagnosis not present

## 2016-06-06 DIAGNOSIS — E1122 Type 2 diabetes mellitus with diabetic chronic kidney disease: Secondary | ICD-10-CM | POA: Diagnosis not present

## 2016-06-06 DIAGNOSIS — Z794 Long term (current) use of insulin: Secondary | ICD-10-CM | POA: Diagnosis not present

## 2016-06-06 DIAGNOSIS — I83012 Varicose veins of right lower extremity with ulcer of calf: Secondary | ICD-10-CM | POA: Diagnosis not present

## 2016-06-06 DIAGNOSIS — C3490 Malignant neoplasm of unspecified part of unspecified bronchus or lung: Secondary | ICD-10-CM | POA: Diagnosis not present

## 2016-06-06 DIAGNOSIS — I872 Venous insufficiency (chronic) (peripheral): Secondary | ICD-10-CM | POA: Diagnosis not present

## 2016-06-06 DIAGNOSIS — I5032 Chronic diastolic (congestive) heart failure: Secondary | ICD-10-CM | POA: Diagnosis not present

## 2016-06-06 DIAGNOSIS — I130 Hypertensive heart and chronic kidney disease with heart failure and stage 1 through stage 4 chronic kidney disease, or unspecified chronic kidney disease: Secondary | ICD-10-CM | POA: Diagnosis not present

## 2016-06-06 DIAGNOSIS — Z9981 Dependence on supplemental oxygen: Secondary | ICD-10-CM | POA: Diagnosis not present

## 2016-06-08 DIAGNOSIS — I482 Chronic atrial fibrillation: Secondary | ICD-10-CM | POA: Diagnosis not present

## 2016-06-08 DIAGNOSIS — I872 Venous insufficiency (chronic) (peripheral): Secondary | ICD-10-CM | POA: Diagnosis not present

## 2016-06-08 DIAGNOSIS — Z794 Long term (current) use of insulin: Secondary | ICD-10-CM | POA: Diagnosis not present

## 2016-06-08 DIAGNOSIS — I5032 Chronic diastolic (congestive) heart failure: Secondary | ICD-10-CM | POA: Diagnosis not present

## 2016-06-08 DIAGNOSIS — Z7901 Long term (current) use of anticoagulants: Secondary | ICD-10-CM | POA: Diagnosis not present

## 2016-06-08 DIAGNOSIS — Z87891 Personal history of nicotine dependence: Secondary | ICD-10-CM | POA: Diagnosis not present

## 2016-06-08 DIAGNOSIS — E1122 Type 2 diabetes mellitus with diabetic chronic kidney disease: Secondary | ICD-10-CM | POA: Diagnosis not present

## 2016-06-08 DIAGNOSIS — E1140 Type 2 diabetes mellitus with diabetic neuropathy, unspecified: Secondary | ICD-10-CM | POA: Diagnosis not present

## 2016-06-08 DIAGNOSIS — C3490 Malignant neoplasm of unspecified part of unspecified bronchus or lung: Secondary | ICD-10-CM | POA: Diagnosis not present

## 2016-06-08 DIAGNOSIS — I83012 Varicose veins of right lower extremity with ulcer of calf: Secondary | ICD-10-CM | POA: Diagnosis not present

## 2016-06-08 DIAGNOSIS — J449 Chronic obstructive pulmonary disease, unspecified: Secondary | ICD-10-CM | POA: Diagnosis not present

## 2016-06-08 DIAGNOSIS — Z9981 Dependence on supplemental oxygen: Secondary | ICD-10-CM | POA: Diagnosis not present

## 2016-06-08 DIAGNOSIS — I130 Hypertensive heart and chronic kidney disease with heart failure and stage 1 through stage 4 chronic kidney disease, or unspecified chronic kidney disease: Secondary | ICD-10-CM | POA: Diagnosis not present

## 2016-06-12 DIAGNOSIS — R269 Unspecified abnormalities of gait and mobility: Secondary | ICD-10-CM | POA: Diagnosis not present

## 2016-06-12 DIAGNOSIS — I10 Essential (primary) hypertension: Secondary | ICD-10-CM | POA: Diagnosis not present

## 2016-06-12 DIAGNOSIS — R413 Other amnesia: Secondary | ICD-10-CM | POA: Diagnosis not present

## 2016-06-14 DIAGNOSIS — Z87891 Personal history of nicotine dependence: Secondary | ICD-10-CM | POA: Diagnosis not present

## 2016-06-14 DIAGNOSIS — J449 Chronic obstructive pulmonary disease, unspecified: Secondary | ICD-10-CM | POA: Diagnosis not present

## 2016-06-14 DIAGNOSIS — E1140 Type 2 diabetes mellitus with diabetic neuropathy, unspecified: Secondary | ICD-10-CM | POA: Diagnosis not present

## 2016-06-14 DIAGNOSIS — I872 Venous insufficiency (chronic) (peripheral): Secondary | ICD-10-CM | POA: Diagnosis not present

## 2016-06-14 DIAGNOSIS — I5032 Chronic diastolic (congestive) heart failure: Secondary | ICD-10-CM | POA: Diagnosis not present

## 2016-06-14 DIAGNOSIS — E1122 Type 2 diabetes mellitus with diabetic chronic kidney disease: Secondary | ICD-10-CM | POA: Diagnosis not present

## 2016-06-14 DIAGNOSIS — I83012 Varicose veins of right lower extremity with ulcer of calf: Secondary | ICD-10-CM | POA: Diagnosis not present

## 2016-06-14 DIAGNOSIS — C3490 Malignant neoplasm of unspecified part of unspecified bronchus or lung: Secondary | ICD-10-CM | POA: Diagnosis not present

## 2016-06-14 DIAGNOSIS — Z9981 Dependence on supplemental oxygen: Secondary | ICD-10-CM | POA: Diagnosis not present

## 2016-06-14 DIAGNOSIS — I482 Chronic atrial fibrillation: Secondary | ICD-10-CM | POA: Diagnosis not present

## 2016-06-14 DIAGNOSIS — Z7901 Long term (current) use of anticoagulants: Secondary | ICD-10-CM | POA: Diagnosis not present

## 2016-06-14 DIAGNOSIS — Z794 Long term (current) use of insulin: Secondary | ICD-10-CM | POA: Diagnosis not present

## 2016-06-14 DIAGNOSIS — I130 Hypertensive heart and chronic kidney disease with heart failure and stage 1 through stage 4 chronic kidney disease, or unspecified chronic kidney disease: Secondary | ICD-10-CM | POA: Diagnosis not present

## 2016-06-15 DIAGNOSIS — I5032 Chronic diastolic (congestive) heart failure: Secondary | ICD-10-CM | POA: Diagnosis not present

## 2016-06-15 DIAGNOSIS — E1140 Type 2 diabetes mellitus with diabetic neuropathy, unspecified: Secondary | ICD-10-CM | POA: Diagnosis not present

## 2016-06-15 DIAGNOSIS — Z9981 Dependence on supplemental oxygen: Secondary | ICD-10-CM | POA: Diagnosis not present

## 2016-06-15 DIAGNOSIS — I83012 Varicose veins of right lower extremity with ulcer of calf: Secondary | ICD-10-CM | POA: Diagnosis not present

## 2016-06-15 DIAGNOSIS — Z7901 Long term (current) use of anticoagulants: Secondary | ICD-10-CM | POA: Diagnosis not present

## 2016-06-15 DIAGNOSIS — C3490 Malignant neoplasm of unspecified part of unspecified bronchus or lung: Secondary | ICD-10-CM | POA: Diagnosis not present

## 2016-06-15 DIAGNOSIS — E1122 Type 2 diabetes mellitus with diabetic chronic kidney disease: Secondary | ICD-10-CM | POA: Diagnosis not present

## 2016-06-15 DIAGNOSIS — I130 Hypertensive heart and chronic kidney disease with heart failure and stage 1 through stage 4 chronic kidney disease, or unspecified chronic kidney disease: Secondary | ICD-10-CM | POA: Diagnosis not present

## 2016-06-15 DIAGNOSIS — I872 Venous insufficiency (chronic) (peripheral): Secondary | ICD-10-CM | POA: Diagnosis not present

## 2016-06-15 DIAGNOSIS — J449 Chronic obstructive pulmonary disease, unspecified: Secondary | ICD-10-CM | POA: Diagnosis not present

## 2016-06-15 DIAGNOSIS — Z87891 Personal history of nicotine dependence: Secondary | ICD-10-CM | POA: Diagnosis not present

## 2016-06-15 DIAGNOSIS — I482 Chronic atrial fibrillation: Secondary | ICD-10-CM | POA: Diagnosis not present

## 2016-06-15 DIAGNOSIS — Z794 Long term (current) use of insulin: Secondary | ICD-10-CM | POA: Diagnosis not present

## 2016-06-20 DIAGNOSIS — I872 Venous insufficiency (chronic) (peripheral): Secondary | ICD-10-CM | POA: Diagnosis not present

## 2016-06-20 DIAGNOSIS — E1140 Type 2 diabetes mellitus with diabetic neuropathy, unspecified: Secondary | ICD-10-CM | POA: Diagnosis not present

## 2016-06-20 DIAGNOSIS — Z87891 Personal history of nicotine dependence: Secondary | ICD-10-CM | POA: Diagnosis not present

## 2016-06-20 DIAGNOSIS — Z794 Long term (current) use of insulin: Secondary | ICD-10-CM | POA: Diagnosis not present

## 2016-06-20 DIAGNOSIS — Z9981 Dependence on supplemental oxygen: Secondary | ICD-10-CM | POA: Diagnosis not present

## 2016-06-20 DIAGNOSIS — I482 Chronic atrial fibrillation: Secondary | ICD-10-CM | POA: Diagnosis not present

## 2016-06-20 DIAGNOSIS — I83012 Varicose veins of right lower extremity with ulcer of calf: Secondary | ICD-10-CM | POA: Diagnosis not present

## 2016-06-20 DIAGNOSIS — E1122 Type 2 diabetes mellitus with diabetic chronic kidney disease: Secondary | ICD-10-CM | POA: Diagnosis not present

## 2016-06-20 DIAGNOSIS — C3490 Malignant neoplasm of unspecified part of unspecified bronchus or lung: Secondary | ICD-10-CM | POA: Diagnosis not present

## 2016-06-20 DIAGNOSIS — I5032 Chronic diastolic (congestive) heart failure: Secondary | ICD-10-CM | POA: Diagnosis not present

## 2016-06-20 DIAGNOSIS — I130 Hypertensive heart and chronic kidney disease with heart failure and stage 1 through stage 4 chronic kidney disease, or unspecified chronic kidney disease: Secondary | ICD-10-CM | POA: Diagnosis not present

## 2016-06-20 DIAGNOSIS — Z7901 Long term (current) use of anticoagulants: Secondary | ICD-10-CM | POA: Diagnosis not present

## 2016-06-20 DIAGNOSIS — J449 Chronic obstructive pulmonary disease, unspecified: Secondary | ICD-10-CM | POA: Diagnosis not present

## 2016-06-22 DIAGNOSIS — I482 Chronic atrial fibrillation: Secondary | ICD-10-CM | POA: Diagnosis not present

## 2016-06-22 DIAGNOSIS — J449 Chronic obstructive pulmonary disease, unspecified: Secondary | ICD-10-CM | POA: Diagnosis not present

## 2016-06-22 DIAGNOSIS — I5032 Chronic diastolic (congestive) heart failure: Secondary | ICD-10-CM | POA: Diagnosis not present

## 2016-06-22 DIAGNOSIS — I872 Venous insufficiency (chronic) (peripheral): Secondary | ICD-10-CM | POA: Diagnosis not present

## 2016-06-22 DIAGNOSIS — E1140 Type 2 diabetes mellitus with diabetic neuropathy, unspecified: Secondary | ICD-10-CM | POA: Diagnosis not present

## 2016-06-22 DIAGNOSIS — Z9981 Dependence on supplemental oxygen: Secondary | ICD-10-CM | POA: Diagnosis not present

## 2016-06-22 DIAGNOSIS — I130 Hypertensive heart and chronic kidney disease with heart failure and stage 1 through stage 4 chronic kidney disease, or unspecified chronic kidney disease: Secondary | ICD-10-CM | POA: Diagnosis not present

## 2016-06-22 DIAGNOSIS — C3490 Malignant neoplasm of unspecified part of unspecified bronchus or lung: Secondary | ICD-10-CM | POA: Diagnosis not present

## 2016-06-22 DIAGNOSIS — I83012 Varicose veins of right lower extremity with ulcer of calf: Secondary | ICD-10-CM | POA: Diagnosis not present

## 2016-06-22 DIAGNOSIS — E1122 Type 2 diabetes mellitus with diabetic chronic kidney disease: Secondary | ICD-10-CM | POA: Diagnosis not present

## 2016-06-22 DIAGNOSIS — Z7901 Long term (current) use of anticoagulants: Secondary | ICD-10-CM | POA: Diagnosis not present

## 2016-06-22 DIAGNOSIS — Z87891 Personal history of nicotine dependence: Secondary | ICD-10-CM | POA: Diagnosis not present

## 2016-06-22 DIAGNOSIS — Z794 Long term (current) use of insulin: Secondary | ICD-10-CM | POA: Diagnosis not present

## 2016-06-27 DIAGNOSIS — Z9981 Dependence on supplemental oxygen: Secondary | ICD-10-CM | POA: Diagnosis not present

## 2016-06-27 DIAGNOSIS — I482 Chronic atrial fibrillation: Secondary | ICD-10-CM | POA: Diagnosis not present

## 2016-06-27 DIAGNOSIS — E1140 Type 2 diabetes mellitus with diabetic neuropathy, unspecified: Secondary | ICD-10-CM | POA: Diagnosis not present

## 2016-06-27 DIAGNOSIS — I5032 Chronic diastolic (congestive) heart failure: Secondary | ICD-10-CM | POA: Diagnosis not present

## 2016-06-27 DIAGNOSIS — I130 Hypertensive heart and chronic kidney disease with heart failure and stage 1 through stage 4 chronic kidney disease, or unspecified chronic kidney disease: Secondary | ICD-10-CM | POA: Diagnosis not present

## 2016-06-27 DIAGNOSIS — Z7901 Long term (current) use of anticoagulants: Secondary | ICD-10-CM | POA: Diagnosis not present

## 2016-06-27 DIAGNOSIS — Z794 Long term (current) use of insulin: Secondary | ICD-10-CM | POA: Diagnosis not present

## 2016-06-27 DIAGNOSIS — C3490 Malignant neoplasm of unspecified part of unspecified bronchus or lung: Secondary | ICD-10-CM | POA: Diagnosis not present

## 2016-06-27 DIAGNOSIS — E1122 Type 2 diabetes mellitus with diabetic chronic kidney disease: Secondary | ICD-10-CM | POA: Diagnosis not present

## 2016-06-27 DIAGNOSIS — I83012 Varicose veins of right lower extremity with ulcer of calf: Secondary | ICD-10-CM | POA: Diagnosis not present

## 2016-06-27 DIAGNOSIS — J449 Chronic obstructive pulmonary disease, unspecified: Secondary | ICD-10-CM | POA: Diagnosis not present

## 2016-06-27 DIAGNOSIS — Z87891 Personal history of nicotine dependence: Secondary | ICD-10-CM | POA: Diagnosis not present

## 2016-06-27 DIAGNOSIS — I872 Venous insufficiency (chronic) (peripheral): Secondary | ICD-10-CM | POA: Diagnosis not present

## 2016-06-29 DIAGNOSIS — I83012 Varicose veins of right lower extremity with ulcer of calf: Secondary | ICD-10-CM | POA: Diagnosis not present

## 2016-06-29 DIAGNOSIS — Z7901 Long term (current) use of anticoagulants: Secondary | ICD-10-CM | POA: Diagnosis not present

## 2016-06-29 DIAGNOSIS — I872 Venous insufficiency (chronic) (peripheral): Secondary | ICD-10-CM | POA: Diagnosis not present

## 2016-06-29 DIAGNOSIS — C3490 Malignant neoplasm of unspecified part of unspecified bronchus or lung: Secondary | ICD-10-CM | POA: Diagnosis not present

## 2016-06-29 DIAGNOSIS — E1140 Type 2 diabetes mellitus with diabetic neuropathy, unspecified: Secondary | ICD-10-CM | POA: Diagnosis not present

## 2016-06-29 DIAGNOSIS — I482 Chronic atrial fibrillation: Secondary | ICD-10-CM | POA: Diagnosis not present

## 2016-06-29 DIAGNOSIS — Z9981 Dependence on supplemental oxygen: Secondary | ICD-10-CM | POA: Diagnosis not present

## 2016-06-29 DIAGNOSIS — Z87891 Personal history of nicotine dependence: Secondary | ICD-10-CM | POA: Diagnosis not present

## 2016-06-29 DIAGNOSIS — I130 Hypertensive heart and chronic kidney disease with heart failure and stage 1 through stage 4 chronic kidney disease, or unspecified chronic kidney disease: Secondary | ICD-10-CM | POA: Diagnosis not present

## 2016-06-29 DIAGNOSIS — I5032 Chronic diastolic (congestive) heart failure: Secondary | ICD-10-CM | POA: Diagnosis not present

## 2016-06-29 DIAGNOSIS — Z794 Long term (current) use of insulin: Secondary | ICD-10-CM | POA: Diagnosis not present

## 2016-06-29 DIAGNOSIS — J449 Chronic obstructive pulmonary disease, unspecified: Secondary | ICD-10-CM | POA: Diagnosis not present

## 2016-06-29 DIAGNOSIS — E1122 Type 2 diabetes mellitus with diabetic chronic kidney disease: Secondary | ICD-10-CM | POA: Diagnosis not present

## 2016-07-03 DIAGNOSIS — I4891 Unspecified atrial fibrillation: Secondary | ICD-10-CM | POA: Diagnosis not present

## 2016-07-03 DIAGNOSIS — J449 Chronic obstructive pulmonary disease, unspecified: Secondary | ICD-10-CM | POA: Diagnosis not present

## 2016-07-03 DIAGNOSIS — N189 Chronic kidney disease, unspecified: Secondary | ICD-10-CM | POA: Diagnosis not present

## 2016-07-03 DIAGNOSIS — I83009 Varicose veins of unspecified lower extremity with ulcer of unspecified site: Secondary | ICD-10-CM | POA: Diagnosis not present

## 2016-07-03 DIAGNOSIS — M109 Gout, unspecified: Secondary | ICD-10-CM | POA: Diagnosis not present

## 2016-07-11 DIAGNOSIS — I83009 Varicose veins of unspecified lower extremity with ulcer of unspecified site: Secondary | ICD-10-CM | POA: Diagnosis not present

## 2016-07-11 DIAGNOSIS — I50810 Right heart failure, unspecified: Secondary | ICD-10-CM | POA: Diagnosis not present

## 2016-07-11 DIAGNOSIS — M109 Gout, unspecified: Secondary | ICD-10-CM | POA: Diagnosis not present

## 2016-07-11 DIAGNOSIS — E119 Type 2 diabetes mellitus without complications: Secondary | ICD-10-CM | POA: Diagnosis not present

## 2016-07-11 DIAGNOSIS — I4891 Unspecified atrial fibrillation: Secondary | ICD-10-CM | POA: Diagnosis not present

## 2016-07-11 DIAGNOSIS — R413 Other amnesia: Secondary | ICD-10-CM | POA: Diagnosis not present

## 2016-07-11 DIAGNOSIS — D500 Iron deficiency anemia secondary to blood loss (chronic): Secondary | ICD-10-CM | POA: Diagnosis not present

## 2016-07-19 DIAGNOSIS — M109 Gout, unspecified: Secondary | ICD-10-CM | POA: Diagnosis not present

## 2016-07-19 DIAGNOSIS — K210 Gastro-esophageal reflux disease with esophagitis: Secondary | ICD-10-CM | POA: Diagnosis not present

## 2016-07-19 DIAGNOSIS — I50810 Right heart failure, unspecified: Secondary | ICD-10-CM | POA: Diagnosis not present

## 2016-07-19 DIAGNOSIS — I83009 Varicose veins of unspecified lower extremity with ulcer of unspecified site: Secondary | ICD-10-CM | POA: Diagnosis not present

## 2016-07-19 DIAGNOSIS — D649 Anemia, unspecified: Secondary | ICD-10-CM | POA: Diagnosis not present

## 2016-07-20 DIAGNOSIS — I482 Chronic atrial fibrillation: Secondary | ICD-10-CM | POA: Diagnosis not present

## 2016-07-20 DIAGNOSIS — I130 Hypertensive heart and chronic kidney disease with heart failure and stage 1 through stage 4 chronic kidney disease, or unspecified chronic kidney disease: Secondary | ICD-10-CM | POA: Diagnosis not present

## 2016-07-20 DIAGNOSIS — Z9981 Dependence on supplemental oxygen: Secondary | ICD-10-CM | POA: Diagnosis not present

## 2016-07-20 DIAGNOSIS — I872 Venous insufficiency (chronic) (peripheral): Secondary | ICD-10-CM | POA: Diagnosis not present

## 2016-07-20 DIAGNOSIS — C3490 Malignant neoplasm of unspecified part of unspecified bronchus or lung: Secondary | ICD-10-CM | POA: Diagnosis not present

## 2016-07-20 DIAGNOSIS — L97921 Non-pressure chronic ulcer of unspecified part of left lower leg limited to breakdown of skin: Secondary | ICD-10-CM | POA: Diagnosis not present

## 2016-07-20 DIAGNOSIS — D519 Vitamin B12 deficiency anemia, unspecified: Secondary | ICD-10-CM | POA: Diagnosis not present

## 2016-07-20 DIAGNOSIS — E1122 Type 2 diabetes mellitus with diabetic chronic kidney disease: Secondary | ICD-10-CM | POA: Diagnosis not present

## 2016-07-20 DIAGNOSIS — I5032 Chronic diastolic (congestive) heart failure: Secondary | ICD-10-CM | POA: Diagnosis not present

## 2016-07-20 DIAGNOSIS — J449 Chronic obstructive pulmonary disease, unspecified: Secondary | ICD-10-CM | POA: Diagnosis not present

## 2016-07-20 DIAGNOSIS — E1140 Type 2 diabetes mellitus with diabetic neuropathy, unspecified: Secondary | ICD-10-CM | POA: Diagnosis not present

## 2016-07-20 DIAGNOSIS — K210 Gastro-esophageal reflux disease with esophagitis: Secondary | ICD-10-CM | POA: Diagnosis not present

## 2016-07-20 DIAGNOSIS — Z602 Problems related to living alone: Secondary | ICD-10-CM | POA: Diagnosis not present

## 2016-07-20 DIAGNOSIS — Z794 Long term (current) use of insulin: Secondary | ICD-10-CM | POA: Diagnosis not present

## 2016-07-20 DIAGNOSIS — Z87891 Personal history of nicotine dependence: Secondary | ICD-10-CM | POA: Diagnosis not present

## 2016-07-20 DIAGNOSIS — N189 Chronic kidney disease, unspecified: Secondary | ICD-10-CM | POA: Diagnosis not present

## 2016-07-20 DIAGNOSIS — Z7901 Long term (current) use of anticoagulants: Secondary | ICD-10-CM | POA: Diagnosis not present

## 2016-07-25 DIAGNOSIS — N189 Chronic kidney disease, unspecified: Secondary | ICD-10-CM | POA: Diagnosis not present

## 2016-07-25 DIAGNOSIS — Z9981 Dependence on supplemental oxygen: Secondary | ICD-10-CM | POA: Diagnosis not present

## 2016-07-25 DIAGNOSIS — E1140 Type 2 diabetes mellitus with diabetic neuropathy, unspecified: Secondary | ICD-10-CM | POA: Diagnosis not present

## 2016-07-25 DIAGNOSIS — Z602 Problems related to living alone: Secondary | ICD-10-CM | POA: Diagnosis not present

## 2016-07-25 DIAGNOSIS — I872 Venous insufficiency (chronic) (peripheral): Secondary | ICD-10-CM | POA: Diagnosis not present

## 2016-07-25 DIAGNOSIS — D519 Vitamin B12 deficiency anemia, unspecified: Secondary | ICD-10-CM | POA: Diagnosis not present

## 2016-07-25 DIAGNOSIS — Z7901 Long term (current) use of anticoagulants: Secondary | ICD-10-CM | POA: Diagnosis not present

## 2016-07-25 DIAGNOSIS — L97921 Non-pressure chronic ulcer of unspecified part of left lower leg limited to breakdown of skin: Secondary | ICD-10-CM | POA: Diagnosis not present

## 2016-07-25 DIAGNOSIS — K210 Gastro-esophageal reflux disease with esophagitis: Secondary | ICD-10-CM | POA: Diagnosis not present

## 2016-07-25 DIAGNOSIS — I5032 Chronic diastolic (congestive) heart failure: Secondary | ICD-10-CM | POA: Diagnosis not present

## 2016-07-25 DIAGNOSIS — J449 Chronic obstructive pulmonary disease, unspecified: Secondary | ICD-10-CM | POA: Diagnosis not present

## 2016-07-25 DIAGNOSIS — Z87891 Personal history of nicotine dependence: Secondary | ICD-10-CM | POA: Diagnosis not present

## 2016-07-25 DIAGNOSIS — I130 Hypertensive heart and chronic kidney disease with heart failure and stage 1 through stage 4 chronic kidney disease, or unspecified chronic kidney disease: Secondary | ICD-10-CM | POA: Diagnosis not present

## 2016-07-25 DIAGNOSIS — Z794 Long term (current) use of insulin: Secondary | ICD-10-CM | POA: Diagnosis not present

## 2016-07-25 DIAGNOSIS — E1122 Type 2 diabetes mellitus with diabetic chronic kidney disease: Secondary | ICD-10-CM | POA: Diagnosis not present

## 2016-07-25 DIAGNOSIS — I482 Chronic atrial fibrillation: Secondary | ICD-10-CM | POA: Diagnosis not present

## 2016-07-25 DIAGNOSIS — C3490 Malignant neoplasm of unspecified part of unspecified bronchus or lung: Secondary | ICD-10-CM | POA: Diagnosis not present

## 2016-07-27 DIAGNOSIS — J449 Chronic obstructive pulmonary disease, unspecified: Secondary | ICD-10-CM | POA: Diagnosis not present

## 2016-07-27 DIAGNOSIS — C3490 Malignant neoplasm of unspecified part of unspecified bronchus or lung: Secondary | ICD-10-CM | POA: Diagnosis not present

## 2016-07-27 DIAGNOSIS — Z7901 Long term (current) use of anticoagulants: Secondary | ICD-10-CM | POA: Diagnosis not present

## 2016-07-27 DIAGNOSIS — E1122 Type 2 diabetes mellitus with diabetic chronic kidney disease: Secondary | ICD-10-CM | POA: Diagnosis not present

## 2016-07-27 DIAGNOSIS — Z9981 Dependence on supplemental oxygen: Secondary | ICD-10-CM | POA: Diagnosis not present

## 2016-07-27 DIAGNOSIS — D519 Vitamin B12 deficiency anemia, unspecified: Secondary | ICD-10-CM | POA: Diagnosis not present

## 2016-07-27 DIAGNOSIS — Z794 Long term (current) use of insulin: Secondary | ICD-10-CM | POA: Diagnosis not present

## 2016-07-27 DIAGNOSIS — L97921 Non-pressure chronic ulcer of unspecified part of left lower leg limited to breakdown of skin: Secondary | ICD-10-CM | POA: Diagnosis not present

## 2016-07-27 DIAGNOSIS — Z87891 Personal history of nicotine dependence: Secondary | ICD-10-CM | POA: Diagnosis not present

## 2016-07-27 DIAGNOSIS — N189 Chronic kidney disease, unspecified: Secondary | ICD-10-CM | POA: Diagnosis not present

## 2016-07-27 DIAGNOSIS — K210 Gastro-esophageal reflux disease with esophagitis: Secondary | ICD-10-CM | POA: Diagnosis not present

## 2016-07-27 DIAGNOSIS — I482 Chronic atrial fibrillation: Secondary | ICD-10-CM | POA: Diagnosis not present

## 2016-07-27 DIAGNOSIS — Z602 Problems related to living alone: Secondary | ICD-10-CM | POA: Diagnosis not present

## 2016-07-27 DIAGNOSIS — E1140 Type 2 diabetes mellitus with diabetic neuropathy, unspecified: Secondary | ICD-10-CM | POA: Diagnosis not present

## 2016-07-27 DIAGNOSIS — I5032 Chronic diastolic (congestive) heart failure: Secondary | ICD-10-CM | POA: Diagnosis not present

## 2016-07-27 DIAGNOSIS — I130 Hypertensive heart and chronic kidney disease with heart failure and stage 1 through stage 4 chronic kidney disease, or unspecified chronic kidney disease: Secondary | ICD-10-CM | POA: Diagnosis not present

## 2016-07-27 DIAGNOSIS — I872 Venous insufficiency (chronic) (peripheral): Secondary | ICD-10-CM | POA: Diagnosis not present

## 2016-07-30 DIAGNOSIS — J449 Chronic obstructive pulmonary disease, unspecified: Secondary | ICD-10-CM | POA: Diagnosis not present

## 2016-08-01 DIAGNOSIS — I482 Chronic atrial fibrillation: Secondary | ICD-10-CM | POA: Diagnosis not present

## 2016-08-01 DIAGNOSIS — Z602 Problems related to living alone: Secondary | ICD-10-CM | POA: Diagnosis not present

## 2016-08-01 DIAGNOSIS — Z7901 Long term (current) use of anticoagulants: Secondary | ICD-10-CM | POA: Diagnosis not present

## 2016-08-01 DIAGNOSIS — I872 Venous insufficiency (chronic) (peripheral): Secondary | ICD-10-CM | POA: Diagnosis not present

## 2016-08-01 DIAGNOSIS — I5032 Chronic diastolic (congestive) heart failure: Secondary | ICD-10-CM | POA: Diagnosis not present

## 2016-08-01 DIAGNOSIS — E1122 Type 2 diabetes mellitus with diabetic chronic kidney disease: Secondary | ICD-10-CM | POA: Diagnosis not present

## 2016-08-01 DIAGNOSIS — E1140 Type 2 diabetes mellitus with diabetic neuropathy, unspecified: Secondary | ICD-10-CM | POA: Diagnosis not present

## 2016-08-01 DIAGNOSIS — J449 Chronic obstructive pulmonary disease, unspecified: Secondary | ICD-10-CM | POA: Diagnosis not present

## 2016-08-01 DIAGNOSIS — D519 Vitamin B12 deficiency anemia, unspecified: Secondary | ICD-10-CM | POA: Diagnosis not present

## 2016-08-01 DIAGNOSIS — Z794 Long term (current) use of insulin: Secondary | ICD-10-CM | POA: Diagnosis not present

## 2016-08-01 DIAGNOSIS — N189 Chronic kidney disease, unspecified: Secondary | ICD-10-CM | POA: Diagnosis not present

## 2016-08-01 DIAGNOSIS — L97921 Non-pressure chronic ulcer of unspecified part of left lower leg limited to breakdown of skin: Secondary | ICD-10-CM | POA: Diagnosis not present

## 2016-08-01 DIAGNOSIS — Z87891 Personal history of nicotine dependence: Secondary | ICD-10-CM | POA: Diagnosis not present

## 2016-08-01 DIAGNOSIS — K210 Gastro-esophageal reflux disease with esophagitis: Secondary | ICD-10-CM | POA: Diagnosis not present

## 2016-08-01 DIAGNOSIS — C3490 Malignant neoplasm of unspecified part of unspecified bronchus or lung: Secondary | ICD-10-CM | POA: Diagnosis not present

## 2016-08-01 DIAGNOSIS — I130 Hypertensive heart and chronic kidney disease with heart failure and stage 1 through stage 4 chronic kidney disease, or unspecified chronic kidney disease: Secondary | ICD-10-CM | POA: Diagnosis not present

## 2016-08-01 DIAGNOSIS — Z9981 Dependence on supplemental oxygen: Secondary | ICD-10-CM | POA: Diagnosis not present

## 2016-08-03 DIAGNOSIS — J449 Chronic obstructive pulmonary disease, unspecified: Secondary | ICD-10-CM | POA: Diagnosis not present

## 2016-08-03 DIAGNOSIS — I5032 Chronic diastolic (congestive) heart failure: Secondary | ICD-10-CM | POA: Diagnosis not present

## 2016-08-03 DIAGNOSIS — L97921 Non-pressure chronic ulcer of unspecified part of left lower leg limited to breakdown of skin: Secondary | ICD-10-CM | POA: Diagnosis not present

## 2016-08-03 DIAGNOSIS — Z602 Problems related to living alone: Secondary | ICD-10-CM | POA: Diagnosis not present

## 2016-08-03 DIAGNOSIS — Z9981 Dependence on supplemental oxygen: Secondary | ICD-10-CM | POA: Diagnosis not present

## 2016-08-03 DIAGNOSIS — D519 Vitamin B12 deficiency anemia, unspecified: Secondary | ICD-10-CM | POA: Diagnosis not present

## 2016-08-03 DIAGNOSIS — E1140 Type 2 diabetes mellitus with diabetic neuropathy, unspecified: Secondary | ICD-10-CM | POA: Diagnosis not present

## 2016-08-03 DIAGNOSIS — C3490 Malignant neoplasm of unspecified part of unspecified bronchus or lung: Secondary | ICD-10-CM | POA: Diagnosis not present

## 2016-08-03 DIAGNOSIS — K210 Gastro-esophageal reflux disease with esophagitis: Secondary | ICD-10-CM | POA: Diagnosis not present

## 2016-08-03 DIAGNOSIS — I482 Chronic atrial fibrillation: Secondary | ICD-10-CM | POA: Diagnosis not present

## 2016-08-03 DIAGNOSIS — Z794 Long term (current) use of insulin: Secondary | ICD-10-CM | POA: Diagnosis not present

## 2016-08-03 DIAGNOSIS — E1122 Type 2 diabetes mellitus with diabetic chronic kidney disease: Secondary | ICD-10-CM | POA: Diagnosis not present

## 2016-08-03 DIAGNOSIS — Z7901 Long term (current) use of anticoagulants: Secondary | ICD-10-CM | POA: Diagnosis not present

## 2016-08-03 DIAGNOSIS — I872 Venous insufficiency (chronic) (peripheral): Secondary | ICD-10-CM | POA: Diagnosis not present

## 2016-08-03 DIAGNOSIS — Z87891 Personal history of nicotine dependence: Secondary | ICD-10-CM | POA: Diagnosis not present

## 2016-08-03 DIAGNOSIS — N189 Chronic kidney disease, unspecified: Secondary | ICD-10-CM | POA: Diagnosis not present

## 2016-08-03 DIAGNOSIS — I130 Hypertensive heart and chronic kidney disease with heart failure and stage 1 through stage 4 chronic kidney disease, or unspecified chronic kidney disease: Secondary | ICD-10-CM | POA: Diagnosis not present

## 2016-08-08 DIAGNOSIS — E1122 Type 2 diabetes mellitus with diabetic chronic kidney disease: Secondary | ICD-10-CM | POA: Diagnosis not present

## 2016-08-08 DIAGNOSIS — C3490 Malignant neoplasm of unspecified part of unspecified bronchus or lung: Secondary | ICD-10-CM | POA: Diagnosis not present

## 2016-08-08 DIAGNOSIS — Z794 Long term (current) use of insulin: Secondary | ICD-10-CM | POA: Diagnosis not present

## 2016-08-08 DIAGNOSIS — K210 Gastro-esophageal reflux disease with esophagitis: Secondary | ICD-10-CM | POA: Diagnosis not present

## 2016-08-08 DIAGNOSIS — E1140 Type 2 diabetes mellitus with diabetic neuropathy, unspecified: Secondary | ICD-10-CM | POA: Diagnosis not present

## 2016-08-08 DIAGNOSIS — N189 Chronic kidney disease, unspecified: Secondary | ICD-10-CM | POA: Diagnosis not present

## 2016-08-08 DIAGNOSIS — I5032 Chronic diastolic (congestive) heart failure: Secondary | ICD-10-CM | POA: Diagnosis not present

## 2016-08-08 DIAGNOSIS — I482 Chronic atrial fibrillation: Secondary | ICD-10-CM | POA: Diagnosis not present

## 2016-08-08 DIAGNOSIS — Z602 Problems related to living alone: Secondary | ICD-10-CM | POA: Diagnosis not present

## 2016-08-08 DIAGNOSIS — Z7901 Long term (current) use of anticoagulants: Secondary | ICD-10-CM | POA: Diagnosis not present

## 2016-08-08 DIAGNOSIS — D519 Vitamin B12 deficiency anemia, unspecified: Secondary | ICD-10-CM | POA: Diagnosis not present

## 2016-08-08 DIAGNOSIS — L97921 Non-pressure chronic ulcer of unspecified part of left lower leg limited to breakdown of skin: Secondary | ICD-10-CM | POA: Diagnosis not present

## 2016-08-08 DIAGNOSIS — Z9981 Dependence on supplemental oxygen: Secondary | ICD-10-CM | POA: Diagnosis not present

## 2016-08-08 DIAGNOSIS — I872 Venous insufficiency (chronic) (peripheral): Secondary | ICD-10-CM | POA: Diagnosis not present

## 2016-08-08 DIAGNOSIS — Z87891 Personal history of nicotine dependence: Secondary | ICD-10-CM | POA: Diagnosis not present

## 2016-08-08 DIAGNOSIS — I130 Hypertensive heart and chronic kidney disease with heart failure and stage 1 through stage 4 chronic kidney disease, or unspecified chronic kidney disease: Secondary | ICD-10-CM | POA: Diagnosis not present

## 2016-08-08 DIAGNOSIS — J449 Chronic obstructive pulmonary disease, unspecified: Secondary | ICD-10-CM | POA: Diagnosis not present

## 2016-08-10 DIAGNOSIS — I482 Chronic atrial fibrillation: Secondary | ICD-10-CM | POA: Diagnosis not present

## 2016-08-10 DIAGNOSIS — Z602 Problems related to living alone: Secondary | ICD-10-CM | POA: Diagnosis not present

## 2016-08-10 DIAGNOSIS — J449 Chronic obstructive pulmonary disease, unspecified: Secondary | ICD-10-CM | POA: Diagnosis not present

## 2016-08-10 DIAGNOSIS — Z9981 Dependence on supplemental oxygen: Secondary | ICD-10-CM | POA: Diagnosis not present

## 2016-08-10 DIAGNOSIS — I130 Hypertensive heart and chronic kidney disease with heart failure and stage 1 through stage 4 chronic kidney disease, or unspecified chronic kidney disease: Secondary | ICD-10-CM | POA: Diagnosis not present

## 2016-08-10 DIAGNOSIS — C3490 Malignant neoplasm of unspecified part of unspecified bronchus or lung: Secondary | ICD-10-CM | POA: Diagnosis not present

## 2016-08-10 DIAGNOSIS — Z794 Long term (current) use of insulin: Secondary | ICD-10-CM | POA: Diagnosis not present

## 2016-08-10 DIAGNOSIS — E1140 Type 2 diabetes mellitus with diabetic neuropathy, unspecified: Secondary | ICD-10-CM | POA: Diagnosis not present

## 2016-08-10 DIAGNOSIS — Z7901 Long term (current) use of anticoagulants: Secondary | ICD-10-CM | POA: Diagnosis not present

## 2016-08-10 DIAGNOSIS — K210 Gastro-esophageal reflux disease with esophagitis: Secondary | ICD-10-CM | POA: Diagnosis not present

## 2016-08-10 DIAGNOSIS — N189 Chronic kidney disease, unspecified: Secondary | ICD-10-CM | POA: Diagnosis not present

## 2016-08-10 DIAGNOSIS — D519 Vitamin B12 deficiency anemia, unspecified: Secondary | ICD-10-CM | POA: Diagnosis not present

## 2016-08-10 DIAGNOSIS — I872 Venous insufficiency (chronic) (peripheral): Secondary | ICD-10-CM | POA: Diagnosis not present

## 2016-08-10 DIAGNOSIS — I5032 Chronic diastolic (congestive) heart failure: Secondary | ICD-10-CM | POA: Diagnosis not present

## 2016-08-10 DIAGNOSIS — Z87891 Personal history of nicotine dependence: Secondary | ICD-10-CM | POA: Diagnosis not present

## 2016-08-10 DIAGNOSIS — E1122 Type 2 diabetes mellitus with diabetic chronic kidney disease: Secondary | ICD-10-CM | POA: Diagnosis not present

## 2016-08-10 DIAGNOSIS — L97921 Non-pressure chronic ulcer of unspecified part of left lower leg limited to breakdown of skin: Secondary | ICD-10-CM | POA: Diagnosis not present

## 2016-08-15 DIAGNOSIS — Z794 Long term (current) use of insulin: Secondary | ICD-10-CM | POA: Diagnosis not present

## 2016-08-15 DIAGNOSIS — I872 Venous insufficiency (chronic) (peripheral): Secondary | ICD-10-CM | POA: Diagnosis not present

## 2016-08-15 DIAGNOSIS — I5032 Chronic diastolic (congestive) heart failure: Secondary | ICD-10-CM | POA: Diagnosis not present

## 2016-08-15 DIAGNOSIS — E1122 Type 2 diabetes mellitus with diabetic chronic kidney disease: Secondary | ICD-10-CM | POA: Diagnosis not present

## 2016-08-15 DIAGNOSIS — I482 Chronic atrial fibrillation: Secondary | ICD-10-CM | POA: Diagnosis not present

## 2016-08-15 DIAGNOSIS — C3490 Malignant neoplasm of unspecified part of unspecified bronchus or lung: Secondary | ICD-10-CM | POA: Diagnosis not present

## 2016-08-15 DIAGNOSIS — I130 Hypertensive heart and chronic kidney disease with heart failure and stage 1 through stage 4 chronic kidney disease, or unspecified chronic kidney disease: Secondary | ICD-10-CM | POA: Diagnosis not present

## 2016-08-15 DIAGNOSIS — L97921 Non-pressure chronic ulcer of unspecified part of left lower leg limited to breakdown of skin: Secondary | ICD-10-CM | POA: Diagnosis not present

## 2016-08-15 DIAGNOSIS — Z87891 Personal history of nicotine dependence: Secondary | ICD-10-CM | POA: Diagnosis not present

## 2016-08-15 DIAGNOSIS — K210 Gastro-esophageal reflux disease with esophagitis: Secondary | ICD-10-CM | POA: Diagnosis not present

## 2016-08-15 DIAGNOSIS — N189 Chronic kidney disease, unspecified: Secondary | ICD-10-CM | POA: Diagnosis not present

## 2016-08-15 DIAGNOSIS — E1140 Type 2 diabetes mellitus with diabetic neuropathy, unspecified: Secondary | ICD-10-CM | POA: Diagnosis not present

## 2016-08-15 DIAGNOSIS — Z7901 Long term (current) use of anticoagulants: Secondary | ICD-10-CM | POA: Diagnosis not present

## 2016-08-15 DIAGNOSIS — D519 Vitamin B12 deficiency anemia, unspecified: Secondary | ICD-10-CM | POA: Diagnosis not present

## 2016-08-15 DIAGNOSIS — J449 Chronic obstructive pulmonary disease, unspecified: Secondary | ICD-10-CM | POA: Diagnosis not present

## 2016-08-15 DIAGNOSIS — Z9981 Dependence on supplemental oxygen: Secondary | ICD-10-CM | POA: Diagnosis not present

## 2016-08-15 DIAGNOSIS — Z602 Problems related to living alone: Secondary | ICD-10-CM | POA: Diagnosis not present

## 2016-08-17 DIAGNOSIS — Z602 Problems related to living alone: Secondary | ICD-10-CM | POA: Diagnosis not present

## 2016-08-17 DIAGNOSIS — I5032 Chronic diastolic (congestive) heart failure: Secondary | ICD-10-CM | POA: Diagnosis not present

## 2016-08-17 DIAGNOSIS — Z87891 Personal history of nicotine dependence: Secondary | ICD-10-CM | POA: Diagnosis not present

## 2016-08-17 DIAGNOSIS — E1140 Type 2 diabetes mellitus with diabetic neuropathy, unspecified: Secondary | ICD-10-CM | POA: Diagnosis not present

## 2016-08-17 DIAGNOSIS — E1122 Type 2 diabetes mellitus with diabetic chronic kidney disease: Secondary | ICD-10-CM | POA: Diagnosis not present

## 2016-08-17 DIAGNOSIS — I130 Hypertensive heart and chronic kidney disease with heart failure and stage 1 through stage 4 chronic kidney disease, or unspecified chronic kidney disease: Secondary | ICD-10-CM | POA: Diagnosis not present

## 2016-08-17 DIAGNOSIS — I872 Venous insufficiency (chronic) (peripheral): Secondary | ICD-10-CM | POA: Diagnosis not present

## 2016-08-17 DIAGNOSIS — K210 Gastro-esophageal reflux disease with esophagitis: Secondary | ICD-10-CM | POA: Diagnosis not present

## 2016-08-17 DIAGNOSIS — Z9981 Dependence on supplemental oxygen: Secondary | ICD-10-CM | POA: Diagnosis not present

## 2016-08-17 DIAGNOSIS — I482 Chronic atrial fibrillation: Secondary | ICD-10-CM | POA: Diagnosis not present

## 2016-08-17 DIAGNOSIS — C3490 Malignant neoplasm of unspecified part of unspecified bronchus or lung: Secondary | ICD-10-CM | POA: Diagnosis not present

## 2016-08-17 DIAGNOSIS — L97921 Non-pressure chronic ulcer of unspecified part of left lower leg limited to breakdown of skin: Secondary | ICD-10-CM | POA: Diagnosis not present

## 2016-08-17 DIAGNOSIS — J449 Chronic obstructive pulmonary disease, unspecified: Secondary | ICD-10-CM | POA: Diagnosis not present

## 2016-08-17 DIAGNOSIS — N189 Chronic kidney disease, unspecified: Secondary | ICD-10-CM | POA: Diagnosis not present

## 2016-08-17 DIAGNOSIS — D519 Vitamin B12 deficiency anemia, unspecified: Secondary | ICD-10-CM | POA: Diagnosis not present

## 2016-08-17 DIAGNOSIS — Z7901 Long term (current) use of anticoagulants: Secondary | ICD-10-CM | POA: Diagnosis not present

## 2016-08-17 DIAGNOSIS — Z794 Long term (current) use of insulin: Secondary | ICD-10-CM | POA: Diagnosis not present

## 2016-08-21 DIAGNOSIS — K210 Gastro-esophageal reflux disease with esophagitis: Secondary | ICD-10-CM | POA: Diagnosis not present

## 2016-08-21 DIAGNOSIS — Z9981 Dependence on supplemental oxygen: Secondary | ICD-10-CM | POA: Diagnosis not present

## 2016-08-21 DIAGNOSIS — N189 Chronic kidney disease, unspecified: Secondary | ICD-10-CM | POA: Diagnosis not present

## 2016-08-21 DIAGNOSIS — I130 Hypertensive heart and chronic kidney disease with heart failure and stage 1 through stage 4 chronic kidney disease, or unspecified chronic kidney disease: Secondary | ICD-10-CM | POA: Diagnosis not present

## 2016-08-21 DIAGNOSIS — E1122 Type 2 diabetes mellitus with diabetic chronic kidney disease: Secondary | ICD-10-CM | POA: Diagnosis not present

## 2016-08-21 DIAGNOSIS — Z794 Long term (current) use of insulin: Secondary | ICD-10-CM | POA: Diagnosis not present

## 2016-08-21 DIAGNOSIS — D519 Vitamin B12 deficiency anemia, unspecified: Secondary | ICD-10-CM | POA: Diagnosis not present

## 2016-08-21 DIAGNOSIS — L97921 Non-pressure chronic ulcer of unspecified part of left lower leg limited to breakdown of skin: Secondary | ICD-10-CM | POA: Diagnosis not present

## 2016-08-21 DIAGNOSIS — E785 Hyperlipidemia, unspecified: Secondary | ICD-10-CM | POA: Diagnosis not present

## 2016-08-21 DIAGNOSIS — C3490 Malignant neoplasm of unspecified part of unspecified bronchus or lung: Secondary | ICD-10-CM | POA: Diagnosis not present

## 2016-08-21 DIAGNOSIS — M109 Gout, unspecified: Secondary | ICD-10-CM | POA: Diagnosis not present

## 2016-08-21 DIAGNOSIS — Z87891 Personal history of nicotine dependence: Secondary | ICD-10-CM | POA: Diagnosis not present

## 2016-08-21 DIAGNOSIS — E119 Type 2 diabetes mellitus without complications: Secondary | ICD-10-CM | POA: Diagnosis not present

## 2016-08-21 DIAGNOSIS — R413 Other amnesia: Secondary | ICD-10-CM | POA: Diagnosis not present

## 2016-08-21 DIAGNOSIS — J449 Chronic obstructive pulmonary disease, unspecified: Secondary | ICD-10-CM | POA: Diagnosis not present

## 2016-08-21 DIAGNOSIS — Z602 Problems related to living alone: Secondary | ICD-10-CM | POA: Diagnosis not present

## 2016-08-21 DIAGNOSIS — I872 Venous insufficiency (chronic) (peripheral): Secondary | ICD-10-CM | POA: Diagnosis not present

## 2016-08-21 DIAGNOSIS — D649 Anemia, unspecified: Secondary | ICD-10-CM | POA: Diagnosis not present

## 2016-08-21 DIAGNOSIS — Z7901 Long term (current) use of anticoagulants: Secondary | ICD-10-CM | POA: Diagnosis not present

## 2016-08-21 DIAGNOSIS — I5032 Chronic diastolic (congestive) heart failure: Secondary | ICD-10-CM | POA: Diagnosis not present

## 2016-08-21 DIAGNOSIS — E1140 Type 2 diabetes mellitus with diabetic neuropathy, unspecified: Secondary | ICD-10-CM | POA: Diagnosis not present

## 2016-08-21 DIAGNOSIS — I4891 Unspecified atrial fibrillation: Secondary | ICD-10-CM | POA: Diagnosis not present

## 2016-08-21 DIAGNOSIS — I482 Chronic atrial fibrillation: Secondary | ICD-10-CM | POA: Diagnosis not present

## 2016-08-21 DIAGNOSIS — N183 Chronic kidney disease, stage 3 (moderate): Secondary | ICD-10-CM | POA: Diagnosis not present

## 2016-08-24 DIAGNOSIS — E1122 Type 2 diabetes mellitus with diabetic chronic kidney disease: Secondary | ICD-10-CM | POA: Diagnosis not present

## 2016-08-24 DIAGNOSIS — Z7901 Long term (current) use of anticoagulants: Secondary | ICD-10-CM | POA: Diagnosis not present

## 2016-08-24 DIAGNOSIS — E1140 Type 2 diabetes mellitus with diabetic neuropathy, unspecified: Secondary | ICD-10-CM | POA: Diagnosis not present

## 2016-08-24 DIAGNOSIS — L97921 Non-pressure chronic ulcer of unspecified part of left lower leg limited to breakdown of skin: Secondary | ICD-10-CM | POA: Diagnosis not present

## 2016-08-24 DIAGNOSIS — C3490 Malignant neoplasm of unspecified part of unspecified bronchus or lung: Secondary | ICD-10-CM | POA: Diagnosis not present

## 2016-08-24 DIAGNOSIS — Z9981 Dependence on supplemental oxygen: Secondary | ICD-10-CM | POA: Diagnosis not present

## 2016-08-24 DIAGNOSIS — Z602 Problems related to living alone: Secondary | ICD-10-CM | POA: Diagnosis not present

## 2016-08-24 DIAGNOSIS — D519 Vitamin B12 deficiency anemia, unspecified: Secondary | ICD-10-CM | POA: Diagnosis not present

## 2016-08-24 DIAGNOSIS — J449 Chronic obstructive pulmonary disease, unspecified: Secondary | ICD-10-CM | POA: Diagnosis not present

## 2016-08-24 DIAGNOSIS — Z87891 Personal history of nicotine dependence: Secondary | ICD-10-CM | POA: Diagnosis not present

## 2016-08-24 DIAGNOSIS — I872 Venous insufficiency (chronic) (peripheral): Secondary | ICD-10-CM | POA: Diagnosis not present

## 2016-08-24 DIAGNOSIS — K210 Gastro-esophageal reflux disease with esophagitis: Secondary | ICD-10-CM | POA: Diagnosis not present

## 2016-08-24 DIAGNOSIS — I5032 Chronic diastolic (congestive) heart failure: Secondary | ICD-10-CM | POA: Diagnosis not present

## 2016-08-24 DIAGNOSIS — N189 Chronic kidney disease, unspecified: Secondary | ICD-10-CM | POA: Diagnosis not present

## 2016-08-24 DIAGNOSIS — I482 Chronic atrial fibrillation: Secondary | ICD-10-CM | POA: Diagnosis not present

## 2016-08-24 DIAGNOSIS — I130 Hypertensive heart and chronic kidney disease with heart failure and stage 1 through stage 4 chronic kidney disease, or unspecified chronic kidney disease: Secondary | ICD-10-CM | POA: Diagnosis not present

## 2016-08-24 DIAGNOSIS — Z794 Long term (current) use of insulin: Secondary | ICD-10-CM | POA: Diagnosis not present

## 2016-08-27 DIAGNOSIS — L97921 Non-pressure chronic ulcer of unspecified part of left lower leg limited to breakdown of skin: Secondary | ICD-10-CM | POA: Diagnosis not present

## 2016-08-27 DIAGNOSIS — E1122 Type 2 diabetes mellitus with diabetic chronic kidney disease: Secondary | ICD-10-CM | POA: Diagnosis not present

## 2016-08-27 DIAGNOSIS — Z87891 Personal history of nicotine dependence: Secondary | ICD-10-CM | POA: Diagnosis not present

## 2016-08-27 DIAGNOSIS — I5032 Chronic diastolic (congestive) heart failure: Secondary | ICD-10-CM | POA: Diagnosis not present

## 2016-08-27 DIAGNOSIS — K210 Gastro-esophageal reflux disease with esophagitis: Secondary | ICD-10-CM | POA: Diagnosis not present

## 2016-08-27 DIAGNOSIS — Z9981 Dependence on supplemental oxygen: Secondary | ICD-10-CM | POA: Diagnosis not present

## 2016-08-27 DIAGNOSIS — D519 Vitamin B12 deficiency anemia, unspecified: Secondary | ICD-10-CM | POA: Diagnosis not present

## 2016-08-27 DIAGNOSIS — I130 Hypertensive heart and chronic kidney disease with heart failure and stage 1 through stage 4 chronic kidney disease, or unspecified chronic kidney disease: Secondary | ICD-10-CM | POA: Diagnosis not present

## 2016-08-27 DIAGNOSIS — I482 Chronic atrial fibrillation: Secondary | ICD-10-CM | POA: Diagnosis not present

## 2016-08-27 DIAGNOSIS — Z794 Long term (current) use of insulin: Secondary | ICD-10-CM | POA: Diagnosis not present

## 2016-08-27 DIAGNOSIS — Z7901 Long term (current) use of anticoagulants: Secondary | ICD-10-CM | POA: Diagnosis not present

## 2016-08-27 DIAGNOSIS — C3490 Malignant neoplasm of unspecified part of unspecified bronchus or lung: Secondary | ICD-10-CM | POA: Diagnosis not present

## 2016-08-27 DIAGNOSIS — E1140 Type 2 diabetes mellitus with diabetic neuropathy, unspecified: Secondary | ICD-10-CM | POA: Diagnosis not present

## 2016-08-27 DIAGNOSIS — J449 Chronic obstructive pulmonary disease, unspecified: Secondary | ICD-10-CM | POA: Diagnosis not present

## 2016-08-27 DIAGNOSIS — N189 Chronic kidney disease, unspecified: Secondary | ICD-10-CM | POA: Diagnosis not present

## 2016-08-27 DIAGNOSIS — Z602 Problems related to living alone: Secondary | ICD-10-CM | POA: Diagnosis not present

## 2016-08-27 DIAGNOSIS — I872 Venous insufficiency (chronic) (peripheral): Secondary | ICD-10-CM | POA: Diagnosis not present

## 2016-08-29 DIAGNOSIS — Z7901 Long term (current) use of anticoagulants: Secondary | ICD-10-CM | POA: Diagnosis not present

## 2016-08-29 DIAGNOSIS — Z794 Long term (current) use of insulin: Secondary | ICD-10-CM | POA: Diagnosis not present

## 2016-08-29 DIAGNOSIS — N189 Chronic kidney disease, unspecified: Secondary | ICD-10-CM | POA: Diagnosis not present

## 2016-08-29 DIAGNOSIS — Z87891 Personal history of nicotine dependence: Secondary | ICD-10-CM | POA: Diagnosis not present

## 2016-08-29 DIAGNOSIS — J449 Chronic obstructive pulmonary disease, unspecified: Secondary | ICD-10-CM | POA: Diagnosis not present

## 2016-08-29 DIAGNOSIS — I482 Chronic atrial fibrillation: Secondary | ICD-10-CM | POA: Diagnosis not present

## 2016-08-29 DIAGNOSIS — C3490 Malignant neoplasm of unspecified part of unspecified bronchus or lung: Secondary | ICD-10-CM | POA: Diagnosis not present

## 2016-08-29 DIAGNOSIS — I5032 Chronic diastolic (congestive) heart failure: Secondary | ICD-10-CM | POA: Diagnosis not present

## 2016-08-29 DIAGNOSIS — K210 Gastro-esophageal reflux disease with esophagitis: Secondary | ICD-10-CM | POA: Diagnosis not present

## 2016-08-29 DIAGNOSIS — Z602 Problems related to living alone: Secondary | ICD-10-CM | POA: Diagnosis not present

## 2016-08-29 DIAGNOSIS — I130 Hypertensive heart and chronic kidney disease with heart failure and stage 1 through stage 4 chronic kidney disease, or unspecified chronic kidney disease: Secondary | ICD-10-CM | POA: Diagnosis not present

## 2016-08-29 DIAGNOSIS — I872 Venous insufficiency (chronic) (peripheral): Secondary | ICD-10-CM | POA: Diagnosis not present

## 2016-08-29 DIAGNOSIS — E1140 Type 2 diabetes mellitus with diabetic neuropathy, unspecified: Secondary | ICD-10-CM | POA: Diagnosis not present

## 2016-08-29 DIAGNOSIS — E1122 Type 2 diabetes mellitus with diabetic chronic kidney disease: Secondary | ICD-10-CM | POA: Diagnosis not present

## 2016-08-29 DIAGNOSIS — Z9981 Dependence on supplemental oxygen: Secondary | ICD-10-CM | POA: Diagnosis not present

## 2016-08-29 DIAGNOSIS — L97921 Non-pressure chronic ulcer of unspecified part of left lower leg limited to breakdown of skin: Secondary | ICD-10-CM | POA: Diagnosis not present

## 2016-08-29 DIAGNOSIS — D519 Vitamin B12 deficiency anemia, unspecified: Secondary | ICD-10-CM | POA: Diagnosis not present

## 2016-08-31 DIAGNOSIS — N189 Chronic kidney disease, unspecified: Secondary | ICD-10-CM | POA: Diagnosis not present

## 2016-08-31 DIAGNOSIS — D519 Vitamin B12 deficiency anemia, unspecified: Secondary | ICD-10-CM | POA: Diagnosis not present

## 2016-08-31 DIAGNOSIS — K210 Gastro-esophageal reflux disease with esophagitis: Secondary | ICD-10-CM | POA: Diagnosis not present

## 2016-08-31 DIAGNOSIS — Z9981 Dependence on supplemental oxygen: Secondary | ICD-10-CM | POA: Diagnosis not present

## 2016-08-31 DIAGNOSIS — I872 Venous insufficiency (chronic) (peripheral): Secondary | ICD-10-CM | POA: Diagnosis not present

## 2016-08-31 DIAGNOSIS — Z7901 Long term (current) use of anticoagulants: Secondary | ICD-10-CM | POA: Diagnosis not present

## 2016-08-31 DIAGNOSIS — L97921 Non-pressure chronic ulcer of unspecified part of left lower leg limited to breakdown of skin: Secondary | ICD-10-CM | POA: Diagnosis not present

## 2016-08-31 DIAGNOSIS — E1140 Type 2 diabetes mellitus with diabetic neuropathy, unspecified: Secondary | ICD-10-CM | POA: Diagnosis not present

## 2016-08-31 DIAGNOSIS — E1122 Type 2 diabetes mellitus with diabetic chronic kidney disease: Secondary | ICD-10-CM | POA: Diagnosis not present

## 2016-08-31 DIAGNOSIS — I130 Hypertensive heart and chronic kidney disease with heart failure and stage 1 through stage 4 chronic kidney disease, or unspecified chronic kidney disease: Secondary | ICD-10-CM | POA: Diagnosis not present

## 2016-08-31 DIAGNOSIS — C3490 Malignant neoplasm of unspecified part of unspecified bronchus or lung: Secondary | ICD-10-CM | POA: Diagnosis not present

## 2016-08-31 DIAGNOSIS — I482 Chronic atrial fibrillation: Secondary | ICD-10-CM | POA: Diagnosis not present

## 2016-08-31 DIAGNOSIS — Z87891 Personal history of nicotine dependence: Secondary | ICD-10-CM | POA: Diagnosis not present

## 2016-08-31 DIAGNOSIS — J449 Chronic obstructive pulmonary disease, unspecified: Secondary | ICD-10-CM | POA: Diagnosis not present

## 2016-08-31 DIAGNOSIS — Z602 Problems related to living alone: Secondary | ICD-10-CM | POA: Diagnosis not present

## 2016-08-31 DIAGNOSIS — I5032 Chronic diastolic (congestive) heart failure: Secondary | ICD-10-CM | POA: Diagnosis not present

## 2016-08-31 DIAGNOSIS — Z794 Long term (current) use of insulin: Secondary | ICD-10-CM | POA: Diagnosis not present

## 2016-09-05 DIAGNOSIS — I5032 Chronic diastolic (congestive) heart failure: Secondary | ICD-10-CM | POA: Diagnosis not present

## 2016-09-05 DIAGNOSIS — Z602 Problems related to living alone: Secondary | ICD-10-CM | POA: Diagnosis not present

## 2016-09-05 DIAGNOSIS — E1122 Type 2 diabetes mellitus with diabetic chronic kidney disease: Secondary | ICD-10-CM | POA: Diagnosis not present

## 2016-09-05 DIAGNOSIS — Z9981 Dependence on supplemental oxygen: Secondary | ICD-10-CM | POA: Diagnosis not present

## 2016-09-05 DIAGNOSIS — Z87891 Personal history of nicotine dependence: Secondary | ICD-10-CM | POA: Diagnosis not present

## 2016-09-05 DIAGNOSIS — J449 Chronic obstructive pulmonary disease, unspecified: Secondary | ICD-10-CM | POA: Diagnosis not present

## 2016-09-05 DIAGNOSIS — I130 Hypertensive heart and chronic kidney disease with heart failure and stage 1 through stage 4 chronic kidney disease, or unspecified chronic kidney disease: Secondary | ICD-10-CM | POA: Diagnosis not present

## 2016-09-05 DIAGNOSIS — C3490 Malignant neoplasm of unspecified part of unspecified bronchus or lung: Secondary | ICD-10-CM | POA: Diagnosis not present

## 2016-09-05 DIAGNOSIS — Z7901 Long term (current) use of anticoagulants: Secondary | ICD-10-CM | POA: Diagnosis not present

## 2016-09-05 DIAGNOSIS — N189 Chronic kidney disease, unspecified: Secondary | ICD-10-CM | POA: Diagnosis not present

## 2016-09-05 DIAGNOSIS — I482 Chronic atrial fibrillation: Secondary | ICD-10-CM | POA: Diagnosis not present

## 2016-09-05 DIAGNOSIS — E1140 Type 2 diabetes mellitus with diabetic neuropathy, unspecified: Secondary | ICD-10-CM | POA: Diagnosis not present

## 2016-09-05 DIAGNOSIS — Z794 Long term (current) use of insulin: Secondary | ICD-10-CM | POA: Diagnosis not present

## 2016-09-05 DIAGNOSIS — D519 Vitamin B12 deficiency anemia, unspecified: Secondary | ICD-10-CM | POA: Diagnosis not present

## 2016-09-05 DIAGNOSIS — I872 Venous insufficiency (chronic) (peripheral): Secondary | ICD-10-CM | POA: Diagnosis not present

## 2016-09-05 DIAGNOSIS — K210 Gastro-esophageal reflux disease with esophagitis: Secondary | ICD-10-CM | POA: Diagnosis not present

## 2016-09-05 DIAGNOSIS — L97921 Non-pressure chronic ulcer of unspecified part of left lower leg limited to breakdown of skin: Secondary | ICD-10-CM | POA: Diagnosis not present

## 2016-09-07 DIAGNOSIS — Z794 Long term (current) use of insulin: Secondary | ICD-10-CM | POA: Diagnosis not present

## 2016-09-07 DIAGNOSIS — K210 Gastro-esophageal reflux disease with esophagitis: Secondary | ICD-10-CM | POA: Diagnosis not present

## 2016-09-07 DIAGNOSIS — I130 Hypertensive heart and chronic kidney disease with heart failure and stage 1 through stage 4 chronic kidney disease, or unspecified chronic kidney disease: Secondary | ICD-10-CM | POA: Diagnosis not present

## 2016-09-07 DIAGNOSIS — I482 Chronic atrial fibrillation: Secondary | ICD-10-CM | POA: Diagnosis not present

## 2016-09-07 DIAGNOSIS — Z7901 Long term (current) use of anticoagulants: Secondary | ICD-10-CM | POA: Diagnosis not present

## 2016-09-07 DIAGNOSIS — E1140 Type 2 diabetes mellitus with diabetic neuropathy, unspecified: Secondary | ICD-10-CM | POA: Diagnosis not present

## 2016-09-07 DIAGNOSIS — I5032 Chronic diastolic (congestive) heart failure: Secondary | ICD-10-CM | POA: Diagnosis not present

## 2016-09-07 DIAGNOSIS — I872 Venous insufficiency (chronic) (peripheral): Secondary | ICD-10-CM | POA: Diagnosis not present

## 2016-09-07 DIAGNOSIS — N189 Chronic kidney disease, unspecified: Secondary | ICD-10-CM | POA: Diagnosis not present

## 2016-09-07 DIAGNOSIS — E1122 Type 2 diabetes mellitus with diabetic chronic kidney disease: Secondary | ICD-10-CM | POA: Diagnosis not present

## 2016-09-07 DIAGNOSIS — Z9981 Dependence on supplemental oxygen: Secondary | ICD-10-CM | POA: Diagnosis not present

## 2016-09-07 DIAGNOSIS — L97921 Non-pressure chronic ulcer of unspecified part of left lower leg limited to breakdown of skin: Secondary | ICD-10-CM | POA: Diagnosis not present

## 2016-09-07 DIAGNOSIS — D519 Vitamin B12 deficiency anemia, unspecified: Secondary | ICD-10-CM | POA: Diagnosis not present

## 2016-09-07 DIAGNOSIS — J449 Chronic obstructive pulmonary disease, unspecified: Secondary | ICD-10-CM | POA: Diagnosis not present

## 2016-09-07 DIAGNOSIS — Z602 Problems related to living alone: Secondary | ICD-10-CM | POA: Diagnosis not present

## 2016-09-07 DIAGNOSIS — Z87891 Personal history of nicotine dependence: Secondary | ICD-10-CM | POA: Diagnosis not present

## 2016-09-07 DIAGNOSIS — C3490 Malignant neoplasm of unspecified part of unspecified bronchus or lung: Secondary | ICD-10-CM | POA: Diagnosis not present

## 2016-09-13 DIAGNOSIS — Z7901 Long term (current) use of anticoagulants: Secondary | ICD-10-CM | POA: Diagnosis not present

## 2016-09-13 DIAGNOSIS — Z602 Problems related to living alone: Secondary | ICD-10-CM | POA: Diagnosis not present

## 2016-09-13 DIAGNOSIS — I130 Hypertensive heart and chronic kidney disease with heart failure and stage 1 through stage 4 chronic kidney disease, or unspecified chronic kidney disease: Secondary | ICD-10-CM | POA: Diagnosis not present

## 2016-09-13 DIAGNOSIS — Z794 Long term (current) use of insulin: Secondary | ICD-10-CM | POA: Diagnosis not present

## 2016-09-13 DIAGNOSIS — Z87891 Personal history of nicotine dependence: Secondary | ICD-10-CM | POA: Diagnosis not present

## 2016-09-13 DIAGNOSIS — I482 Chronic atrial fibrillation: Secondary | ICD-10-CM | POA: Diagnosis not present

## 2016-09-13 DIAGNOSIS — I872 Venous insufficiency (chronic) (peripheral): Secondary | ICD-10-CM | POA: Diagnosis not present

## 2016-09-13 DIAGNOSIS — N189 Chronic kidney disease, unspecified: Secondary | ICD-10-CM | POA: Diagnosis not present

## 2016-09-13 DIAGNOSIS — C3490 Malignant neoplasm of unspecified part of unspecified bronchus or lung: Secondary | ICD-10-CM | POA: Diagnosis not present

## 2016-09-13 DIAGNOSIS — L97921 Non-pressure chronic ulcer of unspecified part of left lower leg limited to breakdown of skin: Secondary | ICD-10-CM | POA: Diagnosis not present

## 2016-09-13 DIAGNOSIS — Z9981 Dependence on supplemental oxygen: Secondary | ICD-10-CM | POA: Diagnosis not present

## 2016-09-13 DIAGNOSIS — E1122 Type 2 diabetes mellitus with diabetic chronic kidney disease: Secondary | ICD-10-CM | POA: Diagnosis not present

## 2016-09-13 DIAGNOSIS — I5032 Chronic diastolic (congestive) heart failure: Secondary | ICD-10-CM | POA: Diagnosis not present

## 2016-09-13 DIAGNOSIS — J449 Chronic obstructive pulmonary disease, unspecified: Secondary | ICD-10-CM | POA: Diagnosis not present

## 2016-09-13 DIAGNOSIS — D519 Vitamin B12 deficiency anemia, unspecified: Secondary | ICD-10-CM | POA: Diagnosis not present

## 2016-09-13 DIAGNOSIS — E1140 Type 2 diabetes mellitus with diabetic neuropathy, unspecified: Secondary | ICD-10-CM | POA: Diagnosis not present

## 2016-09-13 DIAGNOSIS — K210 Gastro-esophageal reflux disease with esophagitis: Secondary | ICD-10-CM | POA: Diagnosis not present

## 2016-09-14 DIAGNOSIS — Z794 Long term (current) use of insulin: Secondary | ICD-10-CM | POA: Diagnosis not present

## 2016-09-14 DIAGNOSIS — C3490 Malignant neoplasm of unspecified part of unspecified bronchus or lung: Secondary | ICD-10-CM | POA: Diagnosis not present

## 2016-09-14 DIAGNOSIS — I5032 Chronic diastolic (congestive) heart failure: Secondary | ICD-10-CM | POA: Diagnosis not present

## 2016-09-14 DIAGNOSIS — D519 Vitamin B12 deficiency anemia, unspecified: Secondary | ICD-10-CM | POA: Diagnosis not present

## 2016-09-14 DIAGNOSIS — Z7901 Long term (current) use of anticoagulants: Secondary | ICD-10-CM | POA: Diagnosis not present

## 2016-09-14 DIAGNOSIS — N189 Chronic kidney disease, unspecified: Secondary | ICD-10-CM | POA: Diagnosis not present

## 2016-09-14 DIAGNOSIS — J449 Chronic obstructive pulmonary disease, unspecified: Secondary | ICD-10-CM | POA: Diagnosis not present

## 2016-09-14 DIAGNOSIS — L97921 Non-pressure chronic ulcer of unspecified part of left lower leg limited to breakdown of skin: Secondary | ICD-10-CM | POA: Diagnosis not present

## 2016-09-14 DIAGNOSIS — E1122 Type 2 diabetes mellitus with diabetic chronic kidney disease: Secondary | ICD-10-CM | POA: Diagnosis not present

## 2016-09-14 DIAGNOSIS — K210 Gastro-esophageal reflux disease with esophagitis: Secondary | ICD-10-CM | POA: Diagnosis not present

## 2016-09-14 DIAGNOSIS — Z9981 Dependence on supplemental oxygen: Secondary | ICD-10-CM | POA: Diagnosis not present

## 2016-09-14 DIAGNOSIS — Z602 Problems related to living alone: Secondary | ICD-10-CM | POA: Diagnosis not present

## 2016-09-14 DIAGNOSIS — I130 Hypertensive heart and chronic kidney disease with heart failure and stage 1 through stage 4 chronic kidney disease, or unspecified chronic kidney disease: Secondary | ICD-10-CM | POA: Diagnosis not present

## 2016-09-14 DIAGNOSIS — I482 Chronic atrial fibrillation: Secondary | ICD-10-CM | POA: Diagnosis not present

## 2016-09-14 DIAGNOSIS — E1140 Type 2 diabetes mellitus with diabetic neuropathy, unspecified: Secondary | ICD-10-CM | POA: Diagnosis not present

## 2016-09-14 DIAGNOSIS — I872 Venous insufficiency (chronic) (peripheral): Secondary | ICD-10-CM | POA: Diagnosis not present

## 2016-09-14 DIAGNOSIS — Z87891 Personal history of nicotine dependence: Secondary | ICD-10-CM | POA: Diagnosis not present

## 2016-09-15 DIAGNOSIS — I130 Hypertensive heart and chronic kidney disease with heart failure and stage 1 through stage 4 chronic kidney disease, or unspecified chronic kidney disease: Secondary | ICD-10-CM | POA: Diagnosis not present

## 2016-09-15 DIAGNOSIS — Z7901 Long term (current) use of anticoagulants: Secondary | ICD-10-CM | POA: Diagnosis not present

## 2016-09-15 DIAGNOSIS — I872 Venous insufficiency (chronic) (peripheral): Secondary | ICD-10-CM | POA: Diagnosis not present

## 2016-09-15 DIAGNOSIS — C3490 Malignant neoplasm of unspecified part of unspecified bronchus or lung: Secondary | ICD-10-CM | POA: Diagnosis not present

## 2016-09-15 DIAGNOSIS — E1140 Type 2 diabetes mellitus with diabetic neuropathy, unspecified: Secondary | ICD-10-CM | POA: Diagnosis not present

## 2016-09-15 DIAGNOSIS — Z794 Long term (current) use of insulin: Secondary | ICD-10-CM | POA: Diagnosis not present

## 2016-09-15 DIAGNOSIS — N189 Chronic kidney disease, unspecified: Secondary | ICD-10-CM | POA: Diagnosis not present

## 2016-09-15 DIAGNOSIS — I5032 Chronic diastolic (congestive) heart failure: Secondary | ICD-10-CM | POA: Diagnosis not present

## 2016-09-15 DIAGNOSIS — D519 Vitamin B12 deficiency anemia, unspecified: Secondary | ICD-10-CM | POA: Diagnosis not present

## 2016-09-15 DIAGNOSIS — L97921 Non-pressure chronic ulcer of unspecified part of left lower leg limited to breakdown of skin: Secondary | ICD-10-CM | POA: Diagnosis not present

## 2016-09-15 DIAGNOSIS — K210 Gastro-esophageal reflux disease with esophagitis: Secondary | ICD-10-CM | POA: Diagnosis not present

## 2016-09-15 DIAGNOSIS — Z87891 Personal history of nicotine dependence: Secondary | ICD-10-CM | POA: Diagnosis not present

## 2016-09-15 DIAGNOSIS — J449 Chronic obstructive pulmonary disease, unspecified: Secondary | ICD-10-CM | POA: Diagnosis not present

## 2016-09-15 DIAGNOSIS — Z602 Problems related to living alone: Secondary | ICD-10-CM | POA: Diagnosis not present

## 2016-09-15 DIAGNOSIS — E1122 Type 2 diabetes mellitus with diabetic chronic kidney disease: Secondary | ICD-10-CM | POA: Diagnosis not present

## 2016-09-15 DIAGNOSIS — Z9981 Dependence on supplemental oxygen: Secondary | ICD-10-CM | POA: Diagnosis not present

## 2016-09-15 DIAGNOSIS — I482 Chronic atrial fibrillation: Secondary | ICD-10-CM | POA: Diagnosis not present

## 2016-09-19 DIAGNOSIS — I130 Hypertensive heart and chronic kidney disease with heart failure and stage 1 through stage 4 chronic kidney disease, or unspecified chronic kidney disease: Secondary | ICD-10-CM | POA: Diagnosis not present

## 2016-09-19 DIAGNOSIS — C3490 Malignant neoplasm of unspecified part of unspecified bronchus or lung: Secondary | ICD-10-CM | POA: Diagnosis not present

## 2016-09-19 DIAGNOSIS — K210 Gastro-esophageal reflux disease with esophagitis: Secondary | ICD-10-CM | POA: Diagnosis not present

## 2016-09-19 DIAGNOSIS — J449 Chronic obstructive pulmonary disease, unspecified: Secondary | ICD-10-CM | POA: Diagnosis not present

## 2016-09-19 DIAGNOSIS — D519 Vitamin B12 deficiency anemia, unspecified: Secondary | ICD-10-CM | POA: Diagnosis not present

## 2016-09-19 DIAGNOSIS — I5032 Chronic diastolic (congestive) heart failure: Secondary | ICD-10-CM | POA: Diagnosis not present

## 2016-09-19 DIAGNOSIS — N189 Chronic kidney disease, unspecified: Secondary | ICD-10-CM | POA: Diagnosis not present

## 2016-09-19 DIAGNOSIS — L97921 Non-pressure chronic ulcer of unspecified part of left lower leg limited to breakdown of skin: Secondary | ICD-10-CM | POA: Diagnosis not present

## 2016-09-19 DIAGNOSIS — Z87891 Personal history of nicotine dependence: Secondary | ICD-10-CM | POA: Diagnosis not present

## 2016-09-19 DIAGNOSIS — Z9981 Dependence on supplemental oxygen: Secondary | ICD-10-CM | POA: Diagnosis not present

## 2016-09-19 DIAGNOSIS — E1122 Type 2 diabetes mellitus with diabetic chronic kidney disease: Secondary | ICD-10-CM | POA: Diagnosis not present

## 2016-09-19 DIAGNOSIS — Z602 Problems related to living alone: Secondary | ICD-10-CM | POA: Diagnosis not present

## 2016-09-19 DIAGNOSIS — I872 Venous insufficiency (chronic) (peripheral): Secondary | ICD-10-CM | POA: Diagnosis not present

## 2016-09-19 DIAGNOSIS — Z7901 Long term (current) use of anticoagulants: Secondary | ICD-10-CM | POA: Diagnosis not present

## 2016-09-19 DIAGNOSIS — I482 Chronic atrial fibrillation: Secondary | ICD-10-CM | POA: Diagnosis not present

## 2016-09-19 DIAGNOSIS — E1140 Type 2 diabetes mellitus with diabetic neuropathy, unspecified: Secondary | ICD-10-CM | POA: Diagnosis not present

## 2016-09-19 DIAGNOSIS — Z794 Long term (current) use of insulin: Secondary | ICD-10-CM | POA: Diagnosis not present

## 2016-09-21 DIAGNOSIS — Z9981 Dependence on supplemental oxygen: Secondary | ICD-10-CM | POA: Diagnosis not present

## 2016-09-21 DIAGNOSIS — I10 Essential (primary) hypertension: Secondary | ICD-10-CM | POA: Diagnosis not present

## 2016-09-21 DIAGNOSIS — Z602 Problems related to living alone: Secondary | ICD-10-CM | POA: Diagnosis not present

## 2016-09-21 DIAGNOSIS — N189 Chronic kidney disease, unspecified: Secondary | ICD-10-CM | POA: Diagnosis not present

## 2016-09-21 DIAGNOSIS — I872 Venous insufficiency (chronic) (peripheral): Secondary | ICD-10-CM | POA: Diagnosis not present

## 2016-09-21 DIAGNOSIS — L97921 Non-pressure chronic ulcer of unspecified part of left lower leg limited to breakdown of skin: Secondary | ICD-10-CM | POA: Diagnosis not present

## 2016-09-21 DIAGNOSIS — E785 Hyperlipidemia, unspecified: Secondary | ICD-10-CM | POA: Diagnosis not present

## 2016-09-21 DIAGNOSIS — D519 Vitamin B12 deficiency anemia, unspecified: Secondary | ICD-10-CM | POA: Diagnosis not present

## 2016-09-21 DIAGNOSIS — I130 Hypertensive heart and chronic kidney disease with heart failure and stage 1 through stage 4 chronic kidney disease, or unspecified chronic kidney disease: Secondary | ICD-10-CM | POA: Diagnosis not present

## 2016-09-21 DIAGNOSIS — E1122 Type 2 diabetes mellitus with diabetic chronic kidney disease: Secondary | ICD-10-CM | POA: Diagnosis not present

## 2016-09-21 DIAGNOSIS — I482 Chronic atrial fibrillation: Secondary | ICD-10-CM | POA: Diagnosis not present

## 2016-09-21 DIAGNOSIS — J449 Chronic obstructive pulmonary disease, unspecified: Secondary | ICD-10-CM | POA: Diagnosis not present

## 2016-09-21 DIAGNOSIS — Z7901 Long term (current) use of anticoagulants: Secondary | ICD-10-CM | POA: Diagnosis not present

## 2016-09-21 DIAGNOSIS — K210 Gastro-esophageal reflux disease with esophagitis: Secondary | ICD-10-CM | POA: Diagnosis not present

## 2016-09-21 DIAGNOSIS — Z794 Long term (current) use of insulin: Secondary | ICD-10-CM | POA: Diagnosis not present

## 2016-09-21 DIAGNOSIS — Z87891 Personal history of nicotine dependence: Secondary | ICD-10-CM | POA: Diagnosis not present

## 2016-09-21 DIAGNOSIS — E1140 Type 2 diabetes mellitus with diabetic neuropathy, unspecified: Secondary | ICD-10-CM | POA: Diagnosis not present

## 2016-09-21 DIAGNOSIS — E119 Type 2 diabetes mellitus without complications: Secondary | ICD-10-CM | POA: Diagnosis not present

## 2016-09-21 DIAGNOSIS — C3490 Malignant neoplasm of unspecified part of unspecified bronchus or lung: Secondary | ICD-10-CM | POA: Diagnosis not present

## 2016-09-21 DIAGNOSIS — N183 Chronic kidney disease, stage 3 (moderate): Secondary | ICD-10-CM | POA: Diagnosis not present

## 2016-09-21 DIAGNOSIS — I5032 Chronic diastolic (congestive) heart failure: Secondary | ICD-10-CM | POA: Diagnosis not present

## 2016-09-26 DIAGNOSIS — I872 Venous insufficiency (chronic) (peripheral): Secondary | ICD-10-CM | POA: Diagnosis not present

## 2016-09-26 DIAGNOSIS — E1140 Type 2 diabetes mellitus with diabetic neuropathy, unspecified: Secondary | ICD-10-CM | POA: Diagnosis not present

## 2016-09-26 DIAGNOSIS — Z602 Problems related to living alone: Secondary | ICD-10-CM | POA: Diagnosis not present

## 2016-09-26 DIAGNOSIS — N189 Chronic kidney disease, unspecified: Secondary | ICD-10-CM | POA: Diagnosis not present

## 2016-09-26 DIAGNOSIS — K210 Gastro-esophageal reflux disease with esophagitis: Secondary | ICD-10-CM | POA: Diagnosis not present

## 2016-09-26 DIAGNOSIS — Z9981 Dependence on supplemental oxygen: Secondary | ICD-10-CM | POA: Diagnosis not present

## 2016-09-26 DIAGNOSIS — L97921 Non-pressure chronic ulcer of unspecified part of left lower leg limited to breakdown of skin: Secondary | ICD-10-CM | POA: Diagnosis not present

## 2016-09-26 DIAGNOSIS — C3490 Malignant neoplasm of unspecified part of unspecified bronchus or lung: Secondary | ICD-10-CM | POA: Diagnosis not present

## 2016-09-26 DIAGNOSIS — I130 Hypertensive heart and chronic kidney disease with heart failure and stage 1 through stage 4 chronic kidney disease, or unspecified chronic kidney disease: Secondary | ICD-10-CM | POA: Diagnosis not present

## 2016-09-26 DIAGNOSIS — J449 Chronic obstructive pulmonary disease, unspecified: Secondary | ICD-10-CM | POA: Diagnosis not present

## 2016-09-26 DIAGNOSIS — E1122 Type 2 diabetes mellitus with diabetic chronic kidney disease: Secondary | ICD-10-CM | POA: Diagnosis not present

## 2016-09-26 DIAGNOSIS — I5032 Chronic diastolic (congestive) heart failure: Secondary | ICD-10-CM | POA: Diagnosis not present

## 2016-09-26 DIAGNOSIS — I482 Chronic atrial fibrillation: Secondary | ICD-10-CM | POA: Diagnosis not present

## 2016-09-26 DIAGNOSIS — D519 Vitamin B12 deficiency anemia, unspecified: Secondary | ICD-10-CM | POA: Diagnosis not present

## 2016-09-26 DIAGNOSIS — Z7901 Long term (current) use of anticoagulants: Secondary | ICD-10-CM | POA: Diagnosis not present

## 2016-09-26 DIAGNOSIS — Z87891 Personal history of nicotine dependence: Secondary | ICD-10-CM | POA: Diagnosis not present

## 2016-09-26 DIAGNOSIS — Z794 Long term (current) use of insulin: Secondary | ICD-10-CM | POA: Diagnosis not present

## 2016-09-28 DIAGNOSIS — I5032 Chronic diastolic (congestive) heart failure: Secondary | ICD-10-CM | POA: Diagnosis not present

## 2016-09-28 DIAGNOSIS — Z602 Problems related to living alone: Secondary | ICD-10-CM | POA: Diagnosis not present

## 2016-09-28 DIAGNOSIS — J449 Chronic obstructive pulmonary disease, unspecified: Secondary | ICD-10-CM | POA: Diagnosis not present

## 2016-09-28 DIAGNOSIS — I130 Hypertensive heart and chronic kidney disease with heart failure and stage 1 through stage 4 chronic kidney disease, or unspecified chronic kidney disease: Secondary | ICD-10-CM | POA: Diagnosis not present

## 2016-09-28 DIAGNOSIS — E1122 Type 2 diabetes mellitus with diabetic chronic kidney disease: Secondary | ICD-10-CM | POA: Diagnosis not present

## 2016-09-28 DIAGNOSIS — L97921 Non-pressure chronic ulcer of unspecified part of left lower leg limited to breakdown of skin: Secondary | ICD-10-CM | POA: Diagnosis not present

## 2016-09-28 DIAGNOSIS — K210 Gastro-esophageal reflux disease with esophagitis: Secondary | ICD-10-CM | POA: Diagnosis not present

## 2016-09-28 DIAGNOSIS — N189 Chronic kidney disease, unspecified: Secondary | ICD-10-CM | POA: Diagnosis not present

## 2016-09-28 DIAGNOSIS — E1140 Type 2 diabetes mellitus with diabetic neuropathy, unspecified: Secondary | ICD-10-CM | POA: Diagnosis not present

## 2016-09-28 DIAGNOSIS — I872 Venous insufficiency (chronic) (peripheral): Secondary | ICD-10-CM | POA: Diagnosis not present

## 2016-09-28 DIAGNOSIS — Z7901 Long term (current) use of anticoagulants: Secondary | ICD-10-CM | POA: Diagnosis not present

## 2016-09-28 DIAGNOSIS — Z794 Long term (current) use of insulin: Secondary | ICD-10-CM | POA: Diagnosis not present

## 2016-09-28 DIAGNOSIS — C3490 Malignant neoplasm of unspecified part of unspecified bronchus or lung: Secondary | ICD-10-CM | POA: Diagnosis not present

## 2016-09-28 DIAGNOSIS — D519 Vitamin B12 deficiency anemia, unspecified: Secondary | ICD-10-CM | POA: Diagnosis not present

## 2016-09-28 DIAGNOSIS — Z9981 Dependence on supplemental oxygen: Secondary | ICD-10-CM | POA: Diagnosis not present

## 2016-09-28 DIAGNOSIS — I482 Chronic atrial fibrillation: Secondary | ICD-10-CM | POA: Diagnosis not present

## 2016-09-28 DIAGNOSIS — Z87891 Personal history of nicotine dependence: Secondary | ICD-10-CM | POA: Diagnosis not present

## 2016-09-29 DIAGNOSIS — J449 Chronic obstructive pulmonary disease, unspecified: Secondary | ICD-10-CM | POA: Diagnosis not present

## 2016-10-02 DIAGNOSIS — Z794 Long term (current) use of insulin: Secondary | ICD-10-CM | POA: Diagnosis not present

## 2016-10-02 DIAGNOSIS — E1122 Type 2 diabetes mellitus with diabetic chronic kidney disease: Secondary | ICD-10-CM | POA: Diagnosis not present

## 2016-10-02 DIAGNOSIS — I482 Chronic atrial fibrillation: Secondary | ICD-10-CM | POA: Diagnosis not present

## 2016-10-02 DIAGNOSIS — Z602 Problems related to living alone: Secondary | ICD-10-CM | POA: Diagnosis not present

## 2016-10-02 DIAGNOSIS — D519 Vitamin B12 deficiency anemia, unspecified: Secondary | ICD-10-CM | POA: Diagnosis not present

## 2016-10-02 DIAGNOSIS — Z87891 Personal history of nicotine dependence: Secondary | ICD-10-CM | POA: Diagnosis not present

## 2016-10-02 DIAGNOSIS — L97921 Non-pressure chronic ulcer of unspecified part of left lower leg limited to breakdown of skin: Secondary | ICD-10-CM | POA: Diagnosis not present

## 2016-10-02 DIAGNOSIS — I5032 Chronic diastolic (congestive) heart failure: Secondary | ICD-10-CM | POA: Diagnosis not present

## 2016-10-02 DIAGNOSIS — C3490 Malignant neoplasm of unspecified part of unspecified bronchus or lung: Secondary | ICD-10-CM | POA: Diagnosis not present

## 2016-10-02 DIAGNOSIS — I130 Hypertensive heart and chronic kidney disease with heart failure and stage 1 through stage 4 chronic kidney disease, or unspecified chronic kidney disease: Secondary | ICD-10-CM | POA: Diagnosis not present

## 2016-10-02 DIAGNOSIS — K210 Gastro-esophageal reflux disease with esophagitis: Secondary | ICD-10-CM | POA: Diagnosis not present

## 2016-10-02 DIAGNOSIS — Z7901 Long term (current) use of anticoagulants: Secondary | ICD-10-CM | POA: Diagnosis not present

## 2016-10-02 DIAGNOSIS — N189 Chronic kidney disease, unspecified: Secondary | ICD-10-CM | POA: Diagnosis not present

## 2016-10-02 DIAGNOSIS — E1140 Type 2 diabetes mellitus with diabetic neuropathy, unspecified: Secondary | ICD-10-CM | POA: Diagnosis not present

## 2016-10-02 DIAGNOSIS — I872 Venous insufficiency (chronic) (peripheral): Secondary | ICD-10-CM | POA: Diagnosis not present

## 2016-10-02 DIAGNOSIS — Z9981 Dependence on supplemental oxygen: Secondary | ICD-10-CM | POA: Diagnosis not present

## 2016-10-02 DIAGNOSIS — J449 Chronic obstructive pulmonary disease, unspecified: Secondary | ICD-10-CM | POA: Diagnosis not present

## 2016-10-05 DIAGNOSIS — E1122 Type 2 diabetes mellitus with diabetic chronic kidney disease: Secondary | ICD-10-CM | POA: Diagnosis not present

## 2016-10-05 DIAGNOSIS — D519 Vitamin B12 deficiency anemia, unspecified: Secondary | ICD-10-CM | POA: Diagnosis not present

## 2016-10-05 DIAGNOSIS — L97921 Non-pressure chronic ulcer of unspecified part of left lower leg limited to breakdown of skin: Secondary | ICD-10-CM | POA: Diagnosis not present

## 2016-10-05 DIAGNOSIS — I5032 Chronic diastolic (congestive) heart failure: Secondary | ICD-10-CM | POA: Diagnosis not present

## 2016-10-05 DIAGNOSIS — Z794 Long term (current) use of insulin: Secondary | ICD-10-CM | POA: Diagnosis not present

## 2016-10-05 DIAGNOSIS — I130 Hypertensive heart and chronic kidney disease with heart failure and stage 1 through stage 4 chronic kidney disease, or unspecified chronic kidney disease: Secondary | ICD-10-CM | POA: Diagnosis not present

## 2016-10-05 DIAGNOSIS — C3490 Malignant neoplasm of unspecified part of unspecified bronchus or lung: Secondary | ICD-10-CM | POA: Diagnosis not present

## 2016-10-05 DIAGNOSIS — I872 Venous insufficiency (chronic) (peripheral): Secondary | ICD-10-CM | POA: Diagnosis not present

## 2016-10-05 DIAGNOSIS — K210 Gastro-esophageal reflux disease with esophagitis: Secondary | ICD-10-CM | POA: Diagnosis not present

## 2016-10-05 DIAGNOSIS — Z9981 Dependence on supplemental oxygen: Secondary | ICD-10-CM | POA: Diagnosis not present

## 2016-10-05 DIAGNOSIS — Z7901 Long term (current) use of anticoagulants: Secondary | ICD-10-CM | POA: Diagnosis not present

## 2016-10-05 DIAGNOSIS — Z87891 Personal history of nicotine dependence: Secondary | ICD-10-CM | POA: Diagnosis not present

## 2016-10-05 DIAGNOSIS — J449 Chronic obstructive pulmonary disease, unspecified: Secondary | ICD-10-CM | POA: Diagnosis not present

## 2016-10-05 DIAGNOSIS — I482 Chronic atrial fibrillation: Secondary | ICD-10-CM | POA: Diagnosis not present

## 2016-10-05 DIAGNOSIS — Z602 Problems related to living alone: Secondary | ICD-10-CM | POA: Diagnosis not present

## 2016-10-05 DIAGNOSIS — E1140 Type 2 diabetes mellitus with diabetic neuropathy, unspecified: Secondary | ICD-10-CM | POA: Diagnosis not present

## 2016-10-05 DIAGNOSIS — N189 Chronic kidney disease, unspecified: Secondary | ICD-10-CM | POA: Diagnosis not present

## 2016-10-09 DIAGNOSIS — Z86711 Personal history of pulmonary embolism: Secondary | ICD-10-CM | POA: Diagnosis not present

## 2016-10-09 DIAGNOSIS — R2681 Unsteadiness on feet: Secondary | ICD-10-CM | POA: Diagnosis not present

## 2016-10-09 DIAGNOSIS — I700 Atherosclerosis of aorta: Secondary | ICD-10-CM | POA: Diagnosis not present

## 2016-10-09 DIAGNOSIS — R1310 Dysphagia, unspecified: Secondary | ICD-10-CM | POA: Diagnosis not present

## 2016-10-09 DIAGNOSIS — Z5309 Procedure and treatment not carried out because of other contraindication: Secondary | ICD-10-CM | POA: Diagnosis not present

## 2016-10-09 DIAGNOSIS — E119 Type 2 diabetes mellitus without complications: Secondary | ICD-10-CM | POA: Diagnosis not present

## 2016-10-09 DIAGNOSIS — R7989 Other specified abnormal findings of blood chemistry: Secondary | ICD-10-CM | POA: Diagnosis not present

## 2016-10-09 DIAGNOSIS — R262 Difficulty in walking, not elsewhere classified: Secondary | ICD-10-CM | POA: Diagnosis not present

## 2016-10-09 DIAGNOSIS — I6523 Occlusion and stenosis of bilateral carotid arteries: Secondary | ICD-10-CM | POA: Diagnosis not present

## 2016-10-09 DIAGNOSIS — M6281 Muscle weakness (generalized): Secondary | ICD-10-CM | POA: Diagnosis not present

## 2016-10-09 DIAGNOSIS — Z794 Long term (current) use of insulin: Secondary | ICD-10-CM | POA: Diagnosis not present

## 2016-10-09 DIAGNOSIS — I63233 Cerebral infarction due to unspecified occlusion or stenosis of bilateral carotid arteries: Secondary | ICD-10-CM | POA: Diagnosis not present

## 2016-10-09 DIAGNOSIS — N183 Chronic kidney disease, stage 3 (moderate): Secondary | ICD-10-CM | POA: Diagnosis not present

## 2016-10-09 DIAGNOSIS — J449 Chronic obstructive pulmonary disease, unspecified: Secondary | ICD-10-CM | POA: Diagnosis not present

## 2016-10-09 DIAGNOSIS — G464 Cerebellar stroke syndrome: Secondary | ICD-10-CM | POA: Diagnosis not present

## 2016-10-09 DIAGNOSIS — R29818 Other symptoms and signs involving the nervous system: Secondary | ICD-10-CM | POA: Diagnosis not present

## 2016-10-09 DIAGNOSIS — R4781 Slurred speech: Secondary | ICD-10-CM | POA: Diagnosis not present

## 2016-10-09 DIAGNOSIS — I4891 Unspecified atrial fibrillation: Secondary | ICD-10-CM | POA: Diagnosis not present

## 2016-10-09 DIAGNOSIS — R479 Unspecified speech disturbances: Secondary | ICD-10-CM | POA: Diagnosis not present

## 2016-10-09 DIAGNOSIS — Z7902 Long term (current) use of antithrombotics/antiplatelets: Secondary | ICD-10-CM | POA: Diagnosis not present

## 2016-10-09 DIAGNOSIS — E1165 Type 2 diabetes mellitus with hyperglycemia: Secondary | ICD-10-CM | POA: Diagnosis not present

## 2016-10-09 DIAGNOSIS — R29705 NIHSS score 5: Secondary | ICD-10-CM | POA: Diagnosis not present

## 2016-10-09 DIAGNOSIS — N179 Acute kidney failure, unspecified: Secondary | ICD-10-CM | POA: Diagnosis not present

## 2016-10-09 DIAGNOSIS — R279 Unspecified lack of coordination: Secondary | ICD-10-CM | POA: Diagnosis not present

## 2016-10-09 DIAGNOSIS — Z86718 Personal history of other venous thrombosis and embolism: Secondary | ICD-10-CM | POA: Diagnosis not present

## 2016-10-09 DIAGNOSIS — I639 Cerebral infarction, unspecified: Secondary | ICD-10-CM | POA: Diagnosis not present

## 2016-10-09 DIAGNOSIS — I671 Cerebral aneurysm, nonruptured: Secondary | ICD-10-CM | POA: Diagnosis not present

## 2016-10-09 DIAGNOSIS — J80 Acute respiratory distress syndrome: Secondary | ICD-10-CM | POA: Diagnosis not present

## 2016-10-09 DIAGNOSIS — I482 Chronic atrial fibrillation: Secondary | ICD-10-CM | POA: Diagnosis not present

## 2016-10-09 DIAGNOSIS — I6789 Other cerebrovascular disease: Secondary | ICD-10-CM | POA: Diagnosis not present

## 2016-10-09 DIAGNOSIS — I509 Heart failure, unspecified: Secondary | ICD-10-CM | POA: Diagnosis not present

## 2016-10-09 DIAGNOSIS — R29810 Facial weakness: Secondary | ICD-10-CM | POA: Diagnosis not present

## 2016-10-09 DIAGNOSIS — I130 Hypertensive heart and chronic kidney disease with heart failure and stage 1 through stage 4 chronic kidney disease, or unspecified chronic kidney disease: Secondary | ICD-10-CM | POA: Diagnosis not present

## 2016-10-09 DIAGNOSIS — Z79899 Other long term (current) drug therapy: Secondary | ICD-10-CM | POA: Diagnosis not present

## 2016-10-09 DIAGNOSIS — Z87891 Personal history of nicotine dependence: Secondary | ICD-10-CM | POA: Diagnosis not present

## 2016-10-09 DIAGNOSIS — E1122 Type 2 diabetes mellitus with diabetic chronic kidney disease: Secondary | ICD-10-CM | POA: Diagnosis not present

## 2016-10-09 DIAGNOSIS — I10 Essential (primary) hypertension: Secondary | ICD-10-CM | POA: Diagnosis not present

## 2016-10-10 ENCOUNTER — Other Ambulatory Visit (HOSPITAL_COMMUNITY): Payer: Self-pay | Admitting: Internal Medicine

## 2016-10-10 DIAGNOSIS — I771 Stricture of artery: Secondary | ICD-10-CM

## 2016-10-11 ENCOUNTER — Other Ambulatory Visit: Payer: Self-pay | Admitting: Radiology

## 2016-10-12 ENCOUNTER — Encounter (HOSPITAL_COMMUNITY): Payer: Self-pay

## 2016-10-12 ENCOUNTER — Ambulatory Visit (HOSPITAL_COMMUNITY)
Admission: RE | Admit: 2016-10-12 | Discharge: 2016-10-12 | Disposition: A | Discharge status: Home / Self Care | Payer: Medicare Other | Source: Ambulatory Visit | Attending: Internal Medicine | Admitting: Internal Medicine

## 2016-10-12 ENCOUNTER — Ambulatory Visit (HOSPITAL_COMMUNITY)
Admission: RE | Admit: 2016-10-12 | Discharge: 2016-10-12 | Disposition: A | Discharge status: Home / Self Care | Payer: Medicare Other | Attending: Interventional Radiology | Admitting: Interventional Radiology

## 2016-10-12 DIAGNOSIS — K219 Gastro-esophageal reflux disease without esophagitis: Secondary | ICD-10-CM | POA: Diagnosis not present

## 2016-10-12 DIAGNOSIS — Z85118 Personal history of other malignant neoplasm of bronchus and lung: Secondary | ICD-10-CM | POA: Diagnosis not present

## 2016-10-12 DIAGNOSIS — E162 Hypoglycemia, unspecified: Secondary | ICD-10-CM | POA: Diagnosis not present

## 2016-10-12 DIAGNOSIS — I639 Cerebral infarction, unspecified: Secondary | ICD-10-CM | POA: Diagnosis not present

## 2016-10-12 DIAGNOSIS — N179 Acute kidney failure, unspecified: Secondary | ICD-10-CM | POA: Diagnosis not present

## 2016-10-12 DIAGNOSIS — N183 Chronic kidney disease, stage 3 (moderate): Secondary | ICD-10-CM | POA: Diagnosis not present

## 2016-10-12 DIAGNOSIS — Z5309 Procedure and treatment not carried out because of other contraindication: Secondary | ICD-10-CM | POA: Diagnosis not present

## 2016-10-12 DIAGNOSIS — R7989 Other specified abnormal findings of blood chemistry: Secondary | ICD-10-CM | POA: Insufficient documentation

## 2016-10-12 DIAGNOSIS — I4891 Unspecified atrial fibrillation: Secondary | ICD-10-CM | POA: Diagnosis not present

## 2016-10-12 DIAGNOSIS — Z86718 Personal history of other venous thrombosis and embolism: Secondary | ICD-10-CM | POA: Diagnosis not present

## 2016-10-12 DIAGNOSIS — R030 Elevated blood-pressure reading, without diagnosis of hypertension: Secondary | ICD-10-CM | POA: Diagnosis not present

## 2016-10-12 DIAGNOSIS — R0602 Shortness of breath: Secondary | ICD-10-CM | POA: Diagnosis not present

## 2016-10-12 DIAGNOSIS — T68XXXA Hypothermia, initial encounter: Secondary | ICD-10-CM | POA: Diagnosis not present

## 2016-10-12 DIAGNOSIS — R680 Hypothermia, not associated with low environmental temperature: Secondary | ICD-10-CM | POA: Diagnosis not present

## 2016-10-12 DIAGNOSIS — I482 Chronic atrial fibrillation: Secondary | ICD-10-CM | POA: Diagnosis not present

## 2016-10-12 DIAGNOSIS — R1310 Dysphagia, unspecified: Secondary | ICD-10-CM | POA: Diagnosis not present

## 2016-10-12 DIAGNOSIS — I10 Essential (primary) hypertension: Secondary | ICD-10-CM | POA: Diagnosis not present

## 2016-10-12 DIAGNOSIS — I69992 Facial weakness following unspecified cerebrovascular disease: Secondary | ICD-10-CM | POA: Diagnosis not present

## 2016-10-12 DIAGNOSIS — E1165 Type 2 diabetes mellitus with hyperglycemia: Secondary | ICD-10-CM | POA: Diagnosis not present

## 2016-10-12 DIAGNOSIS — I69991 Dysphagia following unspecified cerebrovascular disease: Secondary | ICD-10-CM | POA: Diagnosis not present

## 2016-10-12 DIAGNOSIS — Z86711 Personal history of pulmonary embolism: Secondary | ICD-10-CM | POA: Diagnosis not present

## 2016-10-12 DIAGNOSIS — J449 Chronic obstructive pulmonary disease, unspecified: Secondary | ICD-10-CM | POA: Diagnosis not present

## 2016-10-12 DIAGNOSIS — Z794 Long term (current) use of insulin: Secondary | ICD-10-CM | POA: Diagnosis not present

## 2016-10-12 DIAGNOSIS — R279 Unspecified lack of coordination: Secondary | ICD-10-CM | POA: Diagnosis not present

## 2016-10-12 DIAGNOSIS — M6281 Muscle weakness (generalized): Secondary | ICD-10-CM | POA: Diagnosis not present

## 2016-10-12 DIAGNOSIS — I509 Heart failure, unspecified: Secondary | ICD-10-CM | POA: Diagnosis not present

## 2016-10-12 DIAGNOSIS — I130 Hypertensive heart and chronic kidney disease with heart failure and stage 1 through stage 4 chronic kidney disease, or unspecified chronic kidney disease: Secondary | ICD-10-CM | POA: Diagnosis not present

## 2016-10-12 DIAGNOSIS — Z79899 Other long term (current) drug therapy: Secondary | ICD-10-CM | POA: Diagnosis not present

## 2016-10-12 DIAGNOSIS — E119 Type 2 diabetes mellitus without complications: Secondary | ICD-10-CM | POA: Diagnosis not present

## 2016-10-12 DIAGNOSIS — E7800 Pure hypercholesterolemia, unspecified: Secondary | ICD-10-CM | POA: Diagnosis not present

## 2016-10-12 DIAGNOSIS — G464 Cerebellar stroke syndrome: Secondary | ICD-10-CM | POA: Diagnosis not present

## 2016-10-12 DIAGNOSIS — R2681 Unsteadiness on feet: Secondary | ICD-10-CM | POA: Diagnosis not present

## 2016-10-12 DIAGNOSIS — E10649 Type 1 diabetes mellitus with hypoglycemia without coma: Secondary | ICD-10-CM | POA: Diagnosis not present

## 2016-10-12 DIAGNOSIS — E1122 Type 2 diabetes mellitus with diabetic chronic kidney disease: Secondary | ICD-10-CM | POA: Diagnosis not present

## 2016-10-12 DIAGNOSIS — J80 Acute respiratory distress syndrome: Secondary | ICD-10-CM | POA: Diagnosis not present

## 2016-10-12 DIAGNOSIS — E1169 Type 2 diabetes mellitus with other specified complication: Secondary | ICD-10-CM | POA: Diagnosis not present

## 2016-10-12 DIAGNOSIS — E11649 Type 2 diabetes mellitus with hypoglycemia without coma: Secondary | ICD-10-CM | POA: Diagnosis not present

## 2016-10-12 DIAGNOSIS — I480 Paroxysmal atrial fibrillation: Secondary | ICD-10-CM | POA: Diagnosis not present

## 2016-10-12 DIAGNOSIS — R262 Difficulty in walking, not elsewhere classified: Secondary | ICD-10-CM | POA: Diagnosis not present

## 2016-10-12 DIAGNOSIS — I499 Cardiac arrhythmia, unspecified: Secondary | ICD-10-CM | POA: Diagnosis not present

## 2016-10-12 DIAGNOSIS — I671 Cerebral aneurysm, nonruptured: Secondary | ICD-10-CM | POA: Diagnosis not present

## 2016-10-12 DIAGNOSIS — I771 Stricture of artery: Secondary | ICD-10-CM

## 2016-10-12 DIAGNOSIS — R001 Bradycardia, unspecified: Secondary | ICD-10-CM | POA: Diagnosis not present

## 2016-10-12 DIAGNOSIS — Z87891 Personal history of nicotine dependence: Secondary | ICD-10-CM | POA: Diagnosis not present

## 2016-10-12 DIAGNOSIS — I272 Pulmonary hypertension, unspecified: Secondary | ICD-10-CM | POA: Diagnosis not present

## 2016-10-12 MED ORDER — IOPAMIDOL (ISOVUE-300) INJECTION 61%
INTRAVENOUS | Status: AC
  Filled 2016-10-12: qty 150

## 2016-10-12 MED ORDER — LIDOCAINE HCL (PF) 1 % IJ SOLN
INTRAMUSCULAR | Status: AC
  Filled 2016-10-12: qty 30

## 2016-10-12 MED ORDER — IOPAMIDOL (ISOVUE-300) INJECTION 61%
INTRAVENOUS | Status: AC
  Filled 2016-10-12: qty 50

## 2016-10-12 NOTE — Progress Notes (Signed)
Pt transferred to [facility] rehab by [facility]. Family at bedside. Pt awake and alert. In no distress.

## 2016-10-12 NOTE — Progress Notes (Signed)
Procedure cancelled per Dr. Estanislado Pandy due to pt receiving Eliquis. Pt to go [facility] rehab and will be rescheduled for angiogram at later date.

## 2016-10-12 NOTE — Progress Notes (Signed)
Patient ID: Anne Sanders, female   DOB: Jun 19, 1945, 71 y.o.   MRN: [id number]   Pt was scheduled today for OP cerebral arteriogram. Was discharged from [facility] this am to then be admitted to [facility] after procedure. She has arrived by ambulance to [facility] from [place]; family now at bedside.  Per MAR from hospital; pt is actively on Eliquis Last dose last pm. She was on Xarelto at home for "hx blood clot and A fib" per Dtr. Changed to Eliquis during recent hospitalization New CVA TRH Dr Denton Brick  Abnormal CTA - has been reviewed by Dr Estanislado Pandy Possible cerebral aneurysm(s). Need for evaluation with Cerebral arteriogram with possible treatment.   Will reschedule cerebral arteriogram. Pt will need to be off Eliquis 48 hrs prior to procedure to safely perform. Dr Estanislado Pandy did discuss with Dr Ronnie Derby  Plan: pt will be admitted to [facility] today (she will transport by ambulance from [place] to facility) Nursing staff will hear from IR scheduler for time and date At that time we will direct DC of Eliquis Start Plavix and ASA instead And go back on Eliquis after procedure.  This all will be scheduled and planned with RN when timing is appropriate  Pts family aware of plan and agreeable.

## 2016-10-12 NOTE — Progress Notes (Signed)
[facility] rehab to give report. Spoke with [place], states she can't take call at this time and will call back for report.

## 2016-10-12 NOTE — Progress Notes (Signed)
Pt arrive by [facility] to IR nurse station. Awake and alert. In no distress. Pt with slurred speech but able to answer questions appropriately. Follows simple command. Able to move all 4 extremities No skin breakdown noted. Ace wraps to lower legs bilaterally. Family at bedside.

## 2016-10-19 ENCOUNTER — Other Ambulatory Visit: Payer: Self-pay | Admitting: Radiology

## 2016-10-24 ENCOUNTER — Encounter (HOSPITAL_COMMUNITY): Payer: Self-pay

## 2016-10-24 ENCOUNTER — Ambulatory Visit (HOSPITAL_COMMUNITY)
Admission: RE | Admit: 2016-10-24 | Discharge: 2016-10-24 | Disposition: A | Discharge status: Home / Self Care | Payer: Medicare Other | Source: Ambulatory Visit | Attending: Family Medicine | Admitting: Family Medicine

## 2016-10-24 DIAGNOSIS — R7989 Other specified abnormal findings of blood chemistry: Secondary | ICD-10-CM | POA: Diagnosis not present

## 2016-10-24 DIAGNOSIS — Z5309 Procedure and treatment not carried out because of other contraindication: Secondary | ICD-10-CM | POA: Diagnosis not present

## 2016-10-24 LAB — CBC
HEMATOCRIT: 41.7 % (ref 36.0–46.0)
HEMOGLOBIN: 13.3 g/dL (ref 12.0–15.0)
MCH: 29.3 pg (ref 26.0–34.0)
MCHC: 31.9 g/dL (ref 30.0–36.0)
MCV: 91.9 fL (ref 78.0–100.0)
Platelets: 143 10*3/uL — ABNORMAL LOW (ref 150–400)
RBC: 4.54 MIL/uL (ref 3.87–5.11)
RDW: 15.6 % — ABNORMAL HIGH (ref 11.5–15.5)
WBC: 7.7 10*3/uL (ref 4.0–10.5)

## 2016-10-24 LAB — PROTIME-INR
INR: 1.07
Prothrombin Time: 13.8 seconds (ref 11.4–15.2)

## 2016-10-24 LAB — BASIC METABOLIC PANEL
Anion gap: 10 (ref 5–15)
BUN: 52 mg/dL — AB (ref 6–20)
CHLORIDE: 94 mmol/L — AB (ref 101–111)
CO2: 34 mmol/L — AB (ref 22–32)
Calcium: 9.5 mg/dL (ref 8.9–10.3)
Creatinine, Ser: 2.67 mg/dL — ABNORMAL HIGH (ref 0.44–1.00)
GFR calc Af Amer: 20 mL/min — ABNORMAL LOW (ref >60)
GFR, EST NON AFRICAN AMERICAN: 17 mL/min — AB (ref >60)
Glucose, Bld: 129 mg/dL — ABNORMAL HIGH (ref 65–99)
Potassium: 4.2 mmol/L (ref 3.5–5.1)
SODIUM: 138 mmol/L (ref 135–145)

## 2016-10-24 LAB — APTT: aPTT: 28 seconds (ref 24–36)

## 2016-10-24 MED ORDER — IOPAMIDOL (ISOVUE-300) INJECTION 61%
INTRAVENOUS | Status: AC
  Filled 2016-10-24: qty 50

## 2016-10-24 MED ORDER — LIDOCAINE HCL (PF) 1 % IJ SOLN
INTRAMUSCULAR | Status: AC
  Filled 2016-10-24: qty 30

## 2016-10-24 MED ORDER — SODIUM CHLORIDE 0.9 % IV SOLN
Freq: Once | INTRAVENOUS | Status: AC
  Administered 2016-10-24: 250 mL via INTRAVENOUS

## 2016-10-24 MED ORDER — SODIUM CHLORIDE 0.9 % IV SOLN: INTRAVENOUS | Status: DC

## 2016-10-24 MED ORDER — IOPAMIDOL (ISOVUE-300) INJECTION 61%
INTRAVENOUS | Status: AC
  Filled 2016-10-24: qty 150

## 2016-10-24 NOTE — Discharge Instructions (Signed)
Cerebral Angiogram, Care After °Refer to this sheet in the next few weeks. These instructions provide you with information on caring for yourself after your procedure. Your health care provider may also give you more specific instructions. Your treatment has been planned according to current medical practices, but problems sometimes occur. Call your health care provider if you have any problems or questions after your procedure. °What can I expect after the procedure? °After your procedure, it is typical to have the following: °· Bruising at the catheter insertion site that usually fades within 1-2 weeks. °· Blood collecting in the tissue (hematoma) that may be painful to the touch. It should usually decrease in size and tenderness within 1-2 weeks. °· A mild headache. ° °Follow these instructions at home: °· Take medicines only as directed by your health care provider. °· You may shower 24-48 hours after the procedure or as directed by your health care provider. Remove the bandage (dressing) and gently wash the site with plain soap and water. Pat the area dry with a clean towel. Do not rub the site, because this may cause bleeding. °· Do not take baths, swim, or use a hot tub until your health care provider approves. °· Check your insertion site every day for redness, swelling, or drainage. °· Do not apply powder or lotion to the site. °· Do not lift over 10 lb (4.5 kg) for 5 days after your procedure or as directed by your health care provider. °· Ask your health care provider when it is okay to: °? Return to work or school. °? Resume usual physical activities or sports. °? Resume sexual activity. °· Do not drive home if you are discharged the same day as the procedure. Have someone else drive you. °· You may drive 24 hours after the procedure unless otherwise instructed by your health care provider. °· Do not operate machinery or power tools for 24 hours after the procedure or as directed by your health care  provider. °· If your procedure was done as an outpatient procedure, which means that you went home the same day as your procedure, a responsible adult should be with you for the first 24 hours after you arrive home. °· Keep all follow-up visits as directed by your health care provider. This is important. °Contact a health care provider if: °· You have a fever. °· You have chills. °· You have increased bleeding from the catheter insertion site. Hold pressure on the site. °Get help right away if: °· You have vision changes or loss of vision. °· You have numbness or weakness on one side of your body. °· You have difficulty talking, or you have slurred speech or cannot speak (aphasia). °· You feel confused or have difficulty remembering. °· You have unusual pain at the catheter insertion site. °· You have redness, warmth, or swelling at the catheter insertion site. °· You have drainage (other than a small amount of blood on the dressing) from the catheter insertion site. °· The catheter insertion site is bleeding, and the bleeding does not stop after 30 minutes of holding steady pressure on the site. °These symptoms may represent a serious problem that is an emergency. Do not wait to see if the symptoms will go away. Get medical help right away. Call your local emergency services (911 in [place] Do not drive yourself to the hospital. °This information is not intended to replace advice given to you by your health care provider. Make sure you discuss any questions   you have with your health care provider. °Document Released: 06/23/2013 Document Revised: 07/15/2015 Document Reviewed: 02/19/2013 °Elsevier Interactive Patient Education © 2017 [employer] °Moderate Conscious Sedation, Adult, Care After °These instructions provide you with information about caring for yourself after your procedure. Your health care provider may also give you more specific instructions. Your treatment has been planned according to current  medical practices, but problems sometimes occur. Call your health care provider if you have any problems or questions after your procedure. °What can I expect after the procedure? °After your procedure, it is common: °· To feel sleepy for several hours. °· To feel clumsy and have poor balance for several hours. °· To have poor judgment for several hours. °· To vomit if you eat too soon. ° °Follow these instructions at home: °For at least 24 hours after the procedure: ° °· Do not: °? Participate in activities where you could fall or become injured. °? Drive. °? Use heavy machinery. °? Drink alcohol. °? Take sleeping pills or medicines that cause drowsiness. °? Make important decisions or sign legal documents. °? Take care of children on your own. °· Rest. °Eating and drinking °· Follow the diet recommended by your health care provider. °· If you vomit: °? Drink water, juice, or soup when you can drink without vomiting. °? Make sure you have little or no nausea before eating solid foods. °General instructions °· Have a responsible adult stay with you until you are awake and alert. °· Take over-the-counter and prescription medicines only as told by your health care provider. °· If you smoke, do not smoke without supervision. °· Keep all follow-up visits as told by your health care provider. This is important. °Contact a health care provider if: °· You keep feeling nauseous or you keep vomiting. °· You feel light-headed. °· You develop a rash. °· You have a fever. °Get help right away if: °· You have trouble breathing. °This information is not intended to replace advice given to you by your health care provider. Make sure you discuss any questions you have with your health care provider. °Document Released: 11/27/2012 Document Revised: 07/12/2015 Document Reviewed: 05/29/2015 °Elsevier Interactive Patient Education © 2018 [employer] ° °

## 2016-10-24 NOTE — Sedation Documentation (Addendum)
Procedure cancelled per Dr. Estanislado Pandy due to kidney function. Pt D/C home in wheelchair with daughters. Transferred back to [facility] by transport. Pt awake and alert. In no distress.

## 2016-10-24 NOTE — Progress Notes (Signed)
Patient presents today for a cerebral angiogram due to recent CVA from [facility].  Unfortunately the patient's creatinine today is 2.67.  The last value we have in our system is 1.5 seven months ago.  I do not a recent Cr from when she was admitted at [facility].  Because of the use of contrast and her current kidney function, we will have to hold on her procedure as we can not proceed safely.  We will give her 250cc of NS prior to return to her facility.  She will need a nephrology consult once she returns to address her kidney function and status.    Sashay Felling E 8:46 AM 10/24/2016

## 2016-10-28 DIAGNOSIS — Z86711 Personal history of pulmonary embolism: Secondary | ICD-10-CM | POA: Diagnosis not present

## 2016-10-28 DIAGNOSIS — I639 Cerebral infarction, unspecified: Secondary | ICD-10-CM | POA: Diagnosis not present

## 2016-10-28 DIAGNOSIS — Z85118 Personal history of other malignant neoplasm of bronchus and lung: Secondary | ICD-10-CM | POA: Diagnosis not present

## 2016-10-28 DIAGNOSIS — E10649 Type 1 diabetes mellitus with hypoglycemia without coma: Secondary | ICD-10-CM | POA: Diagnosis not present

## 2016-10-28 DIAGNOSIS — I4891 Unspecified atrial fibrillation: Secondary | ICD-10-CM | POA: Diagnosis not present

## 2016-10-28 DIAGNOSIS — E7800 Pure hypercholesterolemia, unspecified: Secondary | ICD-10-CM | POA: Diagnosis not present

## 2016-10-28 DIAGNOSIS — E11649 Type 2 diabetes mellitus with hypoglycemia without coma: Secondary | ICD-10-CM | POA: Diagnosis not present

## 2016-10-28 DIAGNOSIS — E162 Hypoglycemia, unspecified: Secondary | ICD-10-CM | POA: Diagnosis not present

## 2016-10-28 DIAGNOSIS — K219 Gastro-esophageal reflux disease without esophagitis: Secondary | ICD-10-CM | POA: Diagnosis not present

## 2016-10-28 DIAGNOSIS — I10 Essential (primary) hypertension: Secondary | ICD-10-CM | POA: Diagnosis not present

## 2016-10-28 DIAGNOSIS — T68XXXA Hypothermia, initial encounter: Secondary | ICD-10-CM | POA: Diagnosis not present

## 2016-10-28 DIAGNOSIS — R0602 Shortness of breath: Secondary | ICD-10-CM | POA: Diagnosis not present

## 2016-10-28 DIAGNOSIS — E1122 Type 2 diabetes mellitus with diabetic chronic kidney disease: Secondary | ICD-10-CM | POA: Diagnosis not present

## 2016-10-28 DIAGNOSIS — J449 Chronic obstructive pulmonary disease, unspecified: Secondary | ICD-10-CM | POA: Diagnosis not present

## 2016-10-28 DIAGNOSIS — Z794 Long term (current) use of insulin: Secondary | ICD-10-CM | POA: Diagnosis not present

## 2016-10-28 DIAGNOSIS — I480 Paroxysmal atrial fibrillation: Secondary | ICD-10-CM | POA: Diagnosis not present

## 2016-10-28 DIAGNOSIS — I130 Hypertensive heart and chronic kidney disease with heart failure and stage 1 through stage 4 chronic kidney disease, or unspecified chronic kidney disease: Secondary | ICD-10-CM | POA: Diagnosis not present

## 2016-10-28 DIAGNOSIS — I509 Heart failure, unspecified: Secondary | ICD-10-CM | POA: Diagnosis not present

## 2016-10-28 DIAGNOSIS — N183 Chronic kidney disease, stage 3 (moderate): Secondary | ICD-10-CM | POA: Diagnosis not present

## 2016-10-28 DIAGNOSIS — I482 Chronic atrial fibrillation: Secondary | ICD-10-CM | POA: Diagnosis not present

## 2016-10-28 DIAGNOSIS — N179 Acute kidney failure, unspecified: Secondary | ICD-10-CM | POA: Diagnosis not present

## 2016-10-28 DIAGNOSIS — I69991 Dysphagia following unspecified cerebrovascular disease: Secondary | ICD-10-CM | POA: Diagnosis not present

## 2016-10-28 DIAGNOSIS — I671 Cerebral aneurysm, nonruptured: Secondary | ICD-10-CM | POA: Diagnosis not present

## 2016-10-28 DIAGNOSIS — Z87891 Personal history of nicotine dependence: Secondary | ICD-10-CM | POA: Diagnosis not present

## 2016-10-28 DIAGNOSIS — R1310 Dysphagia, unspecified: Secondary | ICD-10-CM | POA: Diagnosis not present

## 2016-10-28 DIAGNOSIS — E1165 Type 2 diabetes mellitus with hyperglycemia: Secondary | ICD-10-CM | POA: Diagnosis not present

## 2016-10-28 DIAGNOSIS — Z86718 Personal history of other venous thrombosis and embolism: Secondary | ICD-10-CM | POA: Diagnosis not present

## 2016-10-28 DIAGNOSIS — R030 Elevated blood-pressure reading, without diagnosis of hypertension: Secondary | ICD-10-CM | POA: Diagnosis not present

## 2016-10-28 DIAGNOSIS — R001 Bradycardia, unspecified: Secondary | ICD-10-CM | POA: Diagnosis not present

## 2016-10-28 DIAGNOSIS — Z79899 Other long term (current) drug therapy: Secondary | ICD-10-CM | POA: Diagnosis not present

## 2016-10-28 DIAGNOSIS — R680 Hypothermia, not associated with low environmental temperature: Secondary | ICD-10-CM | POA: Diagnosis not present

## 2016-10-28 DIAGNOSIS — I499 Cardiac arrhythmia, unspecified: Secondary | ICD-10-CM | POA: Diagnosis not present

## 2016-10-28 DIAGNOSIS — I272 Pulmonary hypertension, unspecified: Secondary | ICD-10-CM | POA: Diagnosis not present

## 2016-10-28 DIAGNOSIS — I69992 Facial weakness following unspecified cerebrovascular disease: Secondary | ICD-10-CM | POA: Diagnosis not present

## 2016-10-29 DIAGNOSIS — N183 Chronic kidney disease, stage 3 (moderate): Secondary | ICD-10-CM | POA: Diagnosis not present

## 2016-10-29 DIAGNOSIS — E162 Hypoglycemia, unspecified: Secondary | ICD-10-CM | POA: Diagnosis not present

## 2016-10-29 DIAGNOSIS — I4891 Unspecified atrial fibrillation: Secondary | ICD-10-CM | POA: Diagnosis not present

## 2016-10-29 DIAGNOSIS — I10 Essential (primary) hypertension: Secondary | ICD-10-CM | POA: Diagnosis not present

## 2016-10-29 DIAGNOSIS — J449 Chronic obstructive pulmonary disease, unspecified: Secondary | ICD-10-CM | POA: Diagnosis not present

## 2016-10-29 DIAGNOSIS — I639 Cerebral infarction, unspecified: Secondary | ICD-10-CM

## 2016-10-29 DIAGNOSIS — T68XXXA Hypothermia, initial encounter: Secondary | ICD-10-CM | POA: Diagnosis not present

## 2016-10-29 DIAGNOSIS — K219 Gastro-esophageal reflux disease without esophagitis: Secondary | ICD-10-CM | POA: Diagnosis not present

## 2016-10-29 DIAGNOSIS — E1165 Type 2 diabetes mellitus with hyperglycemia: Secondary | ICD-10-CM | POA: Diagnosis not present

## 2016-11-05 DIAGNOSIS — E1169 Type 2 diabetes mellitus with other specified complication: Secondary | ICD-10-CM | POA: Diagnosis not present

## 2016-11-05 DIAGNOSIS — I10 Essential (primary) hypertension: Secondary | ICD-10-CM | POA: Diagnosis not present

## 2016-11-05 DIAGNOSIS — M6281 Muscle weakness (generalized): Secondary | ICD-10-CM | POA: Diagnosis not present

## 2016-11-05 DIAGNOSIS — R262 Difficulty in walking, not elsewhere classified: Secondary | ICD-10-CM | POA: Diagnosis not present

## 2016-11-08 DIAGNOSIS — D649 Anemia, unspecified: Secondary | ICD-10-CM | POA: Diagnosis not present

## 2016-11-08 DIAGNOSIS — E119 Type 2 diabetes mellitus without complications: Secondary | ICD-10-CM | POA: Diagnosis not present

## 2016-11-08 DIAGNOSIS — Z79899 Other long term (current) drug therapy: Secondary | ICD-10-CM | POA: Diagnosis not present

## 2016-11-08 DIAGNOSIS — N183 Chronic kidney disease, stage 3 (moderate): Secondary | ICD-10-CM | POA: Diagnosis not present

## 2016-11-08 DIAGNOSIS — C3490 Malignant neoplasm of unspecified part of unspecified bronchus or lung: Secondary | ICD-10-CM | POA: Diagnosis not present

## 2016-11-08 DIAGNOSIS — M109 Gout, unspecified: Secondary | ICD-10-CM | POA: Diagnosis not present

## 2016-11-09 DIAGNOSIS — L97818 Non-pressure chronic ulcer of other part of right lower leg with other specified severity: Secondary | ICD-10-CM | POA: Diagnosis not present

## 2016-11-09 DIAGNOSIS — E1122 Type 2 diabetes mellitus with diabetic chronic kidney disease: Secondary | ICD-10-CM | POA: Diagnosis not present

## 2016-11-09 DIAGNOSIS — I129 Hypertensive chronic kidney disease with stage 1 through stage 4 chronic kidney disease, or unspecified chronic kidney disease: Secondary | ICD-10-CM | POA: Diagnosis not present

## 2016-11-09 DIAGNOSIS — I69314 Frontal lobe and executive function deficit following cerebral infarction: Secondary | ICD-10-CM | POA: Diagnosis not present

## 2016-11-09 DIAGNOSIS — L89322 Pressure ulcer of left buttock, stage 2: Secondary | ICD-10-CM | POA: Diagnosis not present

## 2016-11-09 DIAGNOSIS — N183 Chronic kidney disease, stage 3 (moderate): Secondary | ICD-10-CM | POA: Diagnosis not present

## 2016-11-09 DIAGNOSIS — I872 Venous insufficiency (chronic) (peripheral): Secondary | ICD-10-CM | POA: Diagnosis not present

## 2016-11-09 DIAGNOSIS — I482 Chronic atrial fibrillation: Secondary | ICD-10-CM | POA: Diagnosis not present

## 2016-11-09 DIAGNOSIS — R1310 Dysphagia, unspecified: Secondary | ICD-10-CM | POA: Diagnosis not present

## 2016-11-09 DIAGNOSIS — I69391 Dysphagia following cerebral infarction: Secondary | ICD-10-CM | POA: Diagnosis not present

## 2016-11-13 DIAGNOSIS — L89322 Pressure ulcer of left buttock, stage 2: Secondary | ICD-10-CM | POA: Diagnosis not present

## 2016-11-13 DIAGNOSIS — I69314 Frontal lobe and executive function deficit following cerebral infarction: Secondary | ICD-10-CM | POA: Diagnosis not present

## 2016-11-13 DIAGNOSIS — N183 Chronic kidney disease, stage 3 (moderate): Secondary | ICD-10-CM | POA: Diagnosis not present

## 2016-11-13 DIAGNOSIS — L97818 Non-pressure chronic ulcer of other part of right lower leg with other specified severity: Secondary | ICD-10-CM | POA: Diagnosis not present

## 2016-11-13 DIAGNOSIS — I69391 Dysphagia following cerebral infarction: Secondary | ICD-10-CM | POA: Diagnosis not present

## 2016-11-13 DIAGNOSIS — R1310 Dysphagia, unspecified: Secondary | ICD-10-CM | POA: Diagnosis not present

## 2016-11-13 DIAGNOSIS — I872 Venous insufficiency (chronic) (peripheral): Secondary | ICD-10-CM | POA: Diagnosis not present

## 2016-11-13 DIAGNOSIS — I129 Hypertensive chronic kidney disease with stage 1 through stage 4 chronic kidney disease, or unspecified chronic kidney disease: Secondary | ICD-10-CM | POA: Diagnosis not present

## 2016-11-13 DIAGNOSIS — I482 Chronic atrial fibrillation: Secondary | ICD-10-CM | POA: Diagnosis not present

## 2016-11-13 DIAGNOSIS — E1122 Type 2 diabetes mellitus with diabetic chronic kidney disease: Secondary | ICD-10-CM | POA: Diagnosis not present

## 2016-11-14 DIAGNOSIS — I482 Chronic atrial fibrillation: Secondary | ICD-10-CM | POA: Diagnosis not present

## 2016-11-14 DIAGNOSIS — I129 Hypertensive chronic kidney disease with stage 1 through stage 4 chronic kidney disease, or unspecified chronic kidney disease: Secondary | ICD-10-CM | POA: Diagnosis not present

## 2016-11-14 DIAGNOSIS — L89322 Pressure ulcer of left buttock, stage 2: Secondary | ICD-10-CM | POA: Diagnosis not present

## 2016-11-14 DIAGNOSIS — I872 Venous insufficiency (chronic) (peripheral): Secondary | ICD-10-CM | POA: Diagnosis not present

## 2016-11-14 DIAGNOSIS — E1122 Type 2 diabetes mellitus with diabetic chronic kidney disease: Secondary | ICD-10-CM | POA: Diagnosis not present

## 2016-11-14 DIAGNOSIS — L97818 Non-pressure chronic ulcer of other part of right lower leg with other specified severity: Secondary | ICD-10-CM | POA: Diagnosis not present

## 2016-11-14 DIAGNOSIS — R1310 Dysphagia, unspecified: Secondary | ICD-10-CM | POA: Diagnosis not present

## 2016-11-14 DIAGNOSIS — N183 Chronic kidney disease, stage 3 (moderate): Secondary | ICD-10-CM | POA: Diagnosis not present

## 2016-11-14 DIAGNOSIS — I69391 Dysphagia following cerebral infarction: Secondary | ICD-10-CM | POA: Diagnosis not present

## 2016-11-14 DIAGNOSIS — I69314 Frontal lobe and executive function deficit following cerebral infarction: Secondary | ICD-10-CM | POA: Diagnosis not present

## 2016-11-17 DIAGNOSIS — L97818 Non-pressure chronic ulcer of other part of right lower leg with other specified severity: Secondary | ICD-10-CM | POA: Diagnosis not present

## 2016-11-17 DIAGNOSIS — E1122 Type 2 diabetes mellitus with diabetic chronic kidney disease: Secondary | ICD-10-CM | POA: Diagnosis not present

## 2016-11-17 DIAGNOSIS — N183 Chronic kidney disease, stage 3 (moderate): Secondary | ICD-10-CM | POA: Diagnosis not present

## 2016-11-17 DIAGNOSIS — I482 Chronic atrial fibrillation: Secondary | ICD-10-CM | POA: Diagnosis not present

## 2016-11-17 DIAGNOSIS — R1310 Dysphagia, unspecified: Secondary | ICD-10-CM | POA: Diagnosis not present

## 2016-11-17 DIAGNOSIS — I129 Hypertensive chronic kidney disease with stage 1 through stage 4 chronic kidney disease, or unspecified chronic kidney disease: Secondary | ICD-10-CM | POA: Diagnosis not present

## 2016-11-17 DIAGNOSIS — I69314 Frontal lobe and executive function deficit following cerebral infarction: Secondary | ICD-10-CM | POA: Diagnosis not present

## 2016-11-17 DIAGNOSIS — I872 Venous insufficiency (chronic) (peripheral): Secondary | ICD-10-CM | POA: Diagnosis not present

## 2016-11-17 DIAGNOSIS — I69391 Dysphagia following cerebral infarction: Secondary | ICD-10-CM | POA: Diagnosis not present

## 2016-11-17 DIAGNOSIS — L89322 Pressure ulcer of left buttock, stage 2: Secondary | ICD-10-CM | POA: Diagnosis not present

## 2016-12-12 ENCOUNTER — Ambulatory Visit: Payer: Medicare Other | Admitting: Neurology

## 2017-01-08 ENCOUNTER — Ambulatory Visit
Admission: RE | Admit: 2017-01-08 | Discharge: 2017-01-08 | Disposition: A | Discharge status: Home / Self Care | Payer: Medicaid Other | Source: Ambulatory Visit | Attending: Radiation Oncology | Admitting: Radiation Oncology

## 2017-01-08 ENCOUNTER — Telehealth: Payer: Self-pay | Admitting: *Deleted

## 2017-01-08 ENCOUNTER — Other Ambulatory Visit: Payer: Self-pay | Admitting: Radiation Oncology

## 2017-01-08 DIAGNOSIS — C3412 Malignant neoplasm of upper lobe, left bronchus or lung: Secondary | ICD-10-CM

## 2017-01-08 DIAGNOSIS — C3411 Malignant neoplasm of upper lobe, right bronchus or lung: Secondary | ICD-10-CM

## 2017-01-08 NOTE — Telephone Encounter (Signed)
Called patient to inform that she doesn't need to come in for fu visit today, she will get another scan and then a fu visit with [name], spoke with patient's daughter- [name] and she is aware of this

## 2018-03-05 ENCOUNTER — Telehealth: Payer: Self-pay | Admitting: *Deleted

## 2018-03-05 NOTE — Telephone Encounter (Signed)
CALLED PATIENT TO ASK ABOUT DOING SCAN AND FU WITH ALISON PERKINS, SPOKE WITH PATIENT'S DAUGHTER - [name] AND SHE WISHES TO TALK TO HER SISTER- SHE FEELS THAT HER MOM WILL NOT BE ABLE TO DO THESE APPTS. DUE TO DEMENTIA AND STROKE, SHE WILL TALK TO HER SISTER AND THEY WILL MAKE A DECISION AND CALL ME BACK WITH HOW TO PROCEED

## 2018-03-21 DIAGNOSIS — K219 Gastro-esophageal reflux disease without esophagitis: Secondary | ICD-10-CM

## 2018-03-21 DIAGNOSIS — C3490 Malignant neoplasm of unspecified part of unspecified bronchus or lung: Secondary | ICD-10-CM

## 2018-03-21 DIAGNOSIS — N179 Acute kidney failure, unspecified: Secondary | ICD-10-CM | POA: Diagnosis not present

## 2018-03-21 DIAGNOSIS — E1165 Type 2 diabetes mellitus with hyperglycemia: Secondary | ICD-10-CM

## 2018-03-21 DIAGNOSIS — J449 Chronic obstructive pulmonary disease, unspecified: Secondary | ICD-10-CM

## 2018-03-21 DIAGNOSIS — J9690 Respiratory failure, unspecified, unspecified whether with hypoxia or hypercapnia: Secondary | ICD-10-CM

## 2018-03-21 DIAGNOSIS — I4891 Unspecified atrial fibrillation: Secondary | ICD-10-CM

## 2018-03-22 DIAGNOSIS — J441 Chronic obstructive pulmonary disease with (acute) exacerbation: Secondary | ICD-10-CM

## 2018-03-22 DIAGNOSIS — I4891 Unspecified atrial fibrillation: Secondary | ICD-10-CM | POA: Diagnosis not present

## 2018-03-22 DIAGNOSIS — N179 Acute kidney failure, unspecified: Secondary | ICD-10-CM | POA: Diagnosis not present

## 2018-03-22 DIAGNOSIS — G4733 Obstructive sleep apnea (adult) (pediatric): Secondary | ICD-10-CM

## 2018-03-22 DIAGNOSIS — N183 Chronic kidney disease, stage 3 (moderate): Secondary | ICD-10-CM

## 2018-03-22 DIAGNOSIS — C3490 Malignant neoplasm of unspecified part of unspecified bronchus or lung: Secondary | ICD-10-CM | POA: Diagnosis not present

## 2018-03-22 DIAGNOSIS — J9690 Respiratory failure, unspecified, unspecified whether with hypoxia or hypercapnia: Secondary | ICD-10-CM | POA: Diagnosis not present

## 2018-03-22 DIAGNOSIS — I509 Heart failure, unspecified: Secondary | ICD-10-CM

## 2018-03-23 ENCOUNTER — Inpatient Hospital Stay (HOSPITAL_COMMUNITY)
Admission: AD | Admit: 2018-03-23 | Discharge: 2018-04-21 | DRG: 871 | Disposition: E | Payer: Medicare Other | Source: Other Acute Inpatient Hospital | Attending: Pulmonary Disease | Admitting: Pulmonary Disease

## 2018-03-23 ENCOUNTER — Inpatient Hospital Stay (HOSPITAL_COMMUNITY): Payer: Medicare Other

## 2018-03-23 DIAGNOSIS — Z7982 Long term (current) use of aspirin: Secondary | ICD-10-CM

## 2018-03-23 DIAGNOSIS — C3411 Malignant neoplasm of upper lobe, right bronchus or lung: Secondary | ICD-10-CM | POA: Diagnosis present

## 2018-03-23 DIAGNOSIS — I509 Heart failure, unspecified: Secondary | ICD-10-CM | POA: Diagnosis present

## 2018-03-23 DIAGNOSIS — R918 Other nonspecific abnormal finding of lung field: Secondary | ICD-10-CM | POA: Diagnosis present

## 2018-03-23 DIAGNOSIS — I130 Hypertensive heart and chronic kidney disease with heart failure and stage 1 through stage 4 chronic kidney disease, or unspecified chronic kidney disease: Secondary | ICD-10-CM | POA: Diagnosis not present

## 2018-03-23 DIAGNOSIS — A4189 Other specified sepsis: Secondary | ICD-10-CM | POA: Diagnosis present

## 2018-03-23 DIAGNOSIS — Z8673 Personal history of transient ischemic attack (TIA), and cerebral infarction without residual deficits: Secondary | ICD-10-CM

## 2018-03-23 DIAGNOSIS — E1122 Type 2 diabetes mellitus with diabetic chronic kidney disease: Secondary | ICD-10-CM | POA: Diagnosis present

## 2018-03-23 DIAGNOSIS — J9621 Acute and chronic respiratory failure with hypoxia: Secondary | ICD-10-CM | POA: Diagnosis not present

## 2018-03-23 DIAGNOSIS — I4891 Unspecified atrial fibrillation: Secondary | ICD-10-CM | POA: Diagnosis not present

## 2018-03-23 DIAGNOSIS — N179 Acute kidney failure, unspecified: Secondary | ICD-10-CM | POA: Diagnosis not present

## 2018-03-23 DIAGNOSIS — Z90710 Acquired absence of both cervix and uterus: Secondary | ICD-10-CM

## 2018-03-23 DIAGNOSIS — Z794 Long term (current) use of insulin: Secondary | ICD-10-CM

## 2018-03-23 DIAGNOSIS — M109 Gout, unspecified: Secondary | ICD-10-CM | POA: Diagnosis present

## 2018-03-23 DIAGNOSIS — F419 Anxiety disorder, unspecified: Secondary | ICD-10-CM | POA: Diagnosis not present

## 2018-03-23 DIAGNOSIS — B9689 Other specified bacterial agents as the cause of diseases classified elsewhere: Secondary | ICD-10-CM | POA: Diagnosis not present

## 2018-03-23 DIAGNOSIS — K219 Gastro-esophageal reflux disease without esophagitis: Secondary | ICD-10-CM | POA: Diagnosis not present

## 2018-03-23 DIAGNOSIS — C3480 Malignant neoplasm of overlapping sites of unspecified bronchus and lung: Secondary | ICD-10-CM

## 2018-03-23 DIAGNOSIS — G4710 Hypersomnia, unspecified: Secondary | ICD-10-CM | POA: Diagnosis present

## 2018-03-23 DIAGNOSIS — J189 Pneumonia, unspecified organism: Secondary | ICD-10-CM | POA: Diagnosis present

## 2018-03-23 DIAGNOSIS — Z7902 Long term (current) use of antithrombotics/antiplatelets: Secondary | ICD-10-CM

## 2018-03-23 DIAGNOSIS — Z79899 Other long term (current) drug therapy: Secondary | ICD-10-CM

## 2018-03-23 DIAGNOSIS — N183 Chronic kidney disease, stage 3 (moderate): Secondary | ICD-10-CM | POA: Diagnosis not present

## 2018-03-23 DIAGNOSIS — J869 Pyothorax without fistula: Secondary | ICD-10-CM | POA: Diagnosis present

## 2018-03-23 DIAGNOSIS — C3412 Malignant neoplasm of upper lobe, left bronchus or lung: Secondary | ICD-10-CM | POA: Diagnosis present

## 2018-03-23 DIAGNOSIS — J9620 Acute and chronic respiratory failure, unspecified whether with hypoxia or hypercapnia: Secondary | ICD-10-CM | POA: Diagnosis present

## 2018-03-23 DIAGNOSIS — R6520 Severe sepsis without septic shock: Secondary | ICD-10-CM | POA: Diagnosis present

## 2018-03-23 DIAGNOSIS — J441 Chronic obstructive pulmonary disease with (acute) exacerbation: Secondary | ICD-10-CM | POA: Diagnosis not present

## 2018-03-23 DIAGNOSIS — C7801 Secondary malignant neoplasm of right lung: Secondary | ICD-10-CM | POA: Diagnosis not present

## 2018-03-23 DIAGNOSIS — J440 Chronic obstructive pulmonary disease with acute lower respiratory infection: Secondary | ICD-10-CM | POA: Diagnosis not present

## 2018-03-23 DIAGNOSIS — Z86711 Personal history of pulmonary embolism: Secondary | ICD-10-CM

## 2018-03-23 DIAGNOSIS — Z87891 Personal history of nicotine dependence: Secondary | ICD-10-CM

## 2018-03-23 DIAGNOSIS — Z515 Encounter for palliative care: Secondary | ICD-10-CM | POA: Diagnosis not present

## 2018-03-23 DIAGNOSIS — F0391 Unspecified dementia with behavioral disturbance: Secondary | ICD-10-CM | POA: Diagnosis present

## 2018-03-23 DIAGNOSIS — J9601 Acute respiratory failure with hypoxia: Secondary | ICD-10-CM | POA: Diagnosis present

## 2018-03-23 DIAGNOSIS — G4730 Sleep apnea, unspecified: Secondary | ICD-10-CM | POA: Diagnosis present

## 2018-03-23 DIAGNOSIS — Z66 Do not resuscitate: Secondary | ICD-10-CM | POA: Diagnosis present

## 2018-03-23 DIAGNOSIS — Z9989 Dependence on other enabling machines and devices: Secondary | ICD-10-CM

## 2018-03-23 DIAGNOSIS — Z923 Personal history of irradiation: Secondary | ICD-10-CM

## 2018-03-23 DIAGNOSIS — Z8249 Family history of ischemic heart disease and other diseases of the circulatory system: Secondary | ICD-10-CM

## 2018-03-23 DIAGNOSIS — E785 Hyperlipidemia, unspecified: Secondary | ICD-10-CM | POA: Diagnosis not present

## 2018-03-23 DIAGNOSIS — A419 Sepsis, unspecified organism: Secondary | ICD-10-CM | POA: Diagnosis present

## 2018-03-23 DIAGNOSIS — Z86718 Personal history of other venous thrombosis and embolism: Secondary | ICD-10-CM

## 2018-03-23 DIAGNOSIS — R7881 Bacteremia: Secondary | ICD-10-CM | POA: Diagnosis present

## 2018-03-23 DIAGNOSIS — Z7901 Long term (current) use of anticoagulants: Secondary | ICD-10-CM

## 2018-03-23 DIAGNOSIS — C3490 Malignant neoplasm of unspecified part of unspecified bronchus or lung: Secondary | ICD-10-CM | POA: Diagnosis not present

## 2018-03-23 DIAGNOSIS — J9690 Respiratory failure, unspecified, unspecified whether with hypoxia or hypercapnia: Secondary | ICD-10-CM | POA: Diagnosis not present

## 2018-03-23 DIAGNOSIS — J159 Unspecified bacterial pneumonia: Secondary | ICD-10-CM

## 2018-03-23 HISTORY — DX: Unspecified bacterial pneumonia: J15.9

## 2018-03-23 LAB — CBC WITH DIFFERENTIAL/PLATELET
Abs Immature Granulocytes: 0.93 K/uL — ABNORMAL HIGH (ref 0.00–0.07)
Basophils Absolute: 0 K/uL (ref 0.0–0.1)
Basophils Relative: 0 %
Eosinophils Absolute: 0.1 K/uL (ref 0.0–0.5)
Eosinophils Relative: 1 %
HCT: 37.5 % (ref 36.0–46.0)
Hemoglobin: 11.3 g/dL — ABNORMAL LOW (ref 12.0–15.0)
Immature Granulocytes: 9 %
Lymphocytes Relative: 4 %
Lymphs Abs: 0.4 K/uL — ABNORMAL LOW (ref 0.7–4.0)
MCH: 28.3 pg (ref 26.0–34.0)
MCHC: 30.1 g/dL (ref 30.0–36.0)
MCV: 94 fL (ref 80.0–100.0)
Monocytes Absolute: 0.5 K/uL (ref 0.1–1.0)
Monocytes Relative: 5 %
Neutro Abs: 9 K/uL — ABNORMAL HIGH (ref 1.7–7.7)
Neutrophils Relative %: 81 %
Platelets: 101 K/uL — ABNORMAL LOW (ref 150–400)
RBC: 3.99 MIL/uL (ref 3.87–5.11)
RDW: 15.5 % (ref 11.5–15.5)
WBC: 11 K/uL — ABNORMAL HIGH (ref 4.0–10.5)
nRBC: 0 % (ref 0.0–0.2)

## 2018-03-23 LAB — COMPREHENSIVE METABOLIC PANEL WITH GFR
ALT: 23 U/L (ref 0–44)
AST: 37 U/L (ref 15–41)
Albumin: 1.8 g/dL — ABNORMAL LOW (ref 3.5–5.0)
Alkaline Phosphatase: 76 U/L (ref 38–126)
Anion gap: 15 (ref 5–15)
BUN: 78 mg/dL — ABNORMAL HIGH (ref 8–23)
CO2: 20 mmol/L — ABNORMAL LOW (ref 22–32)
Calcium: 8 mg/dL — ABNORMAL LOW (ref 8.9–10.3)
Chloride: 111 mmol/L (ref 98–111)
Creatinine, Ser: 4.64 mg/dL — ABNORMAL HIGH (ref 0.44–1.00)
GFR calc Af Amer: 10 mL/min — ABNORMAL LOW
GFR calc non Af Amer: 9 mL/min — ABNORMAL LOW
Glucose, Bld: 281 mg/dL — ABNORMAL HIGH (ref 70–99)
Potassium: 4.3 mmol/L (ref 3.5–5.1)
Sodium: 146 mmol/L — ABNORMAL HIGH (ref 135–145)
Total Bilirubin: 1 mg/dL (ref 0.3–1.2)
Total Protein: 6.3 g/dL — ABNORMAL LOW (ref 6.5–8.1)

## 2018-03-23 LAB — GLUCOSE, CAPILLARY: Glucose-Capillary: 241 mg/dL — ABNORMAL HIGH (ref 70–99)

## 2018-03-23 LAB — PHOSPHORUS: Phosphorus: 6.3 mg/dL — ABNORMAL HIGH (ref 2.5–4.6)

## 2018-03-23 LAB — TROPONIN I: Troponin I: 0.1 ng/mL (ref <0.03)

## 2018-03-23 LAB — PROTIME-INR
INR: 1.59
Prothrombin Time: 18.8 s — ABNORMAL HIGH (ref 11.4–15.2)

## 2018-03-23 LAB — MRSA PCR SCREENING: MRSA by PCR: NEGATIVE

## 2018-03-23 LAB — MAGNESIUM: Magnesium: 2 mg/dL (ref 1.7–2.4)

## 2018-03-23 LAB — LACTIC ACID, PLASMA: Lactic Acid, Venous: 1.7 mmol/L (ref 0.5–1.9)

## 2018-03-23 LAB — APTT: aPTT: 35 s (ref 24–36)

## 2018-03-23 MED ORDER — POLYVINYL ALCOHOL 1.4 % OP SOLN
1.0000 [drp] | Freq: Four times a day (QID) | OPHTHALMIC | Status: DC | PRN
  Filled 2018-03-23: qty 15

## 2018-03-23 MED ORDER — FAMOTIDINE IN NACL 20-0.9 MG/50ML-% IV SOLN
20.0000 mg | Freq: Two times a day (BID) | INTRAVENOUS | Status: DC
  Filled 2018-03-23 (×2): qty 50

## 2018-03-23 MED ORDER — GLYCOPYRROLATE 1 MG PO TABS: 1.0000 mg | ORAL_TABLET | ORAL | Status: DC | PRN

## 2018-03-23 MED ORDER — MORPHINE BOLUS VIA INFUSION
5.0000 mg | INTRAVENOUS | Status: DC | PRN
  Administered 2018-03-23: 5 mg via INTRAVENOUS
  Filled 2018-03-23: qty 5

## 2018-03-23 MED ORDER — ACETAMINOPHEN 325 MG PO TABS: 650.0000 mg | ORAL_TABLET | Freq: Four times a day (QID) | ORAL | Status: DC | PRN

## 2018-03-23 MED ORDER — HEPARIN SODIUM (PORCINE) 5000 UNIT/ML IJ SOLN
5000.0000 [IU] | Freq: Three times a day (TID) | INTRAMUSCULAR | Status: DC
  Administered 2018-03-23: 5000 [IU] via SUBCUTANEOUS
  Filled 2018-03-23: qty 1

## 2018-03-23 MED ORDER — DEXTROSE 5 % IV SOLN: INTRAVENOUS | Status: DC

## 2018-03-23 MED ORDER — SODIUM CHLORIDE 0.9 % IV SOLN
1.0000 g | INTRAVENOUS | Status: DC
  Filled 2018-03-23: qty 1

## 2018-03-23 MED ORDER — ACETAMINOPHEN 325 MG PO TABS: 650.0000 mg | ORAL_TABLET | ORAL | Status: DC | PRN

## 2018-03-23 MED ORDER — GLYCOPYRROLATE 0.2 MG/ML IJ SOLN: 0.2000 mg | INTRAMUSCULAR | Status: DC | PRN

## 2018-03-23 MED ORDER — DIPHENHYDRAMINE HCL 50 MG/ML IJ SOLN: 25.0000 mg | INTRAMUSCULAR | Status: DC | PRN

## 2018-03-23 MED ORDER — IPRATROPIUM-ALBUTEROL 0.5-2.5 (3) MG/3ML IN SOLN: 3.0000 mL | Freq: Four times a day (QID) | RESPIRATORY_TRACT | Status: DC

## 2018-03-23 MED ORDER — VANCOMYCIN HCL IN DEXTROSE 1-5 GM/200ML-% IV SOLN: 1000.0000 mg | Freq: Once | INTRAVENOUS | Status: DC

## 2018-03-23 MED ORDER — ACETAMINOPHEN 650 MG RE SUPP: 650.0000 mg | Freq: Four times a day (QID) | RECTAL | Status: DC | PRN

## 2018-03-23 MED ORDER — MORPHINE 100MG IN NS 100ML (1MG/ML) PREMIX INFUSION
0.0000 mg/h | INTRAVENOUS | Status: DC
  Administered 2018-03-23: 5 mg/h via INTRAVENOUS
  Filled 2018-03-23 (×2): qty 100

## 2018-03-23 MED ORDER — METHYLPREDNISOLONE SODIUM SUCC 125 MG IJ SOLR
60.0000 mg | Freq: Three times a day (TID) | INTRAMUSCULAR | Status: DC
  Administered 2018-03-23: 60 mg via INTRAVENOUS
  Filled 2018-03-23: qty 2

## 2018-03-23 MED ORDER — LORAZEPAM 2 MG/ML IJ SOLN
2.0000 mg | INTRAMUSCULAR | Status: DC | PRN
  Administered 2018-03-23: 4 mg via INTRAVENOUS
  Filled 2018-03-23: qty 2

## 2018-03-23 MED ORDER — MORPHINE SULFATE (PF) 2 MG/ML IV SOLN: 2.0000 mg | INTRAVENOUS | Status: DC | PRN

## 2018-03-26 ENCOUNTER — Encounter (HOSPITAL_COMMUNITY): Payer: Self-pay | Admitting: Pulmonary Disease

## 2018-03-26 DIAGNOSIS — R7881 Bacteremia: Secondary | ICD-10-CM | POA: Diagnosis present

## 2018-03-26 DIAGNOSIS — J9620 Acute and chronic respiratory failure, unspecified whether with hypoxia or hypercapnia: Secondary | ICD-10-CM | POA: Diagnosis present

## 2018-03-26 DIAGNOSIS — J159 Unspecified bacterial pneumonia: Secondary | ICD-10-CM

## 2018-03-26 DIAGNOSIS — A419 Sepsis, unspecified organism: Secondary | ICD-10-CM | POA: Diagnosis present

## 2018-03-26 HISTORY — DX: Unspecified bacterial pneumonia: J15.9

## 2018-03-27 ENCOUNTER — Telehealth: Payer: Self-pay | Admitting: *Deleted

## 2018-03-27 NOTE — Telephone Encounter (Signed)
Received D/C from [facility]-D/C forwarded to Dr.Icard to be signed.

## 2018-03-28 LAB — CULTURE, BLOOD (ROUTINE X 2)
Culture: NO GROWTH
Culture: NO GROWTH
SPECIAL REQUESTS: ADEQUATE
Special Requests: ADEQUATE

## 2018-03-28 NOTE — Telephone Encounter (Signed)
Received signed D/C-D/C mailed to [place]. Along with faxing a copy to funeral home.

## 2018-04-21 NOTE — Death Summary Note (Signed)
DEATH SUMMARY   Patient Details  Name: Anne Sanders MRN: [id number] DOB: Sep 02, 1945  Admission/Discharge Information   Admit Date:  2018-04-13  Date of Death: Date of Death: 04/13/18  Time of Death: Time of Death: 1832-05-21  Length of Stay: 1  Referring Physician: Quincy Carnes, FNP   Reason(s) for Hospitalization  73 year old female resident of local nursing facility transferred to [facility] health for acute hypoxemic respiratory failure.  Patient was admitted to the hospital and treated for sepsis was found to have a right-sided complex pleural disease on chest x-ray and underwent thoracentesis which revealed an LDH of 2082-05-22 and a total protein of 3.5, cultures from the pleural fluid revealed gram-positive cocci as well as blood cultures positive for gram-positive cocci in clusters.  Patient was transferred here for recommendations and management of ongoing respiratory failure.  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)   Malignant neoplasm of right upper lobe of lung (HCC)   Acute hypoxemic respiratory failure (HCC)   Acute and chr resp failure, unsp w hypoxia or hypercapnia (HCC)   Pneumonia due to gram-positive bacteria   Gram-positive bacteremia   Sepsis [facility])   Brief Hospital Course (including significant findings, care, treatment, and services provided and events leading to death)  This is a 73 year old female with a past medical history of DVT/PE on Eliquis, atrial fibrillation, type 2 diabetes, gout, chronic obstructive pulmonary disease, gastroesophageal reflux disease, CKD stage III and CVA.  Also has a history of dementia on Aricept.  Additional history obtained by the daughter at bedside per documentation of  paperwork transferred from [place] states that she has a new diagnosis of metastatic lung cancer and a new lung mass that was recently diagnosed.  Recently being treated for bilateral pneumonia at her nursing facility.  But despite treatment patient continued to worsen aspect emergency department.  In emergency department she was found to be severely hypoxemic sats in the 70s and she was placed on nasal cannula oxygen supplementation.  Ultimately the patient was admitted to the hospital placed on Levaquin and given fluids for resuscitation.  Thoracentesis of the right-sided pleural fluid revealed an elevated LDH greater than 2000 and a protein of 3.5 and a white blood cell count of 10,000.  Cultures from the blood as well as the pleural fluid revealed gram-positive cocci in clusters.  She was started on vancomycin, Unasyn and Zithromax.  Patient was ultimately transferred here to [facility] for further recommendations and management.  Family discussion at bedside with patient's 2 daughters.  Family is very aware of the patient's significant medical problems and comorbidities to include advanced dementia, unable to care for herself, lives in a nursing facility most of the time does not recognize who they are at this point in her advanced disease process.  We also explained her significant respiratory failure and ongoing gram-positive cocci empyema and bacteremia and the fact that she was requiring full and total NIPPV support.  At this point they would like to honor her wishes of being a durable DNR and would prefer to make her comfortable.  They would like to wait on their brother to get to the hospital as well as the family's pastor.  I will place comfort care orders and notify nursing staff.  Patient passed peacefully in intensive care unit with family at bedside.  Pertinent Labs and Studies  Significant Diagnostic Studies Dg Chest Port 1 View  Result Date: 04-06-2018 CLINICAL DATA:  Hypoxia. EXAM:  PORTABLE CHEST 1 VIEW COMPARISON:  03/22/2018 FINDINGS: Pleural based opacity on right surrounds the right lung and obscures the right hemidiaphragm, most likely loculated pleural fluid. There are thickened interstitial markings in the right lung and hazy airspace opacity most noted in the mid to lower right. Opacity at the right lung base has increased compared to the previous day's exam I cleaned increased pleural fluid. There are chronic masslike opacities the left mid and upper lung stable from multiple prior exams. Remainder of the left lung is clear. There is nodular thickening along the right paratracheal stripe. Right hilum is not well-defined. This appearance is also unchanged from recent prior study. IMPRESSION: 1. Worsened lung aeration on the right. There is increased opacity at the right base now completely obscuring the hemidiaphragm. This is likely combination of increased pleural fluid and increased parenchymal opacity, the latter consistent with either atelectasis or infection. 2. Stable irregular interstitial thickening throughout right lung. 3. Given the appearance of the pleural based opacity surrounding the right lung, follow-up chest CT should be considered, best performed with contrast. Electronically Signed   By: Lajean Manes M.D.   On: 2018/04/06 14:42    Microbiology Recent Results (from the past 240 hour(s))  Culture, blood (routine x 2)     Status: None (Preliminary result)   Collection Time: 04-06-18  2:37 PM  Result Value Ref Range Status   Specimen Description BLOOD LEFT ANTECUBITAL  Final   Special Requests AEROBIC BOTTLE ONLY Blood Culture adequate volume  Final   Culture   Final    NO GROWTH 3 DAYS Performed at [facility], [address], [place], [place] [address]    Report Status PENDING  Incomplete  Culture, blood (routine x 2)     Status: None (Preliminary result)   Collection Time: April 06, 2018  2:37 PM  Result Value Ref Range Status   Specimen  Description BLOOD LEFT ANTECUBITAL  Final   Special Requests AEROBIC BOTTLE ONLY Blood Culture adequate volume  Final   Culture   Final    NO GROWTH 3 DAYS Performed at [facility], [place] [address], [place], [place] [address]    Report Status PENDING  Incomplete  MRSA PCR Screening     Status: None   Collection Time: 04/06/18  5:22 PM  Result Value Ref Range Status   MRSA by PCR NEGATIVE NEGATIVE Final    Comment:        The GeneXpert MRSA Assay (FDA approved for NASAL specimens only), is one component of a comprehensive MRSA colonization surveillance program. It is not intended to diagnose MRSA infection nor to guide or monitor treatment for MRSA infections. Performed at [facility], [place] [address], [place], [place] [address]     Lab Basic Metabolic Panel: Recent Labs  Lab 2018/04/06 1437  NA 146*  K 4.3  CL 111  CO2 20*  GLUCOSE 281*  BUN 78*  CREATININE 4.64*  CALCIUM 8.0*  MG 2.0  PHOS 6.3*   Liver Function Tests: Recent Labs  Lab 2018/04/06 1437  AST 37  ALT 23  ALKPHOS 76  BILITOT 1.0  PROT 6.3*  ALBUMIN 1.8*   No results for input(s): LIPASE, AMYLASE in the last 168 hours. No results for input(s): AMMONIA in the  last 168 hours. CBC: Recent Labs  Lab 2018-04-02 1437  WBC 11.0*  NEUTROABS 9.0*  HGB 11.3*  HCT 37.5  MCV 94.0  PLT 101*   Cardiac Enzymes: Recent Labs  Lab 2018-04-02 1437  TROPONINI 0.10*   Sepsis Labs: Recent Labs  Lab 04-02-18 1437  WBC 11.0*  LATICACIDVEN 1.7    Procedures/Operations   NIPPV    L  03/26/2018, 7:39 PM

## 2018-04-21 NOTE — Consult Note (Signed)
      [address]       [place] [address]             [phone]          CARDIOTHORACIC SURGERY BRIEF CONSULTATION REPORT  I was asked by the referring physician to see this patient in consult. Plans per Dr. Valeta Harms noted. Please call if we can be of further assistance  Clarence H. Roxy Manns, MD 2018/03/28 5:50pm

## 2018-04-21 NOTE — H&P (Signed)
NAME:  Anne Sanders, MRN:  [id number], DOB:  1945/11/29, LOS: 0 ADMISSION DATE:  04/16/18, CONSULTATION DATE: April 16, 2018 REFERRING MD: [facility] Dr. Tobe Sos, CHIEF COMPLAINT: Acute hypoxemic respiratory failure on BiPAP.  Brief History   73 year old female resident of local nursing facility transferred to [place] for acute hypoxemic respiratory failure.  Patient was admitted to the hospital and treated for sepsis was found to have a right-sided complex pleural disease on chest x-ray and underwent thoracentesis which revealed an LDH of 2084 and a total protein of 3.5, cultures from the pleural fluid revealed gram-positive cocci as well as blood cultures positive for gram-positive cocci in clusters.  Patient was transferred here for recommendations and management of ongoing respiratory failure.  However patient is a durable DNR.  History of present illness   This is a 73 year old female with a past medical history of DVT/PE on Eliquis, atrial fibrillation, type 2 diabetes, gout, chronic obstructive pulmonary disease, gastroesophageal reflux disease, CKD stage III and CVA.  Also has a history of dementia on Aricept.  Additional history obtained by the daughter at bedside per documentation of paperwork transferred from [place] states that she has a new diagnosis of metastatic lung cancer and a new lung mass that was recently diagnosed.  Recently being treated for bilateral pneumonia at her nursing facility.  But despite treatment patient continued to worsen aspect emergency department.  In emergency department she was found to be severely hypoxemic sats in the 70s and she was placed on nasal cannula oxygen supplementation.  Ultimately the patient was admitted to the hospital placed on Levaquin and given fluids for resuscitation.  Thoracentesis of the right-sided pleural fluid revealed an elevated LDH greater than 2000 and a protein of 3.5 and a white blood cell count of 10,000.  Cultures from the  blood as well as the pleural fluid revealed gram-positive cocci in clusters.  She was started on vancomycin, Unasyn and Zithromax.  Patient was ultimately transferred here to [facility] for further recommendations and management.  Past Medical History   Past Medical History:  Diagnosis Date  . A-fib ([place])    03/20/13; patient was unaware of diagnosis, but hx noted on her 01/22/13 PCP records (Dr. Micheal Likens); saw cardiologist Dr. Geraldo Pitter 08/2011 for afib in the setting of sepsis at [facility]  . Anemia   . Anxiety   . CHF (congestive heart failure) ([place])   . Chronic rhinitis   . COPD (chronic obstructive pulmonary disease) (H[facility])    uses 1L of O2 all the time  . Diabetes mellitus without complication ([place])   . DVT (deep vein thrombosis) in pregnancy ([place])   . GERD (gastroesophageal reflux disease)   . History of radiation therapy 04/14/13-04/21/13   lul lung/54Gy/68fx sbrt  . History of radiation therapy 09/09/14 completed   SBRT 3/3 lul lung  . Hyperlipidemia   . Hypertension   . Lung nodules   . Pneumonia   . Pulmonary embolism ([place])   . Shortness of breath    with exertion  . Sleep apnea with hypersomnolence   . Umbilical hernia      Significant Hospital Events   ICU admission 02/21/2018  Consults:  Critical care  Procedures:  03/22/2018: Right-sided thoracentesis completed at [facility]  Significant Diagnostic Tests:  Chest x-ray completed at [facility] health: Available for review in PACS - Significant right-sided pleural disease complex pleural effusion and mass within the left upper chest  Micro Data:  03/22/2018: Pleural fluid and blood cultures from [facility]  health growing gram-positive cocci in clusters  Antimicrobials:  Unasyn-[place] [facility] Azithromycin-[place] Vancomycin-[place]  Started cefepime and vancomycin here  Interim history/subjective:  Patient appears significantly agitated, relatively nonverbal to responses.  Objective   Blood pressure 121/71,  pulse (!) 114, resp. rate (!) 26, SpO2 91 %.    FiO2 (%):  [100 %] 100 %  No intake or output data in the 24 hours ending 04/16/2018 1354 There were no vitals filed for this visit.  Examination: General: Elderly female, acute respiratory distress, tachypneic HENT: NCAT, sclera clear, trachea midline, BiPAP on Lungs: Diminished breath sounds in the right, poor air entry bilaterally, no crackles, no wheeze Cardiovascular: Tachycardic, regular, S1-S2, no MRG Abdomen: Soft, nontender, small umbilical hernia Extremities: Difficult to palpate DP bilaterally, significant skin changes on lower extremities Skin: Rash within the bilateral lower intertriginous folds Neuro: Alert to voice, nonverbal response appears very anxious occasionally combative and will swing at you.  Resolved Hospital Problem list     Assessment & Plan:   Severe acute hypoxemic respiratory failure requiring maximal noninvasive positive pressure ventilation support on 100% FiO2.  This is secondary to significant bilateral pneumonia, right-sided empyema, secondary to gram-positive cocci and severe sepsis secondary to gram-positive bacteremia. -Continue BiPAP support at this time as patient is a durable DNR -We will need to continue goals of care discussion with family once we are able to reach them. -I have attempted to call them via phone and there was no answer. - We will start vancomycin and cefepime  Lactic acidosis secondary to above Leukocytosis secondary to above  History of COPD -Continue scheduled bronchodilators -We will start IV steroids  History of DVT PE on Eliquis -Hold Eliquis for the time being  History of atrial fibrillation  CKD, acute renal failure on top of CKD -We will continue to observe follow with basic metabolic panel  Hypertension Hold home blood pressure medications at this time  Hyperlipidemia Hold statin, unable to give enteral access at this time on maximal BiPAP  support.  Metastatic lung cancer Prior history of lung cancer in 2015 treated with radiation -Per documentation has a recent diagnosis of recurrence of disease.  Best practice:  Diet: N.p.o. Pain/Anxiety/Delirium protocol (if indicated): na VAP protocol (if indicated): na DVT prophylaxis: Was on Eliquis, transition to heparin subcu GI prophylaxis: H2 blocker Glucose control: SSI Mobility: Bedrest Code Status: DNR Family Communication: We will attempt to reach family again Disposition: Critically ill, poor prognosis  Labs    Lab results from [facility]: White blood cell count 8, H&H 11/35, platelets 88 Sodium 143 potassium 4.1 chloride 109 bicarb 21 BUN 75 creatinine 3.6 glucose 260  Pleural fluid analysis glucose 138, total protein 3.5, LDH 2084 pH 7.39 White blood cell count 10,772, 91% neutrophils.  Blood cultures: Positive cocci in clusters Pleural fluid cultures: Gram-positive cocci   CBC: No results for input(s): WBC, NEUTROABS, HGB, HCT, MCV, PLT in the last 168 hours.  Basic Metabolic Panel: No results for input(s): NA, K, CL, CO2, GLUCOSE, BUN, CREATININE, CALCIUM, MG, PHOS in the last 168 hours. GFR: CrCl cannot be calculated (Patient's most recent lab result is older than the maximum 21 days allowed.). No results for input(s): PROCALCITON, WBC, LATICACIDVEN in the last 168 hours.  Liver Function Tests: No results for input(s): AST, ALT, ALKPHOS, BILITOT, PROT, ALBUMIN in the last 168 hours. No results for input(s): LIPASE, AMYLASE in the last 168 hours. No results for input(s): AMMONIA in the last 168 hours.  ABG No  results found for: PHART, PCO2ART, PO2ART, HCO3, TCO2, ACIDBASEDEF, O2SAT   Coagulation Profile: No results for input(s): INR, PROTIME in the last 168 hours.  Cardiac Enzymes: No results for input(s): CKTOTAL, CKMB, CKMBINDEX, TROPONINI in the last 168 hours.  HbA1C: No results found for: HGBA1C  CBG: No results for input(s):  GLUCAP in the last 168 hours.  Review of Systems:   Unable to be obtained secondary to confusion and mental status.  Past Medical History  She,  has a past medical history of A-fib ([place]), Anemia, Anxiety, CHF (congestive heart failure) ([place]), Chronic rhinitis, COPD (chronic obstructive pulmonary disease) ([place]), Diabetes mellitus without complication ([place]), DVT (deep vein thrombosis) in pregnancy ([place]), GERD (gastroesophageal reflux disease), History of radiation therapy (04/14/13-04/21/13), History of radiation therapy (09/09/14 completed), Hyperlipidemia, Hypertension, Lung nodules, Pneumonia, Pulmonary embolism ([place]), Shortness of breath, Sleep apnea with hypersomnolence, and Umbilical hernia.   Surgical History    Past Surgical History:  Procedure Laterality Date  . BRONCHOSCOPY  11/11/12   negative pathology of right kung mass...Dr. Alcide Clever  . transbronschial biopsy  11/11/12   right lung mass...neg path..Dr Alcide Clever  . VAGINAL HYSTERECTOMY     still has both ovaries and tubes  . VIDEO BRONCHOSCOPY WITH ENDOBRONCHIAL NAVIGATION N/A 03/25/2013   Procedure: VIDEO BRONCHOSCOPY WITH ENDOBRONCHIAL NAVIGATION;  Surgeon: Grace Isaac, MD;  Location: [place];  Service: Thoracic;  Laterality: N/A;  . VIDEO BRONCHOSCOPY WITH ENDOBRONCHIAL ULTRASOUND N/A 03/25/2013   Procedure: VIDEO BRONCHOSCOPY WITH ENDOBRONCHIAL ULTRASOUND;  Surgeon: Grace Isaac, MD;  Location: [facility];  Service: Thoracic;  Laterality: N/A;     Social History   reports that she quit smoking about 6 years ago. Her smoking use included cigarettes. She started smoking about 57 years ago. She has a 45.00 pack-year smoking history. She has quit using smokeless tobacco.  Her smokeless tobacco use included snuff. She reports that she does not drink alcohol or use drugs.   Family History   Her family history includes Heart disease in her mother.   Allergies No Known Allergies   Home Medications  Prior to Admission medications    Medication Sig Start Date End Date Taking? Authorizing Provider  allopurinol (ZYLOPRIM) 100 MG tablet Take 500 mg by mouth daily.     [provider]  aspirin EC 81 MG tablet Take 81 mg by mouth daily.    [provider]  atorvastatin (LIPITOR) 80 MG tablet Take 80 mg by mouth daily.    [provider]  budesonide (PULMICORT) 0.5 MG/2ML nebulizer solution Take 0.5 mg by nebulization daily as needed (for wheezing).     [provider]  buPROPion (WELLBUTRIN SR) 150 MG 12 hr tablet Take 150 mg by mouth 2 (two) times daily.    [provider]  Cholecalciferol (VITAMIN D3) 5000 units CAPS Take 5,000 Units by mouth.    [provider]  clopidogrel (PLAVIX) 300 MG TABS tablet Take 300 mg by mouth 1 day or 1 dose.    [provider]  digoxin (LANOXIN) 0.125 MG tablet Take 0.125 mg by mouth 2 (two) times daily.     [provider]  donepezil (ARICEPT) 10 MG tablet Take 10 mg by mouth at bedtime.    [provider]  fenofibrate micronized (LOFIBRA) 200 MG capsule Take 200 mg by mouth daily before breakfast.    [provider]  fluconazole (DIFLUCAN) 100 MG tablet Take 100 mg by mouth daily.    [provider]  furosemide (LASIX) 80 MG tablet Take 40 mg by mouth 2 (two) times daily. 3 tabs 40mg  in am, 1.5 tablet of 40 mg in the evenings 02/05/15   [provider]  gabapentin (NEURONTIN) 100 MG capsule Take 100 mg by mouth 2 (two) times daily. Antibiotic started unsure name, big white pill for her infectionin her legs, sees MD Friday 11/07/13 11/03/13   [provider]  glimepiride (AMARYL) 4 MG tablet Take 4 mg by mouth 2 (two) times daily.    [provider]  insulin aspart (NOVOLOG) 100 UNIT/ML injection Inject into the skin 3 (three) times daily before meals.    [provider]  insulin NPH Human (HUMULIN N,NOVOLIN N) 100 UNIT/ML injection Inject 50 Units into the skin  daily. Increased to 90  Units daily    [provider]  ipratropium-albuterol (DUONEB) 0.5-2.5 (3) MG/3ML SOLN Take 3 mLs by nebulization every 6 (six) hours as needed (for shortness of breath).     [provider]  lisinopril (PRINIVIL,ZESTRIL) 2.5 MG tablet Take 2.5 mg by mouth 2 (two) times daily.    [provider]  LORazepam (ATIVAN) 0.5 MG tablet Take 1 tablet (0.5 mg total) by mouth once as needed for anxiety. Take 30 min prior to radiation treatment. 09/02/14   Kyung Rudd, MD  losartan (COZAAR) 25 MG tablet Take 25 mg by mouth daily.    [provider]  Melatonin 300 MCG TABS Take 1 tablet by mouth at bedtime as needed (for sleep).     [provider]  metoprolol succinate (TOPROL-XL) 100 MG 24 hr tablet Take 100 mg by mouth daily. Take with or immediately following a meal.    [provider]  Multiple Vitamin (MULTIVITAMIN) tablet Take 1 tablet by mouth daily.    [provider]  omeprazole (PRILOSEC) 20 MG capsule Take 20 mg by mouth 2 (two) times daily before a meal.    [provider]  potassium chloride SA (K-DUR,KLOR-CON) 20 MEQ tablet Take 10 mEq by mouth daily.     [provider]  pravastatin (PRAVACHOL) 80 MG tablet Take 80 mg by mouth at bedtime.    [provider]  sitaGLIPtin (JANUVIA) 50 MG tablet Take 50 mg by mouth daily.    [provider]  TRULICITY 1.5 EQ/6.8TM SOPN Take 5 mLs by mouth once a week. 02/23/15   [provider]  XARELTO 20 MG TABS tablet Take 20 mg by mouth daily.  03/26/13   [provider]     This patient is critically ill with multiple organ system failure; which, requires frequent high complexity decision making, assessment, support, evaluation, and titration of therapies. This was completed through the application of advanced monitoring technologies and extensive interpretation of multiple databases. During this encounter critical care time  was devoted to patient care services described in this note for 45 minutes.   Garner Nash, DO  [facility] 04-01-18 1:54 PM  Personal pager: [phone] If unanswered, please page CCM On-call: [phone]

## 2018-04-21 NOTE — Progress Notes (Signed)
Pharmacy Antibiotic Note  Anne Sanders is a 73 y.o. female admitted on 04/07/18 with sepsis, gpc bacteremia from [facility]  [facility]  Plan: Vanc 1500 mg x 1 during transfer Cefepime 1 g q24h Monitor renal fx cx vanc lvls prn AM vanc random Call [facility] [facility] for cx     No data recorded.  No results for input(s): WBC, CREATININE, LATICACIDVEN, VANCOTROUGH, VANCOPEAK, VANCORANDOM, GENTTROUGH, GENTPEAK, GENTRANDOM, TOBRATROUGH, TOBRAPEAK, TOBRARND, AMIKACINPEAK, AMIKACINTROU, AMIKACIN in the last 168 hours.  CrCl cannot be calculated (Patient's most recent lab result is older than the maximum 21 days allowed.).    No Known Allergies  Levester Fresh, PharmD, BCPS, BCCCP Clinical Pharmacist [phone]  Please check AMION for all [place] numbers  2018/04/07 2:53 PM

## 2018-04-21 NOTE — Progress Notes (Signed)
PCCM:  Family discussion at bedside with patient's 2 daughters.  Family is very aware of the patient's significant medical problems and comorbidities to include advanced dementia, unable to care for herself, lives in a nursing facility most of the time does not recognize who they are at this point in her advanced disease process.  We also explained her significant respiratory failure and ongoing gram-positive cocci empyema and bacteremia and the fact that she was requiring full and total NIPPV support.  At this point they would like to honor her wishes of being a durable DNR and would prefer to make her comfortable.  They would like to wait on their brother to get to the hospital as well as the family's pastor.  I will place comfort care orders and notify nursing staff.  Garner Nash, DO [facility] 04/13/2018 5:23 PM  Personal pager: [phone] If unanswered, please page CCM On-call: [phone]

## 2018-04-21 DEATH — deceased
